# Patient Record
Sex: Female | Born: 1970
Health system: Southern US, Community
[De-identification: ages and names within clinical notes are randomized; demographics above are authoritative.]

## PROBLEM LIST (undated history)

## (undated) DIAGNOSIS — F32A Depression, unspecified: Secondary | ICD-10-CM

## (undated) DIAGNOSIS — F329 Major depressive disorder, single episode, unspecified: Secondary | ICD-10-CM

## (undated) DIAGNOSIS — F419 Anxiety disorder, unspecified: Secondary | ICD-10-CM

## (undated) DIAGNOSIS — K219 Gastro-esophageal reflux disease without esophagitis: Secondary | ICD-10-CM

## (undated) DIAGNOSIS — O139 Gestational [pregnancy-induced] hypertension without significant proteinuria, unspecified trimester: Secondary | ICD-10-CM

## (undated) DIAGNOSIS — N879 Dysplasia of cervix uteri, unspecified: Secondary | ICD-10-CM

## (undated) DIAGNOSIS — A64 Unspecified sexually transmitted disease: Secondary | ICD-10-CM

## (undated) DIAGNOSIS — S0990XA Unspecified injury of head, initial encounter: Secondary | ICD-10-CM

## (undated) DIAGNOSIS — B029 Zoster without complications: Secondary | ICD-10-CM

## (undated) DIAGNOSIS — N924 Excessive bleeding in the premenopausal period: Secondary | ICD-10-CM

## (undated) DIAGNOSIS — M069 Rheumatoid arthritis, unspecified: Secondary | ICD-10-CM

## (undated) DIAGNOSIS — G40909 Epilepsy, unspecified, not intractable, without status epilepticus: Secondary | ICD-10-CM

## (undated) HISTORY — DX: Unspecified injury of head, initial encounter: S09.90XA

## (undated) HISTORY — DX: Zoster without complications: B02.9

## (undated) HISTORY — PX: WISDOM TOOTH EXTRACTION: SHX21

## (undated) HISTORY — DX: Rheumatoid arthritis, unspecified: M06.9

## (undated) HISTORY — PX: BICEPS TENDON REPAIR: SHX566

## (undated) HISTORY — DX: Gastro-esophageal reflux disease without esophagitis: K21.9

## (undated) HISTORY — DX: Anxiety disorder, unspecified: F41.9

## (undated) HISTORY — DX: Unspecified sexually transmitted disease: A64

## (undated) HISTORY — PX: SHOULDER SURGERY: SHX246

## (undated) HISTORY — DX: Dysplasia of cervix uteri, unspecified: N87.9

---

## 1997-10-25 ENCOUNTER — Emergency Department (HOSPITAL_COMMUNITY): Admission: EM | Admit: 1997-10-25 | Discharge: 1997-10-25 | Payer: Self-pay | Admitting: Emergency Medicine

## 1998-01-07 ENCOUNTER — Inpatient Hospital Stay (HOSPITAL_COMMUNITY): Admission: AD | Admit: 1998-01-07 | Discharge: 1998-01-07 | Payer: Self-pay | Admitting: *Deleted

## 1998-01-14 ENCOUNTER — Inpatient Hospital Stay (HOSPITAL_COMMUNITY): Admission: AD | Admit: 1998-01-14 | Discharge: 1998-01-14 | Payer: Self-pay | Admitting: *Deleted

## 1998-09-22 ENCOUNTER — Encounter: Payer: Self-pay | Admitting: Emergency Medicine

## 1998-09-22 ENCOUNTER — Emergency Department (HOSPITAL_COMMUNITY): Admission: EM | Admit: 1998-09-22 | Discharge: 1998-09-22 | Payer: Self-pay | Admitting: Emergency Medicine

## 1999-06-03 ENCOUNTER — Encounter: Payer: Self-pay | Admitting: Emergency Medicine

## 1999-06-03 ENCOUNTER — Emergency Department (HOSPITAL_COMMUNITY): Admission: EM | Admit: 1999-06-03 | Discharge: 1999-06-03 | Payer: Self-pay | Admitting: Emergency Medicine

## 2001-05-21 ENCOUNTER — Emergency Department (HOSPITAL_COMMUNITY): Admission: EM | Admit: 2001-05-21 | Discharge: 2001-05-21 | Payer: Self-pay | Admitting: Emergency Medicine

## 2001-06-19 ENCOUNTER — Emergency Department (HOSPITAL_COMMUNITY): Admission: EM | Admit: 2001-06-19 | Discharge: 2001-06-19 | Payer: Self-pay | Admitting: Emergency Medicine

## 2002-08-16 ENCOUNTER — Emergency Department (HOSPITAL_COMMUNITY): Admission: EM | Admit: 2002-08-16 | Discharge: 2002-08-16 | Payer: Self-pay | Admitting: Emergency Medicine

## 2002-10-30 ENCOUNTER — Encounter: Payer: Self-pay | Admitting: Emergency Medicine

## 2002-10-30 ENCOUNTER — Emergency Department (HOSPITAL_COMMUNITY): Admission: EM | Admit: 2002-10-30 | Discharge: 2002-10-30 | Payer: Self-pay | Admitting: Emergency Medicine

## 2004-07-17 ENCOUNTER — Inpatient Hospital Stay (HOSPITAL_COMMUNITY): Admission: AD | Admit: 2004-07-17 | Discharge: 2004-07-17 | Payer: Self-pay | Admitting: *Deleted

## 2005-02-16 ENCOUNTER — Emergency Department (HOSPITAL_COMMUNITY): Admission: EM | Admit: 2005-02-16 | Discharge: 2005-02-16 | Payer: Self-pay | Admitting: Emergency Medicine

## 2007-01-21 ENCOUNTER — Inpatient Hospital Stay (HOSPITAL_COMMUNITY): Admission: AD | Admit: 2007-01-21 | Discharge: 2007-01-21 | Payer: Self-pay | Admitting: Family Medicine

## 2007-08-13 ENCOUNTER — Emergency Department (HOSPITAL_COMMUNITY): Admission: EM | Admit: 2007-08-13 | Discharge: 2007-08-13 | Payer: Self-pay | Admitting: Family Medicine

## 2008-05-17 ENCOUNTER — Emergency Department (HOSPITAL_COMMUNITY): Admission: EM | Admit: 2008-05-17 | Discharge: 2008-05-17 | Payer: Self-pay | Admitting: Emergency Medicine

## 2008-05-20 ENCOUNTER — Encounter: Admission: RE | Admit: 2008-05-20 | Discharge: 2008-05-20 | Payer: Self-pay | Admitting: Emergency Medicine

## 2008-07-17 ENCOUNTER — Emergency Department (HOSPITAL_COMMUNITY): Admission: EM | Admit: 2008-07-17 | Discharge: 2008-07-17 | Payer: Self-pay | Admitting: Emergency Medicine

## 2008-09-26 ENCOUNTER — Emergency Department (HOSPITAL_COMMUNITY): Admission: EM | Admit: 2008-09-26 | Discharge: 2008-09-26 | Payer: Self-pay | Admitting: Family Medicine

## 2010-04-23 ENCOUNTER — Encounter: Payer: Self-pay | Admitting: Emergency Medicine

## 2010-04-24 ENCOUNTER — Emergency Department (HOSPITAL_COMMUNITY)
Admission: EM | Admit: 2010-04-24 | Discharge: 2010-04-24 | Payer: Self-pay | Source: Home / Self Care | Admitting: Family Medicine

## 2010-04-24 ENCOUNTER — Encounter: Payer: Self-pay | Admitting: Family Medicine

## 2010-04-24 ENCOUNTER — Inpatient Hospital Stay (HOSPITAL_COMMUNITY)
Admission: AD | Admit: 2010-04-24 | Discharge: 2010-04-25 | Payer: Self-pay | Attending: Family Medicine | Admitting: Family Medicine

## 2010-04-25 LAB — DIFFERENTIAL
Lymphs Abs: 2.1 10*3/uL (ref 0.7–4.0)
Monocytes Absolute: 0.5 10*3/uL (ref 0.1–1.0)
Monocytes Relative: 8 % (ref 3–12)
Neutro Abs: 3.3 10*3/uL (ref 1.7–7.7)
Neutrophils Relative %: 55 % (ref 43–77)

## 2010-04-25 LAB — POCT URINALYSIS DIPSTICK
Bilirubin Urine: NEGATIVE
Specific Gravity, Urine: 1.02 (ref 1.005–1.030)
Urine Glucose, Fasting: NEGATIVE mg/dL
pH: 5 (ref 5.0–8.0)

## 2010-04-25 LAB — CBC
HCT: 36 % (ref 36.0–46.0)
Hemoglobin: 12.4 g/dL (ref 12.0–15.0)
MCH: 29.7 pg (ref 26.0–34.0)
MCHC: 34.4 g/dL (ref 30.0–36.0)
MCV: 86.3 fL (ref 78.0–100.0)
RBC: 4.17 MIL/uL (ref 3.87–5.11)

## 2010-04-25 LAB — WET PREP, GENITAL

## 2010-04-26 LAB — GC/CHLAMYDIA PROBE AMP, GENITAL
Chlamydia, DNA Probe: NEGATIVE
GC Probe Amp, Genital: NEGATIVE

## 2010-04-28 ENCOUNTER — Inpatient Hospital Stay (HOSPITAL_COMMUNITY)
Admission: AD | Admit: 2010-04-28 | Discharge: 2010-04-28 | Payer: Self-pay | Source: Home / Self Care | Attending: Obstetrics & Gynecology | Admitting: Obstetrics & Gynecology

## 2010-05-19 ENCOUNTER — Encounter (INDEPENDENT_AMBULATORY_CARE_PROVIDER_SITE_OTHER): Payer: Self-pay | Admitting: Obstetrics & Gynecology

## 2010-05-19 ENCOUNTER — Other Ambulatory Visit: Payer: Self-pay | Admitting: Obstetrics & Gynecology

## 2010-05-19 ENCOUNTER — Encounter: Payer: Self-pay | Admitting: Obstetrics & Gynecology

## 2010-05-19 DIAGNOSIS — D259 Leiomyoma of uterus, unspecified: Secondary | ICD-10-CM

## 2010-05-19 DIAGNOSIS — Z124 Encounter for screening for malignant neoplasm of cervix: Secondary | ICD-10-CM

## 2010-05-19 LAB — CONVERTED CEMR LAB
Trich, Wet Prep: NONE SEEN
Yeast Wet Prep HPF POC: NONE SEEN

## 2010-05-25 ENCOUNTER — Encounter: Payer: Self-pay | Admitting: Obstetrics and Gynecology

## 2010-06-23 ENCOUNTER — Ambulatory Visit (INDEPENDENT_AMBULATORY_CARE_PROVIDER_SITE_OTHER): Payer: Self-pay | Admitting: Obstetrics & Gynecology

## 2010-06-23 DIAGNOSIS — D259 Leiomyoma of uterus, unspecified: Secondary | ICD-10-CM

## 2010-06-23 NOTE — Progress Notes (Signed)
Patricia [name], [name]             ACCOUNT NO.:  [id number]  MEDICAL RECORD NO.:  [id number]           PATIENT TYPE:  A  LOCATION:  [facility]                   FACILITY:  [facility]  PHYSICIAN:  Allie Bossier, MD        DATE OF BIRTH:  1970/05/13  DATE OF SERVICE:  05/19/2010                                 CLINIC NOTE  PRIMARY REASON:  History of fibroids and abdominal pain.  HISTORY OF PRESENT ILLNESS:  [name] is a 41 year old G4, P4 with a past medical history that is significant for bilateral tubal C-section who is coming in with was abdominal pain.  The patient back in January 2011 did have an ultrasound done that did show that the patient had 4 focal fibroids measuring with 1.9 cm as the largest one in that as well as some diffuse heterogeneity throughout the uterus suggestive of adenomyosis.  The patient was in [facility] where she was given Percocet back in January as well as at that time, since then the patient has been doing very good.  The patient's history goes back several months saying that every month she is actually regular, still 28 days or so with bleeding for 3-5 days usually regular, no heavy bleeding.  The patient does says that her abdominal pain usually starts 4 days prior to her menstruation and has been having this discomfort for a while.  The patient was given some Motrin back in 600 mg tabs back in January and admits this has helped significantly.  The patient denies any type of fevers, chills, nausea, vomiting, any significant blood loss, dysuria, constipation, shortness of breath, or chest pain on review of systems.  PAST MEDICAL HISTORY:  History of fibroids.  OBSTETRICAL HISTORY:  The patient does have a history of Trichomonas back in 2010 as well as gonorrhea back in 2010, pregnancy-induced hypertension, history of abnormal Pap smear that the patient does not remember, but did have cryosurgery for it.  PAST SURGICAL HISTORY:  Significant for cesarean  section as well as bilateral tubal ligation.  SOCIAL HISTORY:  No drinking, former smoker, actually now smoking about half pack per day and cannabis abuse.  PHYSICAL EXAMINATION:  VITAL SIGNS:  Temperature of 98.7, pulse of 80, blood pressure 121/78, weight of 135 pounds, height 63 inches. GENERAL:  In no apparent distress.  Alert and oriented x3. HEENT:  Pupils equal, round, and reactive to light.  Extraocular movements intact. LUNGS:  Clear to auscultation bilaterally. HEART:  Regular rate and rhythm.  No murmurs. ABDOMEN:  Nontender.  No masses felt.  Bowel sounds present. BREASTS:  The patient does has a very small, less than 1-cm cyst felt in the 5 o'clock position.  Nontender, but mobile.  The patient has had this she states for approximately 2 years.  Had a mammogram done at that time, which was regular and routine mammogram testing is necessary from now. PELVIC:  Normal external female genitalia.  Vaginal vault does show yeast with white conization of the sidewalls, mild conization of the cervix, cervix seen.  Healthy pink tissue, no bleeding, Pap collected at that time. EXTREMITIES:  2+ distal pulses.  No  edema.  ASSESSMENT/PLAN:  [name] is a 40 year old G4, P4 with a history of fibroids causing some mild abdominal pain. 1. Fibroids.  The patient is actually well controlled with ibuprofen     and was given different plans and options at this time.  The     patient was given the option of a possible oral contraceptive to     help with this and the patient declined at this time and will do     the ibuprofen.  The patient was given red flags and what to look     for and will seek medical attention otherwise.  She will follow up     as necessary for this exam. 2. Yeast infection.  The patient was given Diflucan 150 mg x1 to take     care of that. 3. Tobacco abuse.  The patient was urged to quit smoking because there     could be increased risk of cervical as well as  endometrial cancer.     The patient stated that motivation was enough for her to quit. 4. Left-sided breast cyst.  The patient is scheduled for a mammogram,     this seems to be very benign in nature.  No red flags such as     weight loss or anything else other than her nipple discharge.  Due     to the patient not having any insurance, we will give the patient     medical cautions and give the patient a free mammogram.  FOLLOWUP APPOINTMENTS:  The patient is going to follow up as needed for any other problems involved.    ______________________________ Myrlene Broker   ______________________________ Allie Bossier, MD   LS/MEDQ  D:  05/19/2010  T:  05/20/2010  Job:  [id number]

## 2010-06-24 NOTE — Progress Notes (Signed)
NAMELATORSHA, CURLING             ACCOUNT NO.:  [id number]  MEDICAL RECORD NO.:  [id number]           PATIENT TYPE:  A  LOCATION:  [facility]                   FACILITY:  [facility]  PHYSICIAN:  Allie Bossier, MD        DATE OF BIRTH:  1971/03/07  DATE OF SERVICE:  06/23/2010                                 CLINIC NOTE  Patricia [name] is a 40 year old G4, P4 who was seen here last month for annual exam.  At that time, she complained of a "sticky, occasionally white vaginal discharge."  She denies any odor or associated pruritus. At the time, she assumed that she had a yeast infection.  A wet prep was done at that time and it was normal.  Her Pap smear has returned as normal.  I have explained to her the typical vaginal discharge as well as what vaginal discharge might be considered pathologic.  She is relieved and will plan on coming back here in a year for her next annual exam or sooner as necessary.     Allie Bossier, MD    MCD/MEDQ  D:  06/23/2010  T:  06/24/2010  Job:  [id number]

## 2010-06-29 ENCOUNTER — Ambulatory Visit: Payer: Self-pay | Admitting: Obstetrics & Gynecology

## 2010-08-25 ENCOUNTER — Other Ambulatory Visit: Payer: Self-pay | Admitting: Obstetrics & Gynecology

## 2010-08-25 DIAGNOSIS — Z1231 Encounter for screening mammogram for malignant neoplasm of breast: Secondary | ICD-10-CM

## 2010-09-05 ENCOUNTER — Ambulatory Visit (HOSPITAL_COMMUNITY)
Admission: RE | Admit: 2010-09-05 | Discharge: 2010-09-05 | Disposition: A | Discharge status: Home / Self Care | Payer: Self-pay | Source: Ambulatory Visit | Attending: Obstetrics & Gynecology | Admitting: Obstetrics & Gynecology

## 2010-09-05 DIAGNOSIS — Z1231 Encounter for screening mammogram for malignant neoplasm of breast: Secondary | ICD-10-CM

## 2011-01-10 LAB — DIFFERENTIAL
Basophils Absolute: 0
Basophils Relative: 1
Monocytes Relative: 9
Neutro Abs: 2.9
Neutrophils Relative %: 50

## 2011-01-10 LAB — URINALYSIS, ROUTINE W REFLEX MICROSCOPIC
Bilirubin Urine: NEGATIVE
Glucose, UA: NEGATIVE
Ketones, ur: NEGATIVE
Protein, ur: NEGATIVE

## 2011-01-10 LAB — GC/CHLAMYDIA PROBE AMP, GENITAL: Chlamydia, DNA Probe: NEGATIVE

## 2011-01-10 LAB — CBC
MCHC: 34.9
RBC: 4.07

## 2011-01-10 LAB — POCT PREGNANCY, URINE
Operator id: 113551
Preg Test, Ur: NEGATIVE

## 2011-01-10 LAB — URINE MICROSCOPIC-ADD ON

## 2011-01-10 LAB — WET PREP, GENITAL: Yeast Wet Prep HPF POC: NONE SEEN

## 2011-05-02 ENCOUNTER — Encounter (HOSPITAL_COMMUNITY): Payer: Self-pay

## 2011-05-02 ENCOUNTER — Inpatient Hospital Stay (HOSPITAL_COMMUNITY)
Admission: AD | Admit: 2011-05-02 | Discharge: 2011-05-02 | Disposition: A | Discharge status: Home / Self Care | Payer: Self-pay | Source: Ambulatory Visit | Attending: Obstetrics and Gynecology | Admitting: Obstetrics and Gynecology

## 2011-05-02 ENCOUNTER — Inpatient Hospital Stay (HOSPITAL_COMMUNITY): Payer: Self-pay

## 2011-05-02 DIAGNOSIS — D259 Leiomyoma of uterus, unspecified: Secondary | ICD-10-CM | POA: Insufficient documentation

## 2011-05-02 DIAGNOSIS — R102 Pelvic and perineal pain: Secondary | ICD-10-CM

## 2011-05-02 DIAGNOSIS — D219 Benign neoplasm of connective and other soft tissue, unspecified: Secondary | ICD-10-CM

## 2011-05-02 DIAGNOSIS — R1031 Right lower quadrant pain: Secondary | ICD-10-CM | POA: Insufficient documentation

## 2011-05-02 HISTORY — DX: Epilepsy, unspecified, not intractable, without status epilepticus: G40.909

## 2011-05-02 HISTORY — DX: Gestational (pregnancy-induced) hypertension without significant proteinuria, unspecified trimester: O13.9

## 2011-05-02 LAB — CBC
HCT: 37.4 % (ref 36.0–46.0)
Hemoglobin: 12.7 g/dL (ref 12.0–15.0)
MCH: 30 pg (ref 26.0–34.0)
MCHC: 34 g/dL (ref 30.0–36.0)
MCV: 88.2 fL (ref 78.0–100.0)
RDW: 12.5 % (ref 11.5–15.5)

## 2011-05-02 LAB — URINALYSIS, ROUTINE W REFLEX MICROSCOPIC
Bilirubin Urine: NEGATIVE
Glucose, UA: NEGATIVE mg/dL
Ketones, ur: NEGATIVE mg/dL
pH: 5.5 (ref 5.0–8.0)

## 2011-05-02 LAB — URINE MICROSCOPIC-ADD ON

## 2011-05-02 LAB — WET PREP, GENITAL

## 2011-05-02 MED ORDER — NAPROXEN SODIUM 550 MG PO TABS: 550.0000 mg | ORAL_TABLET | Freq: Two times a day (BID) | ORAL | Status: DC

## 2011-05-02 MED ORDER — KETOROLAC TROMETHAMINE 60 MG/2ML IM SOLN
60.0000 mg | Freq: Once | INTRAMUSCULAR | Status: AC
  Administered 2011-05-02: 60 mg via INTRAMUSCULAR
  Filled 2011-05-02: qty 2

## 2011-05-02 NOTE — ED Provider Notes (Signed)
History   Pt presents today c/o RLQ and lower abd pain that has worsened over the past 4 days. She denies fever, dysuria. Her last episode of intercourse was 4 days ago and was painful. She also reports vag spotting that began about 2 days ago.  Chief Complaint  Patient presents with  . Abdominal Pain   HPI  OB History    Grav Para Term Preterm Abortions TAB SAB Ect Mult Living   4 4 3 1      4       Past Medical History  Diagnosis Date  . Seizure disorder   . Pregnancy induced hypertension     Past Surgical History  Procedure Date  . Cesarean section   . Wisdom tooth extraction     Family History  Problem Relation Age of Onset  . Anesthesia problems Neg Hx     History  Substance Use Topics  . Smoking status: Former Games developer  . Smokeless tobacco: Never Used  . Alcohol Use: No    Allergies: No Known Allergies  Prescriptions prior to admission  Medication Sig Dispense Refill  . Multiple Vitamin (MULITIVITAMIN WITH MINERALS) TABS Take 1 tablet by mouth daily.      Marland Kitchen OVER THE COUNTER MEDICATION Take 1 tablet by mouth daily as needed. Patients said she was taken a pain reliever        Review of Systems  Constitutional: Negative for fever.  Eyes: Negative for blurred vision and double vision.  Respiratory: Negative for cough, hemoptysis, sputum production, shortness of breath and wheezing.   Cardiovascular: Negative for chest pain and palpitations.  Gastrointestinal: Positive for abdominal pain. Negative for nausea, vomiting, diarrhea and constipation.  Genitourinary: Negative for dysuria, urgency, frequency and hematuria.  Neurological: Negative for dizziness and headaches.  Psychiatric/Behavioral: Negative for depression and suicidal ideas.   Physical Exam   Blood pressure 143/87, pulse 78, temperature 97.9 F (36.6 C), temperature source Oral, resp. rate 20, last menstrual period 04/06/2011.  Physical Exam  Nursing note and vitals reviewed. Constitutional:  She is oriented to person, place, and time. She appears well-developed and well-nourished. No distress.  HENT:  Head: Normocephalic and atraumatic.  Eyes: EOM are normal. Pupils are equal, round, and reactive to light.  GI: Soft. She exhibits no distension and no mass. There is tenderness (Tenderness to both right and left lower quadrants.). There is guarding. There is no rebound.  Genitourinary: There is tenderness and bleeding around the vagina. Vaginal discharge found.       Uterus top normal size. Pt tender to both adnexa. No obvious masses but exam difficult secondary to pt discomfort.  Neurological: She is alert and oriented to person, place, and time.  Skin: Skin is warm and dry. She is not diaphoretic.  Psychiatric: She has a normal mood and affect. Her behavior is normal. Judgment and thought content normal.    MAU Course  Procedures  Wet prep and GC/Chlamydia cultures done.  Results for orders placed during the hospital encounter of 05/02/11 (from the past 24 hour(s))  URINALYSIS, ROUTINE W REFLEX MICROSCOPIC     Status: Abnormal   Collection Time   05/02/11  7:30 AM      Component Value Range   Color, Urine YELLOW  YELLOW    APPearance CLEAR  CLEAR    Specific Gravity, Urine >1.030 (*) 1.005 - 1.030    pH 5.5  5.0 - 8.0    Glucose, UA NEGATIVE  NEGATIVE (mg/dL)   Hgb urine  dipstick MODERATE (*) NEGATIVE    Bilirubin Urine NEGATIVE  NEGATIVE    Ketones, ur NEGATIVE  NEGATIVE (mg/dL)   Protein, ur NEGATIVE  NEGATIVE (mg/dL)   Urobilinogen, UA 0.2  0.0 - 1.0 (mg/dL)   Nitrite NEGATIVE  NEGATIVE    Leukocytes, UA NEGATIVE  NEGATIVE   URINE MICROSCOPIC-ADD ON     Status: Normal   Collection Time   05/02/11  7:30 AM      Component Value Range   Squamous Epithelial / LPF RARE  RARE    WBC, UA 0-2  <3 (WBC/hpf)   RBC / HPF 0-2  <3 (RBC/hpf)  POCT PREGNANCY, URINE     Status: Normal   Collection Time   05/02/11  7:39 AM      Component Value Range   Preg Test, Ur NEGATIVE   NEGATIVE   WET PREP, GENITAL     Status: Abnormal   Collection Time   05/02/11  8:05 AM      Component Value Range   Yeast Wet Prep HPF POC NONE SEEN  NONE SEEN    Trich, Wet Prep NONE SEEN  NONE SEEN    Clue Cells Wet Prep HPF POC MANY (*) NONE SEEN    WBC, Wet Prep HPF POC FEW (*) NONE SEEN   CBC     Status: Normal   Collection Time   05/02/11  9:10 AM      Component Value Range   WBC 4.4  4.0 - 10.5 (K/uL)   RBC 4.24  3.87 - 5.11 (MIL/uL)   Hemoglobin 12.7  12.0 - 15.0 (g/dL)   HCT 16.1  09.6 - 04.5 (%)   MCV 88.2  78.0 - 100.0 (fL)   MCH 30.0  26.0 - 34.0 (pg)   MCHC 34.0  30.0 - 36.0 (g/dL)   RDW 40.9  81.1 - 91.4 (%)   Platelets 264  150 - 400 (K/uL)   US Transvaginal Non-ob  05/02/2011  *RADIOLOGY REPORT*  Clinical Data: Pelvic pain.  The fibroids.  Negative pregnancy test.  TRANSABDOMINAL AND TRANSVAGINAL ULTRASOUND OF PELVIS  Technique:  Both transabdominal and transvaginal ultrasound examinations of the pelvis were performed.  Transabdominal technique was performed for global imaging of the pelvis including uterus, ovaries, adnexal regions, and pelvic cul-de-sac.  It was necessary to proceed with endovaginal exam following the transabdominal exam to visualize the individual fibroids, the endometrial stripe, and left ovary.  Comparison:  04/24/2010  Findings: Uterus:  9.9 x 5.1 x 5.5 cm.  At least five separate fibroids are demonstrated, which range in size from the 1.0 cm to 2.1 cm in maximum diameter. These fibroids show no significant change in size since previous study.  Two of these fibroids in the anterior fundus and mid posterior body have partial submucosal components which indent the endometrial stripe.  Endometrium: Measures 4 mm in thickness transvaginally.  Right ovary: 3.0 x 1.8 x 1.9 cm.  Normal appearance.  Left ovary: 3.4 x 1.4 x 1.9 cm.  Normal appearance.  Other Findings:  No free fluid  IMPRESSION:  1.  Multiple small uterine fibroids measuring up to 2 cm.  No  significant change seen compared with prior exam. 2.  Normal ovaries.  No evidence of adnexal mass or free fluid.  Original Report Authenticated By: Danae Orleans, M.D.   US Pelvis Complete  05/02/2011  *RADIOLOGY REPORT*  Clinical Data: Pelvic pain.  The fibroids.  Negative pregnancy test.  TRANSABDOMINAL AND TRANSVAGINAL ULTRASOUND OF PELVIS  Technique:  Both transabdominal and transvaginal ultrasound examinations of the pelvis were performed.  Transabdominal technique was performed for global imaging of the pelvis including uterus, ovaries, adnexal regions, and pelvic cul-de-sac.  It was necessary to proceed with endovaginal exam following the transabdominal exam to visualize the individual fibroids, the endometrial stripe, and left ovary.  Comparison:  04/24/2010  Findings: Uterus:  9.9 x 5.1 x 5.5 cm.  At least five separate fibroids are demonstrated, which range in size from the 1.0 cm to 2.1 cm in maximum diameter. These fibroids show no significant change in size since previous study.  Two of these fibroids in the anterior fundus and mid posterior body have partial submucosal components which indent the endometrial stripe.  Endometrium: Measures 4 mm in thickness transvaginally.  Right ovary: 3.0 x 1.8 x 1.9 cm.  Normal appearance.  Left ovary: 3.4 x 1.4 x 1.9 cm.  Normal appearance.  Other Findings:  No free fluid  IMPRESSION:  1.  Multiple small uterine fibroids measuring up to 2 cm.  No significant change seen compared with prior exam. 2.  Normal ovaries.  No evidence of adnexal mass or free fluid.  Original Report Authenticated By: Danae Orleans, M.D.   Pt pain is much improved following IM Toradol.  Assessment and Plan  Abd pain: discussed with pt at length. Pt likely having menses at this time. Will give Rx for anaprox ds. Discussed diet, activity, risks, and precautions.   Fibroids: discussed with pt at length. Pt understands that no tx is needed at this time and these may never give her any  problems. However, she is now aware. She will f/u with her PCP.  Clinton Gallant. Arn Mcomber III, DrHSc, MPAS, PA-C  05/02/2011, 8:09 AM   Henrietta Hoover, PA 05/02/11 0930

## 2011-05-02 NOTE — Progress Notes (Signed)
Patient states she has been having right lower abdominal pain for 4 days, sometimes radiates to the back on the right side. Has had spotting when wiping after urinating for two days.

## 2011-05-02 NOTE — Progress Notes (Signed)
Pt states no dysuria, has noted blood when she wipes x2 days, Denies abnormal vaginal d/c changes. LMP-04/06/2011, menstrual cycle prior was abnormal as well as January's. Mild CVA tenderness on r side.

## 2011-05-03 LAB — GC/CHLAMYDIA PROBE AMP, GENITAL: Chlamydia, DNA Probe: NEGATIVE

## 2011-05-04 NOTE — ED Provider Notes (Signed)
Agree with above note.  Patricia [name] 05/04/2011 5:48 AM

## 2011-06-06 ENCOUNTER — Encounter: Payer: Self-pay | Admitting: Obstetrics & Gynecology

## 2011-11-10 ENCOUNTER — Emergency Department (HOSPITAL_COMMUNITY)
Admission: EM | Admit: 2011-11-10 | Discharge: 2011-11-10 | Disposition: A | Discharge status: Home / Self Care | Payer: Self-pay | Attending: Emergency Medicine | Admitting: Emergency Medicine

## 2011-11-10 ENCOUNTER — Encounter (HOSPITAL_COMMUNITY): Payer: Self-pay | Admitting: Emergency Medicine

## 2011-11-10 DIAGNOSIS — R51 Headache: Secondary | ICD-10-CM | POA: Insufficient documentation

## 2011-11-10 DIAGNOSIS — F419 Anxiety disorder, unspecified: Secondary | ICD-10-CM

## 2011-11-10 DIAGNOSIS — Z87891 Personal history of nicotine dependence: Secondary | ICD-10-CM | POA: Insufficient documentation

## 2011-11-10 DIAGNOSIS — G40909 Epilepsy, unspecified, not intractable, without status epilepticus: Secondary | ICD-10-CM | POA: Insufficient documentation

## 2011-11-10 DIAGNOSIS — F411 Generalized anxiety disorder: Secondary | ICD-10-CM | POA: Insufficient documentation

## 2011-11-10 LAB — COMPREHENSIVE METABOLIC PANEL
ALT: 19 U/L (ref 0–35)
Albumin: 3.9 g/dL (ref 3.5–5.2)
Alkaline Phosphatase: 91 U/L (ref 39–117)
Calcium: 9.8 mg/dL (ref 8.4–10.5)
GFR calc Af Amer: >90 mL/min (ref >90)
Glucose, Bld: 103 mg/dL — ABNORMAL HIGH (ref 70–99)
Potassium: 3.9 mEq/L (ref 3.5–5.1)
Sodium: 137 mEq/L (ref 135–145)
Total Protein: 7.4 g/dL (ref 6.0–8.3)

## 2011-11-10 LAB — CBC WITH DIFFERENTIAL/PLATELET
Basophils Relative: 0 % (ref 0–1)
Eosinophils Absolute: 0.1 10*3/uL (ref 0.0–0.7)
Eosinophils Relative: 1 % (ref 0–5)
Lymphs Abs: 1.8 10*3/uL (ref 0.7–4.0)
MCH: 29.1 pg (ref 26.0–34.0)
MCHC: 33.5 g/dL (ref 30.0–36.0)
MCV: 86.8 fL (ref 78.0–100.0)
Neutrophils Relative %: 64 % (ref 43–77)
Platelets: 357 10*3/uL (ref 150–400)
RDW: 12.5 % (ref 11.5–15.5)

## 2011-11-10 MED ORDER — DIPHENHYDRAMINE HCL 50 MG/ML IJ SOLN
25.0000 mg | Freq: Once | INTRAMUSCULAR | Status: AC
  Administered 2011-11-10: 25 mg via INTRAVENOUS
  Filled 2011-11-10: qty 1

## 2011-11-10 MED ORDER — SODIUM CHLORIDE 0.9 % IV BOLUS (SEPSIS)
1000.0000 mL | Freq: Once | INTRAVENOUS | Status: AC
  Administered 2011-11-10: 1000 mL via INTRAVENOUS

## 2011-11-10 MED ORDER — METOCLOPRAMIDE HCL 5 MG/ML IJ SOLN
10.0000 mg | Freq: Once | INTRAMUSCULAR | Status: AC
  Administered 2011-11-10: 10 mg via INTRAVENOUS
  Filled 2011-11-10: qty 2

## 2011-11-10 NOTE — ED Provider Notes (Signed)
History     CSN: [id number]  Arrival date & time 11/10/11  1956   First MD Initiated Contact with Patient 11/10/11 2050      Chief Complaint  Patient presents with  . Headache    (Consider location/radiation/quality/duration/timing/severity/associated sxs/prior treatment) HPI Comments: Patricia [name] is a 41 y.o. Female otherwise healthy here presenting with headache and anxiety and diaphoresis. She states that she didn't have a job for a while and has been worried about getting a job. Since yesterday she feels that she is very sweaty and her blood pressure was elevated. She also has some diffuse headache and feels nauseous. Denies any blurry vision or vomiting. Denies any neck pain or fever. She also feels like she was going to pass out but didn't pass out. Not on OCP. No family hx of DVT/PE.    The history is provided by the patient and a friend.    Past Medical History  Diagnosis Date  . Seizure disorder   . Pregnancy induced hypertension     Past Surgical History  Procedure Date  . Cesarean section   . Wisdom tooth extraction     Family History  Problem Relation Age of Onset  . Anesthesia problems Neg Hx     History  Substance Use Topics  . Smoking status: Former Games developer  . Smokeless tobacco: Never Used  . Alcohol Use: No    OB History    Grav Para Term Preterm Abortions TAB SAB Ect Mult Living   4 4 3 1      4       Review of Systems  Neurological: Positive for headaches.       Lightheadedness  All other systems reviewed and are negative.    Allergies  Review of patient's allergies indicates no known allergies.  Home Medications   Current Outpatient Rx  Name Route Sig Dispense Refill  . ADULT MULTIVITAMIN W/MINERALS CH Oral Take 1 tablet by mouth daily.    Marland Kitchen NAPROXEN SODIUM 220 MG PO TABS Oral Take 220 mg by mouth 2 (two) times daily with a meal.      BP 143/89  Pulse 101  Temp 98.8 F (37.1 C) (Oral)  Resp 16  Ht 5\' 5"  (1.651 m)  Wt  150 lb (68.04 kg)  BMI 24.96 kg/m2  SpO2 100%  Physical Exam  Constitutional: She is oriented to person, place, and time.       Anxious, tearful.   HENT:  Head: Normocephalic.  Mouth/Throat: Oropharynx is clear and moist.  Eyes: Conjunctivae and EOM are normal. Pupils are equal, round, and reactive to light.  Neck: Normal range of motion. Neck supple.       No meningeal signs   Cardiovascular: Normal rate, regular rhythm and normal heart sounds.   Pulmonary/Chest: Effort normal and breath sounds normal.  Abdominal: Soft. Bowel sounds are normal.  Musculoskeletal: Normal range of motion.  Neurological: She is alert and oriented to person, place, and time.  Skin: Skin is warm.  Psychiatric: She has a normal mood and affect. Her behavior is normal. Judgment and thought content normal.    ED Course  Procedures (including critical care time)  Labs Reviewed  COMPREHENSIVE METABOLIC PANEL - Abnormal; Notable for the following:    Glucose, Bld 103 (*)     Total Bilirubin 0.2 (*)     All other components within normal limits  CBC WITH DIFFERENTIAL   No results found.   No diagnosis found.   Date:  11/10/2011  Rate: 83  Rhythm: normal sinus rhythm  QRS Axis: normal  Intervals: normal  ST/T Wave abnormalities: normal  Conduction Disutrbances:none  Narrative Interpretation:   Old EKG Reviewed: unchanged    MDM  Patricia [name] is a 41 y.o. female presenting with anxiety, HA. Will check basic labs, EKG. Her tachycardia is likely secondary to anxiety. She has no PE risk factors. Will treat symptomatically with reglan, benadryl, and IV fluids.   10:13 PM Her labs are nl, EKG nl. Her headache resolved and she felt better. She is safe for discharge. Family at bedside.          Richardean Canal, MD 11/10/11 2214

## 2011-11-10 NOTE — ED Notes (Signed)
MD at bedside. 

## 2011-11-10 NOTE — ED Notes (Signed)
Pt alert, nad, arrives from home, c/o headache, HTn, onset several days ago, resp even unlabored, skin pwd, speech clear, ambulates to triage

## 2011-11-10 NOTE — ED Notes (Signed)
Pt c/o headache that started 2 days ago and high blood pressure that started 3 days ago. Pt states she has been diagnosed with high blood pressure "off and on". Pt went to [facility] because she was feeling bad and sweating. Pt took Aleve and Tylenol.

## 2012-03-17 ENCOUNTER — Encounter (HOSPITAL_COMMUNITY): Payer: Self-pay | Admitting: *Deleted

## 2012-03-17 ENCOUNTER — Inpatient Hospital Stay (HOSPITAL_COMMUNITY)
Admission: AD | Admit: 2012-03-17 | Discharge: 2012-03-17 | Disposition: A | Discharge status: Home / Self Care | Payer: Self-pay | Source: Ambulatory Visit | Attending: Obstetrics & Gynecology | Admitting: Obstetrics & Gynecology

## 2012-03-17 DIAGNOSIS — N926 Irregular menstruation, unspecified: Secondary | ICD-10-CM | POA: Insufficient documentation

## 2012-03-17 DIAGNOSIS — R109 Unspecified abdominal pain: Secondary | ICD-10-CM | POA: Insufficient documentation

## 2012-03-17 DIAGNOSIS — N946 Dysmenorrhea, unspecified: Secondary | ICD-10-CM | POA: Insufficient documentation

## 2012-03-17 DIAGNOSIS — N939 Abnormal uterine and vaginal bleeding, unspecified: Secondary | ICD-10-CM

## 2012-03-17 DIAGNOSIS — D259 Leiomyoma of uterus, unspecified: Secondary | ICD-10-CM | POA: Insufficient documentation

## 2012-03-17 LAB — URINALYSIS, ROUTINE W REFLEX MICROSCOPIC
Bilirubin Urine: NEGATIVE
Glucose, UA: NEGATIVE mg/dL
Leukocytes, UA: NEGATIVE
Nitrite: NEGATIVE
Specific Gravity, Urine: 1.025 (ref 1.005–1.030)
pH: 5.5 (ref 5.0–8.0)

## 2012-03-17 LAB — WET PREP, GENITAL
Trich, Wet Prep: NONE SEEN
Yeast Wet Prep HPF POC: NONE SEEN

## 2012-03-17 LAB — URINE MICROSCOPIC-ADD ON

## 2012-03-17 LAB — TSH: TSH: 0.98 u[IU]/mL (ref 0.350–4.500)

## 2012-03-17 MED ORDER — IBUPROFEN 600 MG PO TABS: 600.0000 mg | ORAL_TABLET | Freq: Four times a day (QID) | ORAL | Status: DC | PRN

## 2012-03-17 NOTE — MAU Provider Note (Signed)
Attestation of Attending Supervision of Advanced Practitioner (CNM/NP): Evaluation and management procedures were performed by the Advanced Practitioner under my supervision and collaboration.  I have reviewed the Advanced Practitioner's note and chart, and I agree with the management and plan.  HARRAWAY-SMITH, Abdulaziz Toman 11:54 AM

## 2012-03-17 NOTE — MAU Provider Note (Signed)
CC: Abdominal Pain and Back Pain    First Provider Initiated Contact with Patient 03/17/12 1121      HPI Patricia [name] is a 41 y.o. [phone] who presents with onset yesterday of suprapubic crampy abdominal pain which was relieved by taking one of her sisters Vicodin. She also took a Vicodin this morning with some relief. Menses have always been regular until 3 months ago. Right spotting yesterday and this morning. No intermenstrual spotting or bleeding. Known to have small fibroids. No new sex partners. No irritative vaginal discharge.She states her LMP was 11/20/2011. Not using contraception but wants to start. Denies temperature intolerance, depression. Has gained weight recently.   Past Medical History  Diagnosis Date  . Seizure disorder   . Pregnancy induced hypertension     OB History    Grav Para Term Preterm Abortions TAB SAB Ect Mult Living   4 4 3 1      4      # Outc Date GA Lbr Len/2nd Wgt Sex Del Anes PTL Lv   1 TRM            2 TRM            3 TRM            4 PRE               Past Surgical History  Procedure Date  . Cesarean section   . Wisdom tooth extraction     History   Social History  . Marital Status: Single    Spouse Name: N/A    Number of Children: N/A  . Years of Education: N/A   Occupational History  . Not on file.   Social History Main Topics  . Smoking status: Former Games developer  . Smokeless tobacco: Never Used  . Alcohol Use: No  . Drug Use: Yes    Special: Marijuana  . Sexually Active: Yes    Birth Control/ Protection: None   Other Topics Concern  . Not on file   Social History Narrative  . No narrative on file    No current facility-administered medications on file prior to encounter.   Current Outpatient Prescriptions on File Prior to Encounter  Medication Sig Dispense Refill  . Multiple Vitamin (MULITIVITAMIN WITH MINERALS) TABS Take 1 tablet by mouth daily.        No Known Allergies  ROS Pertinent items in  HPI  PHYSICAL EXAM Filed Vitals:   03/17/12 0913  BP: 126/82  Pulse: 74  Temp: 97.8 F (36.6 C)  Resp: 18   General: Well nourished, well developed female in no acute distress, seems drowsy (s/p Vicodin taken before coming in) Cardiovascular: Normal rate Respiratory: Normal effort Abdomen: Soft, nontender Back: No CVAT Extremities: No edema Neurologic: Alert and oriented Speculum exam: NEFG; vagina with physiologic discharge, menstraul-like blood; cervix clean Bimanual exam: cervix closed, no CMT; uterus NSSP; no adnexal tenderness or masses  LAB RESULTS Results for orders placed during the hospital encounter of 03/17/12 (from the past 24 hour(s))  URINALYSIS, ROUTINE W REFLEX MICROSCOPIC     Status: Abnormal   Collection Time   03/17/12  9:29 AM      Component Value Range   Color, Urine YELLOW  YELLOW   APPearance CLEAR  CLEAR   Specific Gravity, Urine 1.025  1.005 - 1.030   pH 5.5  5.0 - 8.0   Glucose, UA NEGATIVE  NEGATIVE mg/dL   Hgb urine dipstick MODERATE (*) NEGATIVE  Bilirubin Urine NEGATIVE  NEGATIVE   Ketones, ur NEGATIVE  NEGATIVE mg/dL   Protein, ur NEGATIVE  NEGATIVE mg/dL   Urobilinogen, UA 0.2  0.0 - 1.0 mg/dL   Nitrite NEGATIVE  NEGATIVE   Leukocytes, UA NEGATIVE  NEGATIVE  URINE MICROSCOPIC-ADD ON     Status: Normal   Collection Time   03/17/12  9:29 AM      Component Value Range   Squamous Epithelial / LPF RARE  RARE   WBC, UA 0-2  <3 WBC/hpf   RBC / HPF 0-2  <3 RBC/hpf   Bacteria, UA RARE  RARE   Urine-Other MUCOUS PRESENT    POCT PREGNANCY, URINE     Status: Normal   Collection Time   03/17/12  9:54 AM      Component Value Range   Preg Test, Ur NEGATIVE  NEGATIVE    MAU Course: TSH sent  ASSESSMENT  1. Menstrual abnormality   2. Fibroid uterus   3. Dysmenorrhea   SecondaryAmenorrhea x 3 months, now starting menstrual period Needs contraception  PLAN Discharge home.  See AVS for patient education.  Follow-up Information     Follow up with [facility]. (someone from GYN clinic will call you with an appointment)    Contact information:   [address] [place] [address]           Medication List     As of 03/17/2012 11:47 AM    TAKE these medications         ibuprofen 600 MG tablet   Commonly known as: ADVIL,MOTRIN   Take 1 tablet (600 mg total) by mouth every 6 (six) hours as needed for pain.      multivitamin with minerals Tabs   Take 1 tablet by mouth daily.      VICODIN PO   Take 1 tablet by mouth every 6 (six) hours as needed. Takes for pain           Danae Orleans, CNM 03/17/2012 11:35 AM

## 2012-03-17 NOTE — MAU Note (Signed)
Lower abd & side pain since yesterday, radiates into back.  LMP was in August, unsure if pregnant.  Pt has bleeding with wiping this morning.

## 2012-03-17 NOTE — MAU Note (Signed)
Pt presents with abdominal pain that started yesterday, describes it as achy.  Pt took a pain pill that she got from her sister and has had some relief.  She sates not having a period since approximately 4months ago, but has has some spotting this morning.

## 2012-03-18 LAB — GC/CHLAMYDIA PROBE AMP: GC Probe RNA: NEGATIVE

## 2012-05-27 ENCOUNTER — Encounter (HOSPITAL_COMMUNITY): Payer: Self-pay | Admitting: *Deleted

## 2012-05-27 ENCOUNTER — Inpatient Hospital Stay (HOSPITAL_COMMUNITY)
Admission: AD | Admit: 2012-05-27 | Discharge: 2012-05-27 | Disposition: A | Discharge status: Home / Self Care | Payer: Self-pay | Source: Ambulatory Visit | Attending: Obstetrics & Gynecology | Admitting: Obstetrics & Gynecology

## 2012-05-27 DIAGNOSIS — A499 Bacterial infection, unspecified: Secondary | ICD-10-CM

## 2012-05-27 DIAGNOSIS — B9689 Other specified bacterial agents as the cause of diseases classified elsewhere: Secondary | ICD-10-CM | POA: Insufficient documentation

## 2012-05-27 DIAGNOSIS — N946 Dysmenorrhea, unspecified: Secondary | ICD-10-CM | POA: Insufficient documentation

## 2012-05-27 DIAGNOSIS — N949 Unspecified condition associated with female genital organs and menstrual cycle: Secondary | ICD-10-CM | POA: Insufficient documentation

## 2012-05-27 DIAGNOSIS — N390 Urinary tract infection, site not specified: Secondary | ICD-10-CM | POA: Insufficient documentation

## 2012-05-27 DIAGNOSIS — N760 Acute vaginitis: Secondary | ICD-10-CM | POA: Insufficient documentation

## 2012-05-27 LAB — CBC
MCV: 85.8 fL (ref 78.0–100.0)
Platelets: 290 10*3/uL (ref 150–400)
RBC: 4.22 MIL/uL (ref 3.87–5.11)
RDW: 12.7 % (ref 11.5–15.5)
WBC: 8.4 10*3/uL (ref 4.0–10.5)

## 2012-05-27 LAB — URINALYSIS, ROUTINE W REFLEX MICROSCOPIC
Ketones, ur: NEGATIVE mg/dL
Nitrite: NEGATIVE
Protein, ur: 30 mg/dL — AB
Urobilinogen, UA: 1 mg/dL (ref 0.0–1.0)

## 2012-05-27 LAB — URINE MICROSCOPIC-ADD ON

## 2012-05-27 LAB — WET PREP, GENITAL: Trich, Wet Prep: NONE SEEN

## 2012-05-27 MED ORDER — FLUCONAZOLE 150 MG PO TABS: 150.0000 mg | ORAL_TABLET | Freq: Once | ORAL | Status: DC

## 2012-05-27 MED ORDER — METRONIDAZOLE 500 MG PO TABS: 500.0000 mg | ORAL_TABLET | Freq: Two times a day (BID) | ORAL | Status: DC

## 2012-05-27 MED ORDER — SULFAMETHOXAZOLE-TMP DS 800-160 MG PO TABS: 1.0000 | ORAL_TABLET | Freq: Two times a day (BID) | ORAL | Status: DC

## 2012-05-27 NOTE — Progress Notes (Signed)
Pt states her periods are irregular, but pt does not feel this a period. Pt states her last period was in December

## 2012-05-27 NOTE — MAU Provider Note (Signed)
History     CSN: [id number]  Arrival date and time: 05/27/12 1642   First Provider Initiated Contact with Patient 05/27/12 1735      Chief Complaint  Patient presents with  . Vaginal pressure    HPI This is a 41 y.o. female who presents with c/o pain and pressure in lower pelvis and right side of abdomen. Just started bleeding after missing her January period. States has missed several months over past year. Denies fever, but might have felt feverish yesterday. Had frequency last night.   RN Note: Pt states no menses since last visit here 03/17/2012, staring to bleed now. Having vaginal pressure after voiding since yesterday evening around 2100. Inside of vagina is painful/irritated. No pain with voiding though. Denies abnormal vaginal d/c.   OB History   Grav Para Term Preterm Abortions TAB SAB Ect Mult Living   4 4 3 1      4       Past Medical History  Diagnosis Date  . Seizure disorder   . Pregnancy induced hypertension     Past Surgical History  Procedure Laterality Date  . Cesarean section    . Wisdom tooth extraction      Family History  Problem Relation Age of Onset  . Anesthesia problems Neg Hx   . Alcohol abuse Neg Hx   . Arthritis Neg Hx   . Asthma Neg Hx   . Birth defects Neg Hx   . Cancer Neg Hx   . COPD Neg Hx   . Depression Neg Hx   . Diabetes Neg Hx   . Drug abuse Neg Hx   . Early death Neg Hx   . Hearing loss Neg Hx   . Heart disease Neg Hx   . Hyperlipidemia Neg Hx   . Hypertension Neg Hx   . Kidney disease Neg Hx   . Learning disabilities Neg Hx   . Mental illness Neg Hx   . Mental retardation Neg Hx   . Miscarriages / Stillbirths Neg Hx   . Stroke Neg Hx   . Vision loss Neg Hx     History  Substance Use Topics  . Smoking status: Former Games developer  . Smokeless tobacco: Never Used  . Alcohol Use: No    Allergies: No Known Allergies  Prescriptions prior to admission  Medication Sig Dispense Refill  . ibuprofen (ADVIL,MOTRIN) 600  MG tablet Take 1 tablet (600 mg total) by mouth every 6 (six) hours as needed for pain.  30 tablet  1  . Multiple Vitamin (MULITIVITAMIN WITH MINERALS) TABS Take 1 tablet by mouth daily.        Review of Systems  Constitutional: Negative for fever, chills and malaise/fatigue.  Gastrointestinal: Positive for abdominal pain. Negative for nausea, vomiting, diarrhea and constipation.  Genitourinary: Positive for dysuria and frequency. Negative for flank pain.  Neurological: Negative for headaches.   Physical Exam   Blood pressure 126/81, pulse 77, temperature 98.2 F (36.8 C), temperature source Oral, resp. rate 16, height 5\' 5"  (1.651 m), weight 163 lb 6 oz (74.106 kg), last menstrual period 05/27/2012.  Physical Exam  Constitutional: She is oriented to person, place, and time. She appears well-developed and well-nourished. No distress.  HENT:  Head: Normocephalic.  Cardiovascular: Normal rate.   Respiratory: Effort normal.  GI: Soft. She exhibits no distension and no mass. There is tenderness (over suprapubic area, and RLQ). There is no rebound and no guarding.  Genitourinary: Uterus normal. Vaginal discharge (white, no  blood) found.  No cervical motion tenderness  Musculoskeletal: Normal range of motion.  Neurological: She is alert and oriented to person, place, and time.  Skin: Skin is warm and dry.  Psychiatric: She has a normal mood and affect.   Results for orders placed during the hospital encounter of 05/27/12 (from the past 24 hour(s))  URINALYSIS, ROUTINE W REFLEX MICROSCOPIC     Status: Abnormal   Collection Time    05/27/12  4:54 PM      Result Value Range   Color, Urine YELLOW  YELLOW   APPearance CLOUDY (*) CLEAR   Specific Gravity, Urine 1.015  1.005 - 1.030   pH 7.0  5.0 - 8.0   Glucose, UA NEGATIVE  NEGATIVE mg/dL   Hgb urine dipstick LARGE (*) NEGATIVE   Bilirubin Urine NEGATIVE  NEGATIVE   Ketones, ur NEGATIVE  NEGATIVE mg/dL   Protein, ur 30 (*) NEGATIVE  mg/dL   Urobilinogen, UA 1.0  0.0 - 1.0 mg/dL   Nitrite NEGATIVE  NEGATIVE   Leukocytes, UA MODERATE (*) NEGATIVE  URINE MICROSCOPIC-ADD ON     Status: Abnormal   Collection Time    05/27/12  4:54 PM      Result Value Range   Squamous Epithelial / LPF FEW (*) RARE   WBC, UA 21-50  <3 WBC/hpf   RBC / HPF 21-50  <3 RBC/hpf   Bacteria, UA FEW (*) RARE  POCT PREGNANCY, URINE     Status: None   Collection Time    05/27/12  5:19 PM      Result Value Range   Preg Test, Ur NEGATIVE  NEGATIVE    MAU Course  Procedures  MDM Will check CBC to r/o appy, check urine culture (probable UTI).  Wet prep and cultures collected Results for orders placed during the hospital encounter of 05/27/12 (from the past 24 hour(s))  URINALYSIS, ROUTINE W REFLEX MICROSCOPIC     Status: Abnormal   Collection Time    05/27/12  4:54 PM      Result Value Range   Color, Urine YELLOW  YELLOW   APPearance CLOUDY (*) CLEAR   Specific Gravity, Urine 1.015  1.005 - 1.030   pH 7.0  5.0 - 8.0   Glucose, UA NEGATIVE  NEGATIVE mg/dL   Hgb urine dipstick LARGE (*) NEGATIVE   Bilirubin Urine NEGATIVE  NEGATIVE   Ketones, ur NEGATIVE  NEGATIVE mg/dL   Protein, ur 30 (*) NEGATIVE mg/dL   Urobilinogen, UA 1.0  0.0 - 1.0 mg/dL   Nitrite NEGATIVE  NEGATIVE   Leukocytes, UA MODERATE (*) NEGATIVE  URINE MICROSCOPIC-ADD ON     Status: Abnormal   Collection Time    05/27/12  4:54 PM      Result Value Range   Squamous Epithelial / LPF FEW (*) RARE   WBC, UA 21-50  <3 WBC/hpf   RBC / HPF 21-50  <3 RBC/hpf   Bacteria, UA FEW (*) RARE  POCT PREGNANCY, URINE     Status: None   Collection Time    05/27/12  5:19 PM      Result Value Range   Preg Test, Ur NEGATIVE  NEGATIVE  WET PREP, GENITAL     Status: Abnormal   Collection Time    05/27/12  5:47 PM      Result Value Range   Yeast Wet Prep HPF POC NONE SEEN  NONE SEEN   Trich, Wet Prep NONE SEEN  NONE SEEN   Clue  Cells Wet Prep HPF POC MODERATE (*) NONE SEEN   WBC,  Wet Prep HPF POC FEW (*) NONE SEEN  CBC     Status: None   Collection Time    05/27/12  6:25 PM      Result Value Range   WBC 8.4  4.0 - 10.5 K/uL   RBC 4.22  3.87 - 5.11 MIL/uL   Hemoglobin 12.1  12.0 - 15.0 g/dL   HCT 16.1  09.6 - 04.5 %   MCV 85.8  78.0 - 100.0 fL   MCH 28.7  26.0 - 34.0 pg   MCHC 33.4  30.0 - 36.0 g/dL   RDW 40.9  81.1 - 91.4 %   Platelets 290  150 - 400 K/uL    Assessment and Plan  A:   Pelvic pain, no leukocytosis       UTI       Bacterial Vaginosis      Possible dysmenorrhea with oncoming menses  P:  Discharge home       Rx Flagyl and Septra DS       Rx Diflucan PRN yeast       Follow up with Dr Marice Potter in clinic PRN       If pelvic pain does not improve in a week, will need to reexamine   [facility] 05/27/2012, 5:49 PM

## 2012-05-27 NOTE — MAU Note (Signed)
Pt states she noticed vaginal pressure last night about 2100. Pt admits to pain, that she describes as a pressure pain

## 2012-05-27 NOTE — MAU Note (Signed)
Pt states no menses since last visit here 03/17/2012, staring to bleed now. Having vaginal pressure after voiding since yesterday evening around 2100. Inside of vagina is painful/irritated. No pain with voiding though. Denies abnormal vaginal d/c.

## 2012-05-27 NOTE — MAU Provider Note (Signed)
Attestation of Attending Supervision of Advanced Practitioner (CNM/NP): Evaluation and management procedures were performed by the Advanced Practitioner under my supervision and collaboration.  I have reviewed the Advanced Practitioner's note and chart, and I agree with the management and plan.  HARRAWAY-SMITH, Benz Vandenberghe 11:09 PM

## 2012-05-28 LAB — GC/CHLAMYDIA PROBE AMP
CT Probe RNA: NEGATIVE
GC Probe RNA: NEGATIVE

## 2012-05-29 LAB — URINE CULTURE: Colony Count: >=100000

## 2012-05-30 ENCOUNTER — Other Ambulatory Visit: Payer: Self-pay | Admitting: Advanced Practice Midwife

## 2012-05-30 MED ORDER — CEPHALEXIN 500 MG PO CAPS: 500.0000 mg | ORAL_CAPSULE | Freq: Three times a day (TID) | ORAL | Status: DC

## 2012-05-30 NOTE — Progress Notes (Signed)
Patient ID: Patricia [name], female   DOB: 01-Feb-1971, 42 y.o.   MRN: [id number] Pt seen in MAU on 2/25 with UTI, given rx for Bactrim. Culture shows e coli, resistant to bactrim. Will send rx for Keflex 500 mg 1 po tid x 7 days. Attempted to call all three numbers listed, either invalid numbers or patient not there.

## 2013-01-24 ENCOUNTER — Encounter (HOSPITAL_COMMUNITY): Payer: Self-pay | Admitting: Emergency Medicine

## 2013-01-24 ENCOUNTER — Emergency Department (HOSPITAL_COMMUNITY)
Admission: EM | Admit: 2013-01-24 | Discharge: 2013-01-24 | Disposition: A | Discharge status: Home / Self Care | Payer: Self-pay | Attending: Emergency Medicine | Admitting: Emergency Medicine

## 2013-01-24 DIAGNOSIS — Z8742 Personal history of other diseases of the female genital tract: Secondary | ICD-10-CM | POA: Insufficient documentation

## 2013-01-24 DIAGNOSIS — R5381 Other malaise: Secondary | ICD-10-CM | POA: Insufficient documentation

## 2013-01-24 DIAGNOSIS — R569 Unspecified convulsions: Secondary | ICD-10-CM | POA: Insufficient documentation

## 2013-01-24 DIAGNOSIS — R209 Unspecified disturbances of skin sensation: Secondary | ICD-10-CM | POA: Insufficient documentation

## 2013-01-24 DIAGNOSIS — F17200 Nicotine dependence, unspecified, uncomplicated: Secondary | ICD-10-CM | POA: Insufficient documentation

## 2013-01-24 DIAGNOSIS — R220 Localized swelling, mass and lump, head: Secondary | ICD-10-CM | POA: Insufficient documentation

## 2013-01-24 DIAGNOSIS — K047 Periapical abscess without sinus: Secondary | ICD-10-CM | POA: Insufficient documentation

## 2013-01-24 MED ORDER — CLARITHROMYCIN 125 MG/5ML PO SUSR: 300.0000 mg | Freq: Two times a day (BID) | ORAL | Status: DC

## 2013-01-24 MED ORDER — HYDROCODONE-ACETAMINOPHEN 7.5-325 MG/15ML PO SOLN: 30.0000 mL | Freq: Four times a day (QID) | ORAL | Status: DC | PRN

## 2013-01-24 MED ORDER — FAMOTIDINE 20 MG PO TABS
20.0000 mg | ORAL_TABLET | Freq: Once | ORAL | Status: AC
  Administered 2013-01-24: 20 mg via ORAL
  Filled 2013-01-24: qty 1

## 2013-01-24 MED ORDER — PREDNISONE 20 MG PO TABS
60.0000 mg | ORAL_TABLET | Freq: Once | ORAL | Status: AC
  Administered 2013-01-24: 60 mg via ORAL
  Filled 2013-01-24: qty 3

## 2013-01-24 MED ORDER — LORATADINE 10 MG PO TABS
10.0000 mg | ORAL_TABLET | Freq: Once | ORAL | Status: AC
  Administered 2013-01-24: 10 mg via ORAL
  Filled 2013-01-24: qty 1

## 2013-01-24 NOTE — ED Provider Notes (Signed)
CSN: [id number]     Arrival date & time 01/24/13  1536 History  This chart was scribed for non-physician practitioner Marlon Pel, PA-C, working with Roney Marion, MD by Dorothey Baseman, ED Scribe. This patient was seen in room TR06C/TR06C and the patient's care was started at 4:29 PM.    Chief Complaint  Patient presents with  . Allergic Reaction   The history is provided by the patient. No language interpreter was used.   HPI Comments: Patricia [name] is a 42 y.o. female who presents to the Emergency Department complaining of an allergic reaction onset 5 days ago when she states that she took a friend's antibiotics to treat some cold-like symptoms. She reports associated facial swelling, pain, numbness and paresthesias to the area. Patient reports some associated fatigue that she states has been gradually improving. She states that she applied ice and took Benadryl and ibuprofen at home without relief. She denies history of injuries to the mouth. Patient denies any trouble swallowing, abnormal tastes or secretions in the mouth, or shortness of breath. She denies any allergies to medications. Patient denies any other pertinent medical history.   Past Medical History  Diagnosis Date  . Seizure disorder   . Pregnancy induced hypertension    Past Surgical History  Procedure Laterality Date  . Cesarean section    . Wisdom tooth extraction     Family History  Problem Relation Age of Onset  . Anesthesia problems Neg Hx   . Alcohol abuse Neg Hx   . Arthritis Neg Hx   . Asthma Neg Hx   . Birth defects Neg Hx   . Cancer Neg Hx   . COPD Neg Hx   . Depression Neg Hx   . Diabetes Neg Hx   . Drug abuse Neg Hx   . Early death Neg Hx   . Hearing loss Neg Hx   . Heart disease Neg Hx   . Hyperlipidemia Neg Hx   . Hypertension Neg Hx   . Kidney disease Neg Hx   . Learning disabilities Neg Hx   . Mental illness Neg Hx   . Mental retardation Neg Hx   . Miscarriages / Stillbirths Neg Hx   .  Stroke Neg Hx   . Vision loss Neg Hx    History  Substance Use Topics  . Smoking status: Current Some Day Smoker  . Smokeless tobacco: Never Used  . Alcohol Use: No   OB History   Grav Para Term Preterm Abortions TAB SAB Ect Mult Living   4 4 3 1      4      Review of Systems  Constitutional: Positive for fatigue.  HENT: Positive for facial swelling. Negative for trouble swallowing.   Respiratory: Negative for shortness of breath.   Neurological: Positive for numbness.    Allergies  Review of patient's allergies indicates no known allergies.  Home Medications   Current Outpatient Rx  Name  Route  Sig  Dispense  Refill  . ibuprofen (ADVIL,MOTRIN) 600 MG tablet   Oral   Take 1 tablet (600 mg total) by mouth every 6 (six) hours as needed for pain.   30 tablet   1   . Multiple Vitamin (MULITIVITAMIN WITH MINERALS) TABS   Oral   Take 1 tablet by mouth daily.         . clarithromycin (BIAXIN) 125 MG/5ML suspension   Oral   Take 12 mLs (300 mg total) by mouth 2 (two) times  daily.   175 mL   0   . HYDROcodone-acetaminophen (HYCET) 7.5-325 mg/15 ml solution   Oral   Take 30 mLs by mouth 4 (four) times daily as needed for pain.   120 mL   0    Triage Vitals: BP 148/96  Pulse 120  Temp(Src) 99.3 F (37.4 C) (Oral)  Ht 5\' 4"  (1.626 m)  Wt 140 lb 2 oz (63.56 kg)  BMI 24.04 kg/m2  SpO2 97%  Physical Exam  Nursing note and vitals reviewed. Constitutional: She is oriented to person, place, and time. She appears well-developed and well-nourished. No distress.  HENT:  Head: Normocephalic and atraumatic.    Abscess to the right, lower gingiva with associated signs of infection.   Eyes: Conjunctivae are normal.  Neck: Normal range of motion. Neck supple.  Pulmonary/Chest: Effort normal. No respiratory distress.  Abdominal: She exhibits no distension.  Musculoskeletal: Normal range of motion.  Neurological: She is alert and oriented to person, place, and time.   Skin: Skin is warm and dry.  Psychiatric: She has a normal mood and affect. Her behavior is normal.    ED Course  Procedures (including critical care time)  DIAGNOSTIC STUDIES: Oxygen Saturation is 97% on room air, normal by my interpretation.    COORDINATION OF CARE: 4:34 PM- Discussed that some of the symptoms may be due to the abscess to the right, lower gingiva. Discussed that symptoms do not appear to be due to an allergic reaction. Will discharge patient with liquid Clindamycin and pain medication to manage symptoms. Advised patient to follow up with the referred dentist or to return if there are any new or worsening symptoms. Discussed treatment plan with patient at bedside and patient verbalized agreement.     Labs Review Labs Reviewed - No data to display Imaging Review No results found.  EKG Interpretation   None       MDM   1. Dental abscess    Patient has dental pain. No emergent s/sx's present. Patent airway. No trismus.  Will be given pain medication and antibiotics. I discussed the need to call dentist within 24/48 hours for follow-up. Dental referral given. Return to ED precautions given.  Pt voiced understanding and has agreed to follow-up.   I personally performed the services described in this documentation, which was scribed in my presence. The recorded information has been reviewed and is accurate.    Dorthula Matas, PA-C 01/24/13 1656

## 2013-01-24 NOTE — ED Notes (Addendum)
Pt reports she had cold symptoms earlier this week so she took an antibiotic pill from a friend on Tuesday. Immediately afterwards she felt tingling in her lips and her lips have been swollen since. Applied ice with no relief. No trouble breathing or swallowing. Also took a benadryl with no relief

## 2013-01-30 NOTE — ED Provider Notes (Signed)
Medical screening examination/treatment/procedure(s) were performed by non-physician practitioner and as supervising physician I was immediately available for consultation/collaboration.  EKG Interpretation   None         Roney Marion, MD 01/30/13 2006

## 2013-05-05 ENCOUNTER — Emergency Department (HOSPITAL_COMMUNITY)
Admission: EM | Admit: 2013-05-05 | Discharge: 2013-05-05 | Disposition: A | Discharge status: Home / Self Care | Payer: Self-pay | Attending: Emergency Medicine | Admitting: Emergency Medicine

## 2013-05-05 ENCOUNTER — Encounter (HOSPITAL_COMMUNITY): Payer: Self-pay | Admitting: Emergency Medicine

## 2013-05-05 DIAGNOSIS — G40909 Epilepsy, unspecified, not intractable, without status epilepticus: Secondary | ICD-10-CM | POA: Insufficient documentation

## 2013-05-05 DIAGNOSIS — K2970 Gastritis, unspecified, without bleeding: Secondary | ICD-10-CM | POA: Insufficient documentation

## 2013-05-05 DIAGNOSIS — F17200 Nicotine dependence, unspecified, uncomplicated: Secondary | ICD-10-CM | POA: Insufficient documentation

## 2013-05-05 DIAGNOSIS — K2990 Gastroduodenitis, unspecified, without bleeding: Principal | ICD-10-CM

## 2013-05-05 DIAGNOSIS — Z79899 Other long term (current) drug therapy: Secondary | ICD-10-CM | POA: Insufficient documentation

## 2013-05-05 LAB — COMPREHENSIVE METABOLIC PANEL
ALK PHOS: 80 U/L (ref 39–117)
ALT: 9 U/L (ref 0–35)
AST: 14 U/L (ref 0–37)
Albumin: 3.9 g/dL (ref 3.5–5.2)
BUN: 9 mg/dL (ref 6–23)
CO2: 26 meq/L (ref 19–32)
Calcium: 9.2 mg/dL (ref 8.4–10.5)
Chloride: 101 mEq/L (ref 96–112)
Creatinine, Ser: 0.52 mg/dL (ref 0.50–1.10)
GLUCOSE: 110 mg/dL — AB (ref 70–99)
POTASSIUM: 3.7 meq/L (ref 3.7–5.3)
SODIUM: 139 meq/L (ref 137–147)
Total Bilirubin: 0.4 mg/dL (ref 0.3–1.2)
Total Protein: 7.3 g/dL (ref 6.0–8.3)

## 2013-05-05 LAB — CBC WITH DIFFERENTIAL/PLATELET
Basophils Absolute: 0 10*3/uL (ref 0.0–0.1)
Basophils Relative: 0 % (ref 0–1)
Eosinophils Absolute: 0.1 10*3/uL (ref 0.0–0.7)
Eosinophils Relative: 2 % (ref 0–5)
HCT: 36.1 % (ref 36.0–46.0)
Hemoglobin: 12.4 g/dL (ref 12.0–15.0)
Lymphocytes Relative: 29 % (ref 12–46)
Lymphs Abs: 1.8 10*3/uL (ref 0.7–4.0)
MCH: 29.6 pg (ref 26.0–34.0)
MCHC: 34.3 g/dL (ref 30.0–36.0)
MCV: 86.2 fL (ref 78.0–100.0)
Monocytes Absolute: 0.4 10*3/uL (ref 0.1–1.0)
Monocytes Relative: 6 % (ref 3–12)
Neutro Abs: 3.9 10*3/uL (ref 1.7–7.7)
Neutrophils Relative %: 63 % (ref 43–77)
Platelets: 285 10*3/uL (ref 150–400)
RBC: 4.19 MIL/uL (ref 3.87–5.11)
RDW: 12.8 % (ref 11.5–15.5)
WBC: 6.3 10*3/uL (ref 4.0–10.5)

## 2013-05-05 LAB — TROPONIN I: Troponin I: <0.3 ng/mL

## 2013-05-05 MED ORDER — FAMOTIDINE 20 MG PO TABS: 20.0000 mg | ORAL_TABLET | Freq: Two times a day (BID) | ORAL | Status: DC

## 2013-05-05 MED ORDER — PROMETHAZINE HCL 25 MG PO TABS: 25.0000 mg | ORAL_TABLET | Freq: Four times a day (QID) | ORAL | Status: DC | PRN

## 2013-05-05 MED ORDER — SODIUM CHLORIDE 0.9 % IV BOLUS (SEPSIS)
1000.0000 mL | Freq: Once | INTRAVENOUS | Status: AC
  Administered 2013-05-05: 1000 mL via INTRAVENOUS

## 2013-05-05 MED ORDER — ONDANSETRON HCL 4 MG/2ML IJ SOLN
4.0000 mg | Freq: Once | INTRAMUSCULAR | Status: AC
  Administered 2013-05-05: 4 mg via INTRAVENOUS
  Filled 2013-05-05: qty 2

## 2013-05-05 MED ORDER — MORPHINE SULFATE 4 MG/ML IJ SOLN
4.0000 mg | Freq: Once | INTRAMUSCULAR | Status: AC
  Administered 2013-05-05: 4 mg via INTRAVENOUS
  Filled 2013-05-05: qty 1

## 2013-05-05 MED ORDER — GI COCKTAIL ~~LOC~~
30.0000 mL | Freq: Once | ORAL | Status: AC
  Administered 2013-05-05: 30 mL via ORAL
  Filled 2013-05-05: qty 30

## 2013-05-05 NOTE — ED Notes (Signed)
Pt alert x4 ambulates without distress. 

## 2013-05-05 NOTE — ED Provider Notes (Signed)
CSN: [id number]     Arrival date & time 05/05/13  0015 History   First MD Initiated Contact with Patient 05/05/13 0038     Chief Complaint  Patient presents with  . Chest Pain   (Consider location/radiation/quality/duration/timing/severity/associated sxs/prior Treatment) HPI Comments: Patient with 2 days of constant epigastric pain occasional vomiting  Eating causes pain to increase Has been taking Mylanta without relief    Patient is a 43 y.o. female presenting with chest pain. The history is provided by the patient.  Chest Pain Pain location:  Epigastric Pain quality: aching   Pain quality: not radiating   Pain radiates to:  Does not radiate Pain radiates to the back: no   Pain severity:  Moderate Onset quality:  Unable to specify Duration:  2 days Timing:  Constant Progression:  Unchanged Chronicity:  New Context: at rest   Context: not breathing, not eating, not lifting and no movement   Relieved by:  None tried Worsened by:  Nothing tried Ineffective treatments:  None tried Associated symptoms: nausea and vomiting   Associated symptoms: no back pain, no cough and no diaphoresis   Nausea:    Severity:  Moderate   Onset quality:  Unable to specify   Timing:  Intermittent   Past Medical History  Diagnosis Date  . Seizure disorder   . Pregnancy induced hypertension    Past Surgical History  Procedure Laterality Date  . Cesarean section    . Wisdom tooth extraction     Family History  Problem Relation Age of Onset  . Anesthesia problems Neg Hx   . Alcohol abuse Neg Hx   . Arthritis Neg Hx   . Asthma Neg Hx   . Birth defects Neg Hx   . Cancer Neg Hx   . COPD Neg Hx   . Depression Neg Hx   . Diabetes Neg Hx   . Drug abuse Neg Hx   . Early death Neg Hx   . Hearing loss Neg Hx   . Heart disease Neg Hx   . Hyperlipidemia Neg Hx   . Hypertension Neg Hx   . Kidney disease Neg Hx   . Learning disabilities Neg Hx   . Mental illness Neg Hx   . Mental  retardation Neg Hx   . Miscarriages / Stillbirths Neg Hx   . Stroke Neg Hx   . Vision loss Neg Hx    History  Substance Use Topics  . Smoking status: Current Some Day Smoker  . Smokeless tobacco: Never Used  . Alcohol Use: No   OB History   Grav Para Term Preterm Abortions TAB SAB Ect Mult Living   4 4 3 1      4      Review of Systems  Constitutional: Negative for diaphoresis.  HENT: Negative for sore throat.   Respiratory: Negative for cough.   Cardiovascular: Positive for chest pain.  Gastrointestinal: Positive for nausea and vomiting.  Genitourinary: Negative for dysuria.  Musculoskeletal: Negative for back pain.  Skin: Negative for rash.  All other systems reviewed and are negative.    Allergies  Review of patient's allergies indicates no known allergies.  Home Medications   Current Outpatient Rx  Name  Route  Sig  Dispense  Refill  . ibuprofen (ADVIL,MOTRIN) 600 MG tablet   Oral   Take 1 tablet (600 mg total) by mouth every 6 (six) hours as needed for pain.   30 tablet   1   . famotidine (PEPCID)  20 MG tablet   Oral   Take 1 tablet (20 mg total) by mouth 2 (two) times daily.   30 tablet   0   . promethazine (PHENERGAN) 25 MG tablet   Oral   Take 1 tablet (25 mg total) by mouth every 6 (six) hours as needed for nausea.   20 tablet   0    BP 120/82  Pulse 54  Temp(Src) 97.7 F (36.5 C) (Oral)  Resp 23  Ht 5\' 5"  (1.651 m)  Wt 140 lb (63.504 kg)  BMI 23.30 kg/m2  SpO2 100%  LMP 04/02/2013 Physical Exam  Constitutional: She appears well-developed and well-nourished. No distress.  HENT:  Head: Normocephalic and atraumatic.  Mouth/Throat: Oropharynx is clear and moist.  Eyes: Pupils are equal, round, and reactive to light.  Neck: Normal range of motion.  Cardiovascular: Normal rate and regular rhythm.   Pulmonary/Chest: Effort normal and breath sounds normal.  Abdominal: Soft. She exhibits no distension. There is tenderness.     Musculoskeletal: Normal range of motion.  Neurological: She is alert.  Skin: Skin is warm. No erythema.    ED Course  Procedures (including critical care time) Labs Review Labs Reviewed  COMPREHENSIVE METABOLIC PANEL - Abnormal; Notable for the following:    Glucose, Bld 110 (*)    All other components within normal limits  CBC WITH DIFFERENTIAL  TROPONIN I   Imaging Review No results found.  EKG Interpretation   None      Date: 05/05/2013  Rate: 92  Rhythm: normal sinus rhythm  QRS Axis: normal  Intervals: normal  ST/T Wave abnormalities: normal  Conduction Disutrbances:none  Narrative Interpretation:   Old EKG Reviewed: none available    MDM   1. Gastritis         Garald Balding, NP 05/05/13 0345  Garald Balding, NP 05/05/13 0345  Garald Balding, NP 05/05/13 [phone]

## 2013-05-05 NOTE — Discharge Instructions (Signed)
Gastritis, Adult Gastritis is soreness and puffiness (inflammation) of the lining of the stomach. If you do not get help, gastritis can cause bleeding and sores (ulcers) in the stomach. HOME CARE   Only take medicine as told by your doctor.  If you were given antibiotic medicines, take them as told. Finish the medicines even if you start to feel better.  Drink enough fluids to keep your pee (urine) clear or pale yellow.  Avoid foods and drinks that make your problems worse. Foods you may want to avoid include:  Caffeine or alcohol.  Chocolate.  Mint.  Garlic and onions.  Spicy foods.  Citrus fruits, including oranges, lemons, or limes.  Food containing tomatoes, including sauce, chili, salsa, and pizza.  Fried and fatty foods.  Eat small meals throughout the day instead of large meals. GET HELP RIGHT AWAY IF:   You have black or dark red poop (stools).  You throw up (vomit) blood. It may look like coffee grounds.  You cannot keep fluids down.  Your belly (abdominal) pain gets worse.  You have a fever.  You do not feel better after 1 week.  You have any other questions or concerns. MAKE SURE YOU:   Understand these instructions.  Will watch your condition.  Will get help right away if you are not doing well or get worse. Document Released: 09/05/2007 Document Revised: 06/11/2011 Document Reviewed: 05/02/2011 [facility] Patient Information 2014 [place]. Tried to eat a very bland diet for the next several days.  You have  been given 2 prescriptions one for Pepcid.  Please take this on a regular basis for the next several days.  Once in the morning once in the evening and a prescription for Phenergan, which can control any nausea that, you may develop

## 2013-05-05 NOTE — ED Notes (Signed)
Pt c/o cramping pain in chest for 3 days. Pain increases with inspiration. Pt uncooperative when asked to describe pain "It just hurts" Respirations easy non labored. Denies n/v

## 2013-05-05 NOTE — ED Provider Notes (Signed)
Medical screening examination/treatment/procedure(s) were performed by non-physician practitioner and as supervising physician I was immediately available for consultation/collaboration.  EKG Interpretation   None         Wynetta Fines, MD 05/05/13 [phone]

## 2013-07-17 ENCOUNTER — Encounter (HOSPITAL_COMMUNITY): Payer: Self-pay | Admitting: Emergency Medicine

## 2013-07-17 ENCOUNTER — Emergency Department (HOSPITAL_COMMUNITY)
Admission: EM | Admit: 2013-07-17 | Discharge: 2013-07-17 | Disposition: A | Discharge status: Home / Self Care | Payer: BC Managed Care – PPO | Attending: Emergency Medicine | Admitting: Emergency Medicine

## 2013-07-17 DIAGNOSIS — Z8679 Personal history of other diseases of the circulatory system: Secondary | ICD-10-CM | POA: Insufficient documentation

## 2013-07-17 DIAGNOSIS — F17200 Nicotine dependence, unspecified, uncomplicated: Secondary | ICD-10-CM | POA: Insufficient documentation

## 2013-07-17 DIAGNOSIS — Z8669 Personal history of other diseases of the nervous system and sense organs: Secondary | ICD-10-CM | POA: Insufficient documentation

## 2013-07-17 DIAGNOSIS — K2100 Gastro-esophageal reflux disease with esophagitis, without bleeding: Secondary | ICD-10-CM

## 2013-07-17 DIAGNOSIS — K219 Gastro-esophageal reflux disease without esophagitis: Secondary | ICD-10-CM | POA: Insufficient documentation

## 2013-07-17 MED ORDER — ESOMEPRAZOLE MAGNESIUM 40 MG PO CPDR: 40.0000 mg | DELAYED_RELEASE_CAPSULE | Freq: Every day | ORAL | Status: DC

## 2013-07-17 MED ORDER — FAMOTIDINE 20 MG PO TABS
20.0000 mg | ORAL_TABLET | Freq: Once | ORAL | Status: AC
  Administered 2013-07-17: 20 mg via ORAL
  Filled 2013-07-17: qty 1

## 2013-07-17 MED ORDER — GI COCKTAIL ~~LOC~~
30.0000 mL | Freq: Once | ORAL | Status: AC
  Administered 2013-07-17: 30 mL via ORAL
  Filled 2013-07-17: qty 30

## 2013-07-17 NOTE — ED Provider Notes (Signed)
CSN: [id number]     Arrival date & time 07/17/13  1126 History   First MD Initiated Contact with Patient 07/17/13 1232     Chief Complaint  Patient presents with  . Gastrophageal Reflux  . Dysphagia     (Consider location/radiation/quality/duration/timing/severity/associated sxs/prior Treatment) HPI Comments: 43 year old female with GERD, seizure disorder presents with burning pain with eating foods and feeling that it gets stuck just prior to entering her stomach. Patient has indigestion symptoms. Patient had similar symptoms 6 months previous improved with Prilosec. Patient does not have any history of stricture or EGD in the past. No bleeding. No fevers. Symptoms intermittent. Patient tolerating liquids fine.  Patient is a 43 y.o. female presenting with GERD. The history is provided by the patient.  Gastrophageal Reflux This is a recurrent problem. Associated symptoms include abdominal pain. Pertinent negatives include no chest pain, no headaches and no shortness of breath.    Past Medical History  Diagnosis Date  . Seizure disorder   . Pregnancy induced hypertension    Past Surgical History  Procedure Laterality Date  . Cesarean section    . Wisdom tooth extraction     Family History  Problem Relation Age of Onset  . Anesthesia problems Neg Hx   . Alcohol abuse Neg Hx   . Arthritis Neg Hx   . Asthma Neg Hx   . Birth defects Neg Hx   . Cancer Neg Hx   . COPD Neg Hx   . Depression Neg Hx   . Diabetes Neg Hx   . Drug abuse Neg Hx   . Early death Neg Hx   . Hearing loss Neg Hx   . Heart disease Neg Hx   . Hyperlipidemia Neg Hx   . Hypertension Neg Hx   . Kidney disease Neg Hx   . Learning disabilities Neg Hx   . Mental illness Neg Hx   . Mental retardation Neg Hx   . Miscarriages / Stillbirths Neg Hx   . Stroke Neg Hx   . Vision loss Neg Hx    History  Substance Use Topics  . Smoking status: Current Some Day Smoker  . Smokeless tobacco: Never Used  . Alcohol  Use: No   OB History   Grav Para Term Preterm Abortions TAB SAB Ect Mult Living   4 4 3 1      4      Review of Systems  Constitutional: Negative for fever and chills.  Respiratory: Negative for shortness of breath.   Cardiovascular: Negative for chest pain.  Gastrointestinal: Positive for nausea (epigastric) and abdominal pain. Negative for vomiting.  Genitourinary: Negative for dysuria and flank pain.  Musculoskeletal: Negative for back pain, neck pain and neck stiffness.  Skin: Negative for rash.  Neurological: Negative for light-headedness and headaches.      Allergies  Review of patient's allergies indicates no known allergies.  Home Medications   Prior to Admission medications   Medication Sig Start Date End Date Taking? Authorizing Provider  Multiple Vitamins-Minerals (MULTIVITAMIN WITH MINERALS) tablet Take 1 tablet by mouth daily.   Yes Historical Provider, MD  esomeprazole (NEXIUM) 40 MG capsule Take 1 capsule (40 mg total) by mouth daily. 07/17/13   Mariea Clonts, MD   BP 134/92  Pulse 84  Temp(Src) 98.2 F (36.8 C) (Oral)  Resp 18  SpO2 99%  LMP 04/02/2013 Physical Exam  Nursing note and vitals reviewed. Constitutional: She is oriented to person, place, and time. She appears well-developed and  well-nourished.  HENT:  Head: Normocephalic and atraumatic.  Mouth/Throat: Oropharynx is clear and moist.  Eyes: Conjunctivae are normal. Right eye exhibits no discharge. Left eye exhibits no discharge.  Neck: Neck supple. No tracheal deviation present.  Cardiovascular: Normal rate.   Pulmonary/Chest: Effort normal and breath sounds normal.  Abdominal: Soft. She exhibits no distension. There is tenderness. There is no guarding.  Musculoskeletal: She exhibits no edema.  Neurological: She is alert and oriented to person, place, and time.  Skin: Skin is warm. No rash noted.  Psychiatric: She has a normal mood and affect.    ED Course  Procedures (including  critical care time) Labs Review Labs Reviewed - No data to display  Imaging Review No results found.   EKG Interpretation None      MDM   Final diagnoses:  Reflux esophagitis   Clinically reflux esophagitis versus narrowing of the esophagus from stricture, other. Discussed PPI and close followup with primary care doctor and GI outpatient. Reasons to return given. Patient tolerates liquids. Pepcid in the ED. Results and differential diagnosis were discussed with the patient. Close follow up outpatient was discussed, patient comfortable with the plan.   Filed Vitals:   07/17/13 1142  BP: 134/92  Pulse: 84  Temp: 98.2 F (36.8 C)  TempSrc: Oral  Resp: 18  SpO2: 99%     Mariea Clonts, MD 07/17/13 1330

## 2013-07-17 NOTE — Discharge Instructions (Signed)
If you were given medicines take as directed.  If you are on coumadin or contraceptives realize their levels and effectiveness is altered by many different medicines.  If you have any reaction (rash, tongues swelling, other) to the medicines stop taking and see a physician.  If unable to tolerate food, drink boost/ smoothies until you see GI doctor.   Please follow up as directed and return to the ER or see a physician for new or worsening symptoms.  Thank you. Filed Vitals:   07/17/13 1142  BP: 134/92  Pulse: 84  Temp: 98.2 F (36.8 C)  TempSrc: Oral  Resp: 18  SpO2: 99%

## 2013-07-17 NOTE — ED Notes (Signed)
Pt states food not going down.  Same thing prior and not changing.  Pt states causing indigestion.

## 2013-07-27 ENCOUNTER — Encounter: Payer: Self-pay | Admitting: Internal Medicine

## 2013-09-15 ENCOUNTER — Encounter: Payer: Self-pay | Admitting: Internal Medicine

## 2013-09-16 ENCOUNTER — Ambulatory Visit: Payer: BC Managed Care – PPO | Admitting: Internal Medicine

## 2013-10-14 ENCOUNTER — Encounter (HOSPITAL_COMMUNITY): Payer: Self-pay | Admitting: Emergency Medicine

## 2013-10-14 ENCOUNTER — Emergency Department (INDEPENDENT_AMBULATORY_CARE_PROVIDER_SITE_OTHER)
Admission: EM | Admit: 2013-10-14 | Discharge: 2013-10-14 | Disposition: A | Discharge status: Home / Self Care | Payer: BC Managed Care – PPO | Source: Home / Self Care | Attending: Family Medicine | Admitting: Family Medicine

## 2013-10-14 ENCOUNTER — Emergency Department (INDEPENDENT_AMBULATORY_CARE_PROVIDER_SITE_OTHER): Payer: BC Managed Care – PPO

## 2013-10-14 DIAGNOSIS — W108XXA Fall (on) (from) other stairs and steps, initial encounter: Secondary | ICD-10-CM

## 2013-10-14 DIAGNOSIS — Y92009 Unspecified place in unspecified non-institutional (private) residence as the place of occurrence of the external cause: Secondary | ICD-10-CM

## 2013-10-14 DIAGNOSIS — S335XXA Sprain of ligaments of lumbar spine, initial encounter: Secondary | ICD-10-CM

## 2013-10-14 LAB — POCT URINALYSIS DIP (DEVICE)
Bilirubin Urine: NEGATIVE
Glucose, UA: NEGATIVE mg/dL
Ketones, ur: NEGATIVE mg/dL
Leukocytes, UA: NEGATIVE
Nitrite: NEGATIVE
PH: 5.5 (ref 5.0–8.0)
PROTEIN: NEGATIVE mg/dL
SPECIFIC GRAVITY, URINE: 1.02 (ref 1.005–1.030)
Urobilinogen, UA: 0.2 mg/dL (ref 0.0–1.0)

## 2013-10-14 MED ORDER — DICLOFENAC POTASSIUM 50 MG PO TABS: 50.0000 mg | ORAL_TABLET | Freq: Three times a day (TID) | ORAL | Status: DC

## 2013-10-14 MED ORDER — CYCLOBENZAPRINE HCL 5 MG PO TABS: 5.0000 mg | ORAL_TABLET | Freq: Three times a day (TID) | ORAL | Status: DC | PRN

## 2013-10-14 NOTE — ED Provider Notes (Signed)
CSN: [id number]     Arrival date & time 10/14/13  1641 History   First MD Initiated Contact with Patient 10/14/13 1734     Chief Complaint  Patient presents with  . Back Pain   (Consider location/radiation/quality/duration/timing/severity/associated sxs/prior Treatment) Patient is a 43 y.o. female presenting with back pain. The history is provided by the patient.  Back Pain Location:  Lumbar spine Quality:  Shooting and stiffness Radiates to:  Does not radiate Pain severity:  Moderate Onset quality:  Gradual Duration:  2 days Progression:  Worsening Chronicity:  New Context: falling   Context comment:  Fell on steps at home on mon then worse yest after work with lifting boxes., no gi or gu sx, no radiation. Relieved by:  None tried Worsened by:  Nothing tried Ineffective treatments:  None tried Associated symptoms: no abdominal pain, no chest pain, no dysuria, no fever, no leg pain, no numbness, no tingling and no weakness     Past Medical History  Diagnosis Date  . Seizure disorder   . Pregnancy induced hypertension    Past Surgical History  Procedure Laterality Date  . Cesarean section    . Wisdom tooth extraction     Family History  Problem Relation Age of Onset  . Anesthesia problems Neg Hx   . Alcohol abuse Neg Hx   . Arthritis Neg Hx   . Asthma Neg Hx   . Birth defects Neg Hx   . Cancer Neg Hx   . COPD Neg Hx   . Depression Neg Hx   . Diabetes Neg Hx   . Drug abuse Neg Hx   . Early death Neg Hx   . Hearing loss Neg Hx   . Heart disease Neg Hx   . Hyperlipidemia Neg Hx   . Hypertension Neg Hx   . Kidney disease Neg Hx   . Learning disabilities Neg Hx   . Mental illness Neg Hx   . Mental retardation Neg Hx   . Miscarriages / Stillbirths Neg Hx   . Stroke Neg Hx   . Vision loss Neg Hx    History  Substance Use Topics  . Smoking status: Current Some Day Smoker  . Smokeless tobacco: Never Used  . Alcohol Use: No   OB History   Grav Para Term  Preterm Abortions TAB SAB Ect Mult Living   4 4 3 1      4      Review of Systems  Constitutional: Negative.  Negative for fever.  Cardiovascular: Negative for chest pain.  Gastrointestinal: Negative.  Negative for abdominal pain.  Genitourinary: Negative.  Negative for dysuria.  Musculoskeletal: Positive for back pain.  Neurological: Negative for tingling, weakness and numbness.    Allergies  Review of patient's allergies indicates no known allergies.  Home Medications   Prior to Admission medications   Medication Sig Start Date End Date Taking? Authorizing Provider  cyclobenzaprine (FLEXERIL) 5 MG tablet Take 1 tablet (5 mg total) by mouth 3 (three) times daily as needed for muscle spasms. 10/14/13   Billy Fischer, MD  diclofenac (CATAFLAM) 50 MG tablet Take 1 tablet (50 mg total) by mouth 3 (three) times daily. For back pain 10/14/13   Billy Fischer, MD  esomeprazole (NEXIUM) 40 MG capsule Take 1 capsule (40 mg total) by mouth daily. 07/17/13   Mariea Clonts, MD  Multiple Vitamins-Minerals (MULTIVITAMIN WITH MINERALS) tablet Take 1 tablet by mouth daily.    Historical Provider, MD  BP 140/99  Pulse 72  Temp(Src) 98.5 F (36.9 C) (Oral)  Resp 16  SpO2 100%  LMP 09/14/2013 Physical Exam  Nursing note and vitals reviewed. Constitutional: She is oriented to person, place, and time. She appears well-developed and well-nourished. She appears distressed.  Abdominal: Soft. Bowel sounds are normal. She exhibits no distension and no mass. There is no tenderness. There is no rebound and no guarding.  Musculoskeletal: She exhibits tenderness.       Lumbar back: She exhibits decreased range of motion, tenderness, pain and spasm. She exhibits no deformity and normal pulse.       Back:  Neurological: She is alert and oriented to person, place, and time.  Skin: Skin is warm and dry.    ED Course  Procedures (including critical care time) Labs Review Labs Reviewed  POCT URINALYSIS  DIP (DEVICE) - Abnormal; Notable for the following:    Hgb urine dipstick SMALL (*)    All other components within normal limits    Imaging Review Dg Lumbar Spine Complete  10/14/2013   CLINICAL DATA:  Fall.  EXAM: LUMBAR SPINE - COMPLETE 4+ VIEW  COMPARISON:  None.  FINDINGS: Calcifications within the pelvis consistent with phleboliths. Paraspinal soft tissues are unremarkable. No acute bony abnormality identified. Diffuse degenerative change. Normal bony alignment and mineralization.  IMPRESSION: Diffuse degenerative change.  No acute abnormality.   Electronically Signed   By: Marcello Moores  Register   On: 10/14/2013 18:28    X-rays reviewed and report per radiologist.   MDM   1. Lumbar back sprain, initial encounter        Billy Fischer, MD 10/14/13 [phone]

## 2013-10-14 NOTE — ED Notes (Signed)
C/o of right sided back pain.  Pt reports almost falling down steps on 7/13.  States "caught self from falling.  Heavy lifting at work"  No relief with otc pain meds.

## 2013-11-02 ENCOUNTER — Emergency Department (HOSPITAL_COMMUNITY)
Admission: EM | Admit: 2013-11-02 | Discharge: 2013-11-02 | Disposition: A | Discharge status: Home / Self Care | Payer: BC Managed Care – PPO | Source: Home / Self Care | Attending: Family Medicine | Admitting: Family Medicine

## 2013-11-02 ENCOUNTER — Encounter (HOSPITAL_COMMUNITY): Payer: Self-pay | Admitting: Emergency Medicine

## 2013-11-02 ENCOUNTER — Emergency Department (HOSPITAL_COMMUNITY)
Admission: EM | Admit: 2013-11-02 | Discharge: 2013-11-02 | Disposition: A | Discharge status: Home / Self Care | Payer: BC Managed Care – PPO

## 2013-11-02 DIAGNOSIS — K029 Dental caries, unspecified: Secondary | ICD-10-CM

## 2013-11-02 MED ORDER — HYDROCODONE-ACETAMINOPHEN 5-325 MG PO TABS: 1.0000 | ORAL_TABLET | Freq: Four times a day (QID) | ORAL | Status: DC | PRN

## 2013-11-02 MED ORDER — PENICILLIN V POTASSIUM 500 MG PO TABS: 500.0000 mg | ORAL_TABLET | Freq: Three times a day (TID) | ORAL | Status: DC

## 2013-11-02 MED ORDER — KETOROLAC TROMETHAMINE 60 MG/2ML IM SOLN
60.0000 mg | Freq: Once | INTRAMUSCULAR | Status: AC
  Administered 2013-11-02: 60 mg via INTRAMUSCULAR

## 2013-11-02 MED ORDER — LIDOCAINE VISCOUS 2 % MT SOLN: OROMUCOSAL | Status: DC

## 2013-11-02 MED ORDER — KETOROLAC TROMETHAMINE 60 MG/2ML IM SOLN
INTRAMUSCULAR | Status: AC
  Filled 2013-11-02: qty 2

## 2013-11-02 NOTE — ED Notes (Signed)
C/o mouth pain since yesterday; tearful

## 2013-11-02 NOTE — ED Provider Notes (Signed)
Medical screening examination/treatment/procedure(s) were performed by resident physician or non-physician practitioner and as supervising physician I was immediately available for consultation/collaboration.   Pauline Good MD.   Billy Fischer, MD 11/02/13 2035

## 2013-11-02 NOTE — Discharge Instructions (Signed)

## 2013-11-02 NOTE — ED Notes (Signed)
Pt decided that she not longer wants to be seen and is going to the Urgent Care.

## 2013-11-02 NOTE — ED Provider Notes (Signed)
CSN: [id number]     Arrival date & time 11/02/13  1916 History   First MD Initiated Contact with Patient 11/02/13 1944     Chief Complaint  Patient presents with  . Dental Pain   (Consider location/radiation/quality/duration/timing/severity/associated sxs/prior Treatment) HPI Comments: Has had a long standing fracture of involved tooth. Tooth and right facial area have become painful over last 24 hours. Pain radiates to right ear.   Patient is a 43 y.o. female presenting with tooth pain. The history is provided by the patient.  Dental Pain Location:  Lower and upper Upper teeth location:  1/RU 3rd molar Quality:  Throbbing Severity:  Moderate Onset quality:  Gradual Duration:  24 hours Timing:  Constant Progression:  Worsening Chronicity:  New   Past Medical History  Diagnosis Date  . Seizure disorder   . Pregnancy induced hypertension    Past Surgical History  Procedure Laterality Date  . Cesarean section    . Wisdom tooth extraction     Family History  Problem Relation Age of Onset  . Anesthesia problems Neg Hx   . Alcohol abuse Neg Hx   . Arthritis Neg Hx   . Asthma Neg Hx   . Birth defects Neg Hx   . Cancer Neg Hx   . COPD Neg Hx   . Depression Neg Hx   . Diabetes Neg Hx   . Drug abuse Neg Hx   . Early death Neg Hx   . Hearing loss Neg Hx   . Heart disease Neg Hx   . Hyperlipidemia Neg Hx   . Hypertension Neg Hx   . Kidney disease Neg Hx   . Learning disabilities Neg Hx   . Mental illness Neg Hx   . Mental retardation Neg Hx   . Miscarriages / Stillbirths Neg Hx   . Stroke Neg Hx   . Vision loss Neg Hx    History  Substance Use Topics  . Smoking status: Current Some Day Smoker  . Smokeless tobacco: Never Used  . Alcohol Use: No   OB History   Grav Para Term Preterm Abortions TAB SAB Ect Mult Living   4 4 3 1      4      Review of Systems  All other systems reviewed and are negative.   Allergies  Review of patient's allergies indicates no  known allergies.  Home Medications   Prior to Admission medications   Medication Sig Start Date End Date Taking? Authorizing Provider  cyclobenzaprine (FLEXERIL) 5 MG tablet Take 1 tablet (5 mg total) by mouth 3 (three) times daily as needed for muscle spasms. 10/14/13   Billy Fischer, MD  diclofenac (CATAFLAM) 50 MG tablet Take 1 tablet (50 mg total) by mouth 3 (three) times daily. For back pain 10/14/13   Billy Fischer, MD  esomeprazole (NEXIUM) 40 MG capsule Take 1 capsule (40 mg total) by mouth daily. 07/17/13   Mariea Clonts, MD  HYDROcodone-acetaminophen (NORCO/VICODIN) 5-325 MG per tablet Take 1 tablet by mouth every 6 (six) hours as needed for moderate pain or severe pain. 11/02/13   Lahoma Rocker, PA  lidocaine (XYLOCAINE) 2 % solution Apply to affected area using cotton swab every three hours as needed for pain 11/02/13   Lahoma Rocker, PA  Multiple Vitamins-Minerals (MULTIVITAMIN WITH MINERALS) tablet Take 1 tablet by mouth daily.    Historical Provider, MD  penicillin v potassium (VEETID) 500 MG tablet Take 1 tablet (500 mg total) by  mouth 3 (three) times daily. X 7 days 11/02/13   Annett Gula Kalee Broxton, PA   BP 139/86  Pulse 82  Temp(Src) 98.3 F (36.8 C) (Oral)  Resp 16  SpO2 100%  LMP 09/14/2013 Physical Exam  Nursing note and vitals reviewed. Constitutional: She is oriented to person, place, and time. She appears well-developed and well-nourished. No distress.  HENT:  Head: Normocephalic and atraumatic.  Right Ear: Hearing, tympanic membrane, external ear and ear canal normal.  Mouth/Throat: Uvula is midline, oropharynx is clear and moist and mucous membranes are normal. No oral lesions. No trismus in the jaw. No uvula swelling.    Eyes: Conjunctivae are normal. No scleral icterus.  Cardiovascular: Normal rate.   Pulmonary/Chest: Effort normal.  Musculoskeletal: Normal range of motion.  Lymphadenopathy:    She has no cervical adenopathy.  Neurological: She is  alert and oriented to person, place, and time.  Skin: Skin is warm and dry. No rash noted. No erythema.  Psychiatric: She has a normal mood and affect. Her behavior is normal.    ED Course  Procedures (including critical care time) Labs Review Labs Reviewed - No data to display  Imaging Review No results found.   MDM   1. Dental cavity   PCN-VK, xylocaine and Vicodin as prescribed. Patient given resources for dental options in the community along with information about upcoming free dental clinic 8-28 & 11-28-2013.    [place], [place] 11/02/13 2007

## 2014-02-01 ENCOUNTER — Encounter (HOSPITAL_COMMUNITY): Payer: Self-pay | Admitting: Emergency Medicine

## 2014-03-05 ENCOUNTER — Ambulatory Visit: Payer: Self-pay

## 2014-03-05 ENCOUNTER — Other Ambulatory Visit: Payer: Self-pay | Admitting: Occupational Medicine

## 2014-03-05 DIAGNOSIS — R52 Pain, unspecified: Secondary | ICD-10-CM

## 2014-04-04 ENCOUNTER — Emergency Department (HOSPITAL_COMMUNITY)
Admission: EM | Admit: 2014-04-04 | Discharge: 2014-04-04 | Disposition: A | Discharge status: Home / Self Care | Payer: BC Managed Care – PPO | Attending: Emergency Medicine | Admitting: Emergency Medicine

## 2014-04-04 ENCOUNTER — Encounter (HOSPITAL_COMMUNITY): Payer: Self-pay | Admitting: Emergency Medicine

## 2014-04-04 DIAGNOSIS — G8911 Acute pain due to trauma: Secondary | ICD-10-CM | POA: Diagnosis not present

## 2014-04-04 DIAGNOSIS — R079 Chest pain, unspecified: Secondary | ICD-10-CM | POA: Diagnosis not present

## 2014-04-04 DIAGNOSIS — M25512 Pain in left shoulder: Secondary | ICD-10-CM | POA: Diagnosis present

## 2014-04-04 DIAGNOSIS — M542 Cervicalgia: Secondary | ICD-10-CM | POA: Diagnosis not present

## 2014-04-04 DIAGNOSIS — Z79899 Other long term (current) drug therapy: Secondary | ICD-10-CM | POA: Diagnosis not present

## 2014-04-04 DIAGNOSIS — Z720 Tobacco use: Secondary | ICD-10-CM | POA: Diagnosis not present

## 2014-04-04 MED ORDER — OXYCODONE-ACETAMINOPHEN 5-325 MG PO TABS
1.0000 | ORAL_TABLET | ORAL | Status: DC | PRN
Start: 2014-04-04 — End: 2016-04-24

## 2014-04-04 MED ORDER — LORAZEPAM 1 MG PO TABS
1.0000 mg | ORAL_TABLET | Freq: Once | ORAL | Status: AC
  Administered 2014-04-04: 1 mg via ORAL
  Filled 2014-04-04: qty 1

## 2014-04-04 MED ORDER — IBUPROFEN 200 MG PO TABS
600.0000 mg | ORAL_TABLET | Freq: Once | ORAL | Status: AC
  Administered 2014-04-04: 600 mg via ORAL
  Filled 2014-04-04: qty 3

## 2014-04-04 MED ORDER — CYCLOBENZAPRINE HCL 10 MG PO TABS: 10.0000 mg | ORAL_TABLET | Freq: Three times a day (TID) | ORAL | Status: DC | PRN

## 2014-04-04 MED ORDER — HYDROMORPHONE HCL 1 MG/ML IJ SOLN
1.0000 mg | Freq: Once | INTRAMUSCULAR | Status: AC
  Administered 2014-04-04: 1 mg via INTRAMUSCULAR
  Filled 2014-04-04: qty 1

## 2014-04-04 NOTE — ED Provider Notes (Signed)
CSN: [id number]     Arrival date & time 04/04/14  0947 History   First MD Initiated Contact with Patient 04/04/14 [phone]     Chief Complaint  Patient presents with  . Arm Pain  . Chest Pain     (Consider location/radiation/quality/duration/timing/severity/associated sxs/prior Treatment) HPI   43yF with L shoulder pain. Onset 12/4 after injury it at work. Persistent since, but worse in past 2-3 days and feels like it's "locking up." Pain in L upper arm, shoulder, L lateral neck and L upper chest. Pain at rest and worse with movement. Constant. Deep, burning pain. No numbness or tingling. Reports previously seen shortly after injuring it and referred to orthopedics. "They told me there was something wrong with my rotator cuff." Reports had xrays but not sure of specifics results. Has yet to follow-up. Prescribed vicodin and currently out.   Past Medical History  Diagnosis Date  . Seizure disorder   . Pregnancy induced hypertension    Past Surgical History  Procedure Laterality Date  . Cesarean section    . Wisdom tooth extraction     Family History  Problem Relation Age of Onset  . Anesthesia problems Neg Hx   . Alcohol abuse Neg Hx   . Arthritis Neg Hx   . Asthma Neg Hx   . Birth defects Neg Hx   . Cancer Neg Hx   . COPD Neg Hx   . Depression Neg Hx   . Diabetes Neg Hx   . Drug abuse Neg Hx   . Early death Neg Hx   . Hearing loss Neg Hx   . Heart disease Neg Hx   . Hyperlipidemia Neg Hx   . Hypertension Neg Hx   . Kidney disease Neg Hx   . Learning disabilities Neg Hx   . Mental illness Neg Hx   . Mental retardation Neg Hx   . Miscarriages / Stillbirths Neg Hx   . Stroke Neg Hx   . Vision loss Neg Hx    History  Substance Use Topics  . Smoking status: Current Some Day Smoker  . Smokeless tobacco: Never Used  . Alcohol Use: No   OB History    Gravida Para Term Preterm AB TAB SAB Ectopic Multiple Living   4 4 3 1      4      Review of Systems  All systems  reviewed and negative, other than as noted in HPI.   Allergies  Review of patient's allergies indicates no known allergies.  Home Medications   Prior to Admission medications   Medication Sig Start Date End Date Taking? Authorizing Provider  esomeprazole (NEXIUM) 40 MG capsule Take 1 capsule (40 mg total) by mouth daily. 07/17/13  Yes Mariea Clonts, MD  Multiple Vitamins-Minerals (MULTIVITAMIN WITH MINERALS) tablet Take 1 tablet by mouth daily.   Yes Historical Provider, MD  cyclobenzaprine (FLEXERIL) 10 MG tablet Take 1 tablet (10 mg total) by mouth 3 (three) times daily as needed for muscle spasms. 04/04/14   Virgel Manifold, MD  cyclobenzaprine (FLEXERIL) 5 MG tablet Take 1 tablet (5 mg total) by mouth 3 (three) times daily as needed for muscle spasms. Patient not taking: Reported on 04/04/2014 10/14/13   Billy Fischer, MD  diclofenac (CATAFLAM) 50 MG tablet Take 1 tablet (50 mg total) by mouth 3 (three) times daily. For back pain Patient not taking: Reported on 04/04/2014 10/14/13   Billy Fischer, MD  HYDROcodone-acetaminophen (NORCO/VICODIN) 5-325 MG per tablet Take 1  tablet by mouth every 6 (six) hours as needed for moderate pain or severe pain. Patient not taking: Reported on 04/04/2014 11/02/13   Lutricia Feil, PA  lidocaine (XYLOCAINE) 2 % solution Apply to affected area using cotton swab every three hours as needed for pain Patient not taking: Reported on 04/04/2014 11/02/13   Lutricia Feil, PA  oxyCODONE-acetaminophen (PERCOCET/ROXICET) 5-325 MG per tablet Take 1-2 tablets by mouth every 4 (four) hours as needed for severe pain. 04/04/14   Virgel Manifold, MD  penicillin v potassium (VEETID) 500 MG tablet Take 1 tablet (500 mg total) by mouth 3 (three) times daily. X 7 days Patient not taking: Reported on 04/04/2014 11/02/13   Audelia Hives Presson, PA   BP 127/86 mmHg  Pulse 88  Temp(Src) 97.8 F (36.6 C) (Oral)  Resp 19  SpO2 100%  LMP 02/19/2014 Physical Exam  Constitutional:  She appears well-developed and well-nourished. No distress.  HENT:  Head: Normocephalic and atraumatic.  Eyes: Conjunctivae are normal. Right eye exhibits no discharge. Left eye exhibits no discharge.  Neck: Neck supple.  Cardiovascular: Normal rate, regular rhythm and normal heart sounds.  Exam reveals no gallop and no friction rub.   No murmur heard. Pulmonary/Chest: Effort normal and breath sounds normal. No respiratory distress.  Abdominal: Soft. She exhibits no distension. There is no tenderness.  Musculoskeletal: She exhibits no edema or tenderness.  L shoulder normal in appearance. Symmetric as compared to R. Mild TTP over L anterior shoulder. Able to passively internally and externally rotate without apparent pain. She resists attempt to abduct and flex. NVI distally.   Neurological: She is alert.  Skin: Skin is warm and dry.  Psychiatric: She has a normal mood and affect. Her behavior is normal. Thought content normal.  Nursing note and vitals reviewed.   ED Course  Procedures (including critical care time) Labs Review Labs Reviewed - No data to display  Imaging Review No results found.   EKG Interpretation   Date/Time:  Sunday April 04 2014 07:22:33 EST Ventricular Rate:  88 PR Interval:  144 QRS Duration: 91 QT Interval:  391 QTC Calculation: 473 R Axis:   78 Text Interpretation:  Sinus rhythm Non-specific ST-t changes since last  tracing no significant change Confirmed by Osborn Pullin  MD, Tomi Grandpre ([id number]) on  04/04/2014 7:34:40 AM      MDM   Final diagnoses:  Left shoulder pain    43yF with l shoulder pain. Ongoing for about a month but increasing over last few days. Denies new trauma. Likely internal derangement of shoulder. NVI. No signs of infection. Additional plain films likely of little utility. Will tx symptomatically. Ortho FU.     Virgel Manifold, MD 04/04/14 [phone]

## 2014-04-04 NOTE — ED Notes (Signed)
Pt c/o left arm pain that radiates across her left side of her chest that started two days ago. Pt describes it as burning pain.  Pt states that she hurt her left arm at work on Dec 4 causing damage to her rotator cuff.

## 2014-04-04 NOTE — Discharge Instructions (Signed)

## 2015-04-30 ENCOUNTER — Encounter (HOSPITAL_COMMUNITY): Payer: Self-pay | Admitting: Emergency Medicine

## 2015-04-30 ENCOUNTER — Emergency Department (INDEPENDENT_AMBULATORY_CARE_PROVIDER_SITE_OTHER)
Admission: EM | Admit: 2015-04-30 | Discharge: 2015-04-30 | Disposition: A | Discharge status: Home / Self Care | Payer: Self-pay | Source: Home / Self Care | Attending: Family Medicine | Admitting: Family Medicine

## 2015-04-30 DIAGNOSIS — B028 Zoster with other complications: Secondary | ICD-10-CM

## 2015-04-30 DIAGNOSIS — L308 Other specified dermatitis: Principal | ICD-10-CM

## 2015-04-30 MED ORDER — VALACYCLOVIR HCL 1 G PO TABS: 1000.0000 mg | ORAL_TABLET | Freq: Three times a day (TID) | ORAL | Status: DC

## 2015-04-30 NOTE — ED Provider Notes (Signed)
CSN: [id number]     Arrival date & time 04/30/15  1456 History   First MD Initiated Contact with Patient 04/30/15 1617     Chief Complaint  Patient presents with  . Rash   (Consider location/radiation/quality/duration/timing/severity/associated sxs/prior Treatment) Patient is a 45 y.o. female presenting with rash. The history is provided by the patient.  Rash Location:  Torso Torso rash location:  Upper back Quality: blistering, painful and redness   Pain details:    Severity:  Moderate   Onset quality:  Sudden   Duration:  2 days Severity:  Moderate Onset quality:  Gradual Chronicity:  New Relieved by:  None tried Worsened by:  Nothing tried Ineffective treatments:  None tried Associated symptoms: no fever     Past Medical History  Diagnosis Date  . Seizure disorder ([place])   . Pregnancy induced hypertension    Past Surgical History  Procedure Laterality Date  . Cesarean section    . Wisdom tooth extraction     Family History  Problem Relation Age of Onset  . Anesthesia problems Neg Hx   . Alcohol abuse Neg Hx   . Arthritis Neg Hx   . Asthma Neg Hx   . Birth defects Neg Hx   . Cancer Neg Hx   . COPD Neg Hx   . Depression Neg Hx   . Diabetes Neg Hx   . Drug abuse Neg Hx   . Early death Neg Hx   . Hearing loss Neg Hx   . Heart disease Neg Hx   . Hyperlipidemia Neg Hx   . Hypertension Neg Hx   . Kidney disease Neg Hx   . Learning disabilities Neg Hx   . Mental illness Neg Hx   . Mental retardation Neg Hx   . Miscarriages / Stillbirths Neg Hx   . Stroke Neg Hx   . Vision loss Neg Hx    Social History  Substance Use Topics  . Smoking status: Current Some Day Smoker  . Smokeless tobacco: Never Used  . Alcohol Use: No   OB History    Gravida Para Term Preterm AB TAB SAB Ectopic Multiple Living   4 4 3 1      4      Review of Systems  Constitutional: Negative.  Negative for fever.  HENT: Negative.   Respiratory: Negative.   Cardiovascular: Negative.    Skin: Positive for rash.  All other systems reviewed and are negative.   Allergies  Review of patient's allergies indicates no known allergies.  Home Medications   Prior to Admission medications   Medication Sig Start Date End Date Taking? Authorizing Provider  cyclobenzaprine (FLEXERIL) 10 MG tablet Take 1 tablet (10 mg total) by mouth 3 (three) times daily as needed for muscle spasms. 04/04/14   Virgel Manifold, MD  cyclobenzaprine (FLEXERIL) 5 MG tablet Take 1 tablet (5 mg total) by mouth 3 (three) times daily as needed for muscle spasms. Patient not taking: Reported on 04/04/2014 10/14/13   Billy Fischer, MD  diclofenac (CATAFLAM) 50 MG tablet Take 1 tablet (50 mg total) by mouth 3 (three) times daily. For back pain Patient not taking: Reported on 04/04/2014 10/14/13   Billy Fischer, MD  esomeprazole (NEXIUM) 40 MG capsule Take 1 capsule (40 mg total) by mouth daily. 07/17/13   Elnora Morrison, MD  HYDROcodone-acetaminophen (NORCO/VICODIN) 5-325 MG per tablet Take 1 tablet by mouth every 6 (six) hours as needed for moderate pain or severe pain. Patient not  taking: Reported on 04/04/2014 11/02/13   Lutricia Feil, PA  lidocaine (XYLOCAINE) 2 % solution Apply to affected area using cotton swab every three hours as needed for pain Patient not taking: Reported on 04/04/2014 11/02/13   Lutricia Feil, PA  Multiple Vitamins-Minerals (MULTIVITAMIN WITH MINERALS) tablet Take 1 tablet by mouth daily.    Historical Provider, MD  oxyCODONE-acetaminophen (PERCOCET/ROXICET) 5-325 MG per tablet Take 1-2 tablets by mouth every 4 (four) hours as needed for severe pain. 04/04/14   Virgel Manifold, MD  penicillin v potassium (VEETID) 500 MG tablet Take 1 tablet (500 mg total) by mouth 3 (three) times daily. X 7 days Patient not taking: Reported on 04/04/2014 11/02/13   Audelia Hives Presson, PA  valACYclovir (VALTREX) 1000 MG tablet Take 1 tablet (1,000 mg total) by mouth 3 (three) times daily. 04/30/15   Billy Fischer, MD   Meds Ordered and Administered this Visit  Medications - No data to display  BP 125/82 mmHg  Pulse 86  Temp(Src) 97.7 F (36.5 C) (Oral)  SpO2 97% No data found.   Physical Exam  Constitutional: She is oriented to person, place, and time. She appears well-developed and well-nourished. No distress.  Neck: Normal range of motion. Neck supple.  Neurological: She is alert and oriented to person, place, and time.  Skin: Skin is warm and dry. Rash noted.  Erythematous based vesicular patch to left scapular back,  Nursing note and vitals reviewed.   ED Course  Procedures (including critical care time)  Labs Review Labs Reviewed - No data to display  Imaging Review No results found.   Visual Acuity Review  Right Eye Distance:   Left Eye Distance:   Bilateral Distance:    Right Eye Near:   Left Eye Near:    Bilateral Near:         MDM   1. Herpes zoster dermatitis        Billy Fischer, MD 04/30/15 [phone]

## 2015-04-30 NOTE — ED Notes (Signed)
Pt here with possible shingles to left upper back area that appeared Thursday C/o radiating pain going down back Denies itching, burning  Tried Hydrocortisone cream

## 2016-04-08 ENCOUNTER — Emergency Department (HOSPITAL_COMMUNITY): Payer: BLUE CROSS/BLUE SHIELD

## 2016-04-08 ENCOUNTER — Encounter (HOSPITAL_COMMUNITY): Payer: Self-pay | Admitting: Emergency Medicine

## 2016-04-08 ENCOUNTER — Emergency Department (HOSPITAL_COMMUNITY)
Admission: EM | Admit: 2016-04-08 | Discharge: 2016-04-08 | Disposition: A | Discharge status: Home / Self Care | Payer: BLUE CROSS/BLUE SHIELD | Attending: Emergency Medicine | Admitting: Emergency Medicine

## 2016-04-08 DIAGNOSIS — S12300A Unspecified displaced fracture of fourth cervical vertebra, initial encounter for closed fracture: Secondary | ICD-10-CM | POA: Insufficient documentation

## 2016-04-08 DIAGNOSIS — Y939 Activity, unspecified: Secondary | ICD-10-CM | POA: Diagnosis not present

## 2016-04-08 DIAGNOSIS — Z79899 Other long term (current) drug therapy: Secondary | ICD-10-CM | POA: Insufficient documentation

## 2016-04-08 DIAGNOSIS — Z7982 Long term (current) use of aspirin: Secondary | ICD-10-CM | POA: Insufficient documentation

## 2016-04-08 DIAGNOSIS — S199XXA Unspecified injury of neck, initial encounter: Secondary | ICD-10-CM | POA: Diagnosis present

## 2016-04-08 DIAGNOSIS — Y92410 Unspecified street and highway as the place of occurrence of the external cause: Secondary | ICD-10-CM | POA: Insufficient documentation

## 2016-04-08 DIAGNOSIS — Y999 Unspecified external cause status: Secondary | ICD-10-CM | POA: Diagnosis not present

## 2016-04-08 DIAGNOSIS — S12390A Other displaced fracture of fourth cervical vertebra, initial encounter for closed fracture: Secondary | ICD-10-CM

## 2016-04-08 DIAGNOSIS — Z87891 Personal history of nicotine dependence: Secondary | ICD-10-CM | POA: Insufficient documentation

## 2016-04-08 DIAGNOSIS — S15109A Unspecified injury of unspecified vertebral artery, initial encounter: Secondary | ICD-10-CM

## 2016-04-08 DIAGNOSIS — S159XXA Injury of unspecified blood vessel at neck level, initial encounter: Secondary | ICD-10-CM | POA: Insufficient documentation

## 2016-04-08 LAB — I-STAT CHEM 8, ED
BUN: 6 mg/dL (ref 6–20)
CALCIUM ION: 1.24 mmol/L (ref 1.15–1.40)
Chloride: 104 mmol/L (ref 101–111)
Creatinine, Ser: 0.7 mg/dL (ref 0.44–1.00)
GLUCOSE: 111 mg/dL — AB (ref 65–99)
HCT: 39 % (ref 36.0–46.0)
Hemoglobin: 13.3 g/dL (ref 12.0–15.0)
Potassium: 3.8 mmol/L (ref 3.5–5.1)
SODIUM: 141 mmol/L (ref 135–145)
TCO2: 27 mmol/L (ref 0–100)

## 2016-04-08 LAB — CBC
HCT: 37.8 % (ref 36.0–46.0)
Hemoglobin: 12.8 g/dL (ref 12.0–15.0)
MCH: 28.7 pg (ref 26.0–34.0)
MCHC: 33.9 g/dL (ref 30.0–36.0)
MCV: 84.8 fL (ref 78.0–100.0)
Platelets: 304 10*3/uL (ref 150–400)
RBC: 4.46 MIL/uL (ref 3.87–5.11)
RDW: 12.9 % (ref 11.5–15.5)
WBC: 4.7 10*3/uL (ref 4.0–10.5)

## 2016-04-08 MED ORDER — HYDROCODONE-ACETAMINOPHEN 5-325 MG PO TABS
1.0000 | ORAL_TABLET | Freq: Four times a day (QID) | ORAL | 0 refills | Status: DC | PRN
Start: 2016-04-08 — End: 2016-04-24

## 2016-04-08 MED ORDER — SODIUM CHLORIDE 0.9 % IV SOLN
INTRAVENOUS | Status: DC
  Administered 2016-04-08: 20:00:00 via INTRAVENOUS

## 2016-04-08 MED ORDER — HYDROMORPHONE HCL 2 MG/ML IJ SOLN
1.0000 mg | Freq: Once | INTRAMUSCULAR | Status: AC
  Administered 2016-04-08: 1 mg via INTRAVENOUS
  Filled 2016-04-08: qty 1

## 2016-04-08 MED ORDER — IOPAMIDOL (ISOVUE-370) INJECTION 76%
INTRAVENOUS | Status: AC
  Administered 2016-04-08: 50 mL
  Filled 2016-04-08: qty 50

## 2016-04-08 NOTE — ED Notes (Signed)
Patient transported to CT 
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
## 2016-04-08 NOTE — Discharge Instructions (Signed)
Follow up with the trauma surgeons at [facility] as planned, continue your current medications
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
## 2016-04-08 NOTE — ED Notes (Signed)
Pt medicated per MD order. CT notified pt ready
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
## 2016-04-08 NOTE — ED Notes (Signed)
Pt and family verbalized understanding of d/c instructions. Pt taken to car by this RN. Pt also educated on stool softener usage while using narcotic pain medication.

## 2016-04-24 ENCOUNTER — Encounter: Payer: Self-pay | Admitting: Family Medicine

## 2016-04-24 ENCOUNTER — Ambulatory Visit (INDEPENDENT_AMBULATORY_CARE_PROVIDER_SITE_OTHER): Payer: BLUE CROSS/BLUE SHIELD | Admitting: Family Medicine

## 2016-04-24 VITALS — BP 128/80 | HR 72 | Resp 12 | Ht 65.0 in | Wt 161.1 lb

## 2016-04-24 DIAGNOSIS — S12391D Other nondisplaced fracture of fourth cervical vertebra, subsequent encounter for fracture with routine healing: Secondary | ICD-10-CM | POA: Diagnosis not present

## 2016-04-24 DIAGNOSIS — M25512 Pain in left shoulder: Secondary | ICD-10-CM | POA: Diagnosis not present

## 2016-04-24 MED ORDER — DICLOFENAC SODIUM ER 100 MG PO TB24
100.0000 mg | ORAL_TABLET | Freq: Every day | ORAL | 0 refills | Status: DC
Start: 2016-04-24 — End: 2016-08-06

## 2016-04-24 MED ORDER — HYDROCODONE-ACETAMINOPHEN 5-325 MG PO TABS: 1.0000 | ORAL_TABLET | Freq: Three times a day (TID) | ORAL | 0 refills | Status: DC | PRN

## 2016-04-24 MED ORDER — CYCLOBENZAPRINE HCL 10 MG PO TABS: 10.0000 mg | ORAL_TABLET | Freq: Three times a day (TID) | ORAL | 2 refills | Status: DC | PRN
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
## 2016-04-24 NOTE — Patient Instructions (Signed)
A few things to remember from today's visit:   Other closed nondisplaced fracture of fourth cervical vertebra with routine healing, subsequent encounter - Plan: Diclofenac Sodium CR 100 MG 24 hr tablet, cyclobenzaprine (FLEXERIL) 10 MG tablet, HYDROcodone-acetaminophen (NORCO/VICODIN) 5-325 MG tablet  Pain in joint of left shoulder - Plan: Diclofenac Sodium CR 100 MG 24 hr tablet  The plan is to continue pain medication, Hydrocodone , for 2 weeks. Takes Diclofenac with food. Flexeril up to 3 times per day.  Cautions with falls.    Please be sure medication list is accurate. If a new problem present, please set up appointment sooner than planned today.

## 2016-04-24 NOTE — Progress Notes (Signed)
Pre visit review using our clinic review tool, if applicable. No additional management support is needed unless otherwise documented below in the visit note. 

## 2016-05-08 ENCOUNTER — Encounter: Payer: Self-pay | Admitting: Family Medicine

## 2016-05-08 ENCOUNTER — Ambulatory Visit (INDEPENDENT_AMBULATORY_CARE_PROVIDER_SITE_OTHER)
Admission: RE | Admit: 2016-05-08 | Discharge: 2016-05-08 | Disposition: A | Discharge status: Home / Self Care | Payer: BLUE CROSS/BLUE SHIELD | Source: Ambulatory Visit | Attending: Family Medicine | Admitting: Family Medicine

## 2016-05-08 ENCOUNTER — Ambulatory Visit (INDEPENDENT_AMBULATORY_CARE_PROVIDER_SITE_OTHER): Payer: BLUE CROSS/BLUE SHIELD | Admitting: Family Medicine

## 2016-05-08 VITALS — BP 128/78 | HR 81 | Resp 12 | Ht 65.0 in | Wt 161.2 lb

## 2016-05-08 DIAGNOSIS — S12391D Other nondisplaced fracture of fourth cervical vertebra, subsequent encounter for fracture with routine healing: Secondary | ICD-10-CM | POA: Diagnosis not present

## 2016-05-08 DIAGNOSIS — R0789 Other chest pain: Secondary | ICD-10-CM

## 2016-05-08 DIAGNOSIS — M25512 Pain in left shoulder: Secondary | ICD-10-CM

## 2016-05-08 DIAGNOSIS — M549 Dorsalgia, unspecified: Secondary | ICD-10-CM

## 2016-05-08 DIAGNOSIS — R209 Unspecified disturbances of skin sensation: Secondary | ICD-10-CM

## 2016-05-08 MED ORDER — HYDROCODONE-ACETAMINOPHEN 5-325 MG PO TABS: 1.0000 | ORAL_TABLET | Freq: Two times a day (BID) | ORAL | 0 refills | Status: AC | PRN

## 2016-05-08 NOTE — Progress Notes (Signed)
Pre visit review using our clinic review tool, if applicable. No additional management support is needed unless otherwise documented below in the visit note. 
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
## 2016-05-08 NOTE — Patient Instructions (Signed)
A few things to remember from today's visit:   Left shoulder pain, unspecified chronicity - Plan: DG Shoulder Left  Other closed nondisplaced fracture of fourth cervical vertebra with routine healing, subsequent encounter - Plan: HYDROcodone-acetaminophen (NORCO/VICODIN) 5-325 MG tablet  Upper back pain on left side  Left-sided chest wall pain - Plan: DG Ribs Unilateral W/Chest Left  Continue Diclofenac Decrease Hydrocodone to 2 times daily and continue for up to 2 weeks, wean off.  Keep appointment with ortho and neurosurgeon.    Please be sure medication list is accurate. If a new problem present, please set up appointment sooner than planned today.

## 2016-05-17 ENCOUNTER — Other Ambulatory Visit: Payer: Self-pay

## 2016-05-17 DIAGNOSIS — S12391D Other nondisplaced fracture of fourth cervical vertebra, subsequent encounter for fracture with routine healing: Secondary | ICD-10-CM

## 2016-05-17 DIAGNOSIS — M25512 Pain in left shoulder: Secondary | ICD-10-CM

## 2016-05-24 ENCOUNTER — Other Ambulatory Visit: Payer: Self-pay | Admitting: Family Medicine
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
## 2016-05-31 ENCOUNTER — Ambulatory Visit (INDEPENDENT_AMBULATORY_CARE_PROVIDER_SITE_OTHER): Payer: BLUE CROSS/BLUE SHIELD | Admitting: Orthopedic Surgery

## 2016-05-31 ENCOUNTER — Encounter (INDEPENDENT_AMBULATORY_CARE_PROVIDER_SITE_OTHER): Payer: Self-pay | Admitting: Orthopedic Surgery

## 2016-05-31 DIAGNOSIS — M7502 Adhesive capsulitis of left shoulder: Secondary | ICD-10-CM | POA: Insufficient documentation

## 2016-05-31 DIAGNOSIS — M25512 Pain in left shoulder: Secondary | ICD-10-CM | POA: Diagnosis not present

## 2016-05-31 MED ORDER — HYDROCODONE-ACETAMINOPHEN 5-325 MG PO TABS: 1.0000 | ORAL_TABLET | Freq: Four times a day (QID) | ORAL | 0 refills | Status: DC | PRN
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
## 2016-06-12 ENCOUNTER — Other Ambulatory Visit: Payer: Self-pay | Admitting: Family Medicine

## 2016-06-12 DIAGNOSIS — M75102 Unspecified rotator cuff tear or rupture of left shoulder, not specified as traumatic: Secondary | ICD-10-CM

## 2016-06-15 ENCOUNTER — Ambulatory Visit
Admission: RE | Admit: 2016-06-15 | Discharge: 2016-06-15 | Disposition: A | Discharge status: Home / Self Care | Payer: BLUE CROSS/BLUE SHIELD | Source: Ambulatory Visit | Attending: Family Medicine | Admitting: Family Medicine

## 2016-06-15 DIAGNOSIS — M75102 Unspecified rotator cuff tear or rupture of left shoulder, not specified as traumatic: Secondary | ICD-10-CM
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
## 2016-07-06 ENCOUNTER — Ambulatory Visit: Payer: Self-pay | Admitting: Family Medicine

## 2016-07-09 ENCOUNTER — Ambulatory Visit: Payer: BLUE CROSS/BLUE SHIELD | Admitting: Family Medicine

## 2016-08-06 ENCOUNTER — Encounter: Payer: Self-pay | Admitting: Family Medicine

## 2016-08-06 ENCOUNTER — Ambulatory Visit (INDEPENDENT_AMBULATORY_CARE_PROVIDER_SITE_OTHER): Payer: BLUE CROSS/BLUE SHIELD | Admitting: Family Medicine

## 2016-08-06 VITALS — BP 126/80 | HR 74 | Resp 12 | Ht 65.0 in | Wt 171.1 lb

## 2016-08-06 DIAGNOSIS — F319 Bipolar disorder, unspecified: Secondary | ICD-10-CM | POA: Insufficient documentation

## 2016-08-06 DIAGNOSIS — F339 Major depressive disorder, recurrent, unspecified: Secondary | ICD-10-CM | POA: Diagnosis not present

## 2016-08-06 DIAGNOSIS — M542 Cervicalgia: Secondary | ICD-10-CM | POA: Insufficient documentation

## 2016-08-06 DIAGNOSIS — M25512 Pain in left shoulder: Secondary | ICD-10-CM | POA: Diagnosis not present

## 2016-08-06 MED ORDER — DICLOFENAC SODIUM 75 MG PO TBEC: 75.0000 mg | DELAYED_RELEASE_TABLET | Freq: Two times a day (BID) | ORAL | 0 refills | Status: DC

## 2016-08-06 MED ORDER — DULOXETINE HCL 30 MG PO CPEP: 30.0000 mg | ORAL_CAPSULE | Freq: Every day | ORAL | 1 refills | Status: DC

## 2016-08-06 MED ORDER — BACLOFEN 10 MG PO TABS: 10.0000 mg | ORAL_TABLET | Freq: Three times a day (TID) | ORAL | 0 refills | Status: DC | PRN

## 2016-08-06 NOTE — Patient Instructions (Signed)
A few things to remember from today's visit:   Left shoulder pain, unspecified chronicity - Plan: diclofenac (VOLTAREN) 75 MG EC tablet  Cervicalgia - Plan: DULoxetine (CYMBALTA) 30 MG capsule, diclofenac (VOLTAREN) 75 MG EC tablet, baclofen (LIORESAL) 10 MG tablet  Recurrent major depressive disorder, remission status unspecified ([place]) - Plan: DULoxetine (CYMBALTA) 30 MG capsule  Keep appt with ortho.  Diclofenac with food. Baclofen can cause drowsiness.  Today we started Cymbalta, this type of medications can increase suicidal risk. This is more prevalent among children,adolecents, and young adults with major depression or other psychiatric disorders. It can also make depression worse. Most common side effects are gastrointestinal, self limited after a few weeks: diarrhea, nausea, constipation  Or diarrhea among some.  In general it is well tolerated. We will follow closely.      Please be sure medication list is accurate. If a new problem present, please set up appointment sooner than planned today.

## 2016-08-06 NOTE — Progress Notes (Signed)
Pre visit review using our clinic review tool, if applicable. No additional management support is needed unless otherwise documented below in the visit note. 
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
## 2016-09-10 ENCOUNTER — Ambulatory Visit: Payer: BLUE CROSS/BLUE SHIELD | Admitting: Family Medicine
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
## 2016-10-15 ENCOUNTER — Ambulatory Visit (INDEPENDENT_AMBULATORY_CARE_PROVIDER_SITE_OTHER): Payer: BLUE CROSS/BLUE SHIELD | Admitting: Family Medicine

## 2016-10-15 ENCOUNTER — Encounter: Payer: Self-pay | Admitting: Family Medicine

## 2016-10-15 ENCOUNTER — Telehealth: Payer: Self-pay

## 2016-10-15 VITALS — BP 130/80 | HR 88 | Resp 12 | Ht 65.0 in | Wt 170.0 lb

## 2016-10-15 DIAGNOSIS — M545 Low back pain, unspecified: Secondary | ICD-10-CM | POA: Insufficient documentation

## 2016-10-15 DIAGNOSIS — M542 Cervicalgia: Secondary | ICD-10-CM | POA: Diagnosis not present

## 2016-10-15 DIAGNOSIS — M5442 Lumbago with sciatica, left side: Secondary | ICD-10-CM | POA: Diagnosis not present

## 2016-10-15 DIAGNOSIS — N951 Menopausal and female climacteric states: Secondary | ICD-10-CM

## 2016-10-15 DIAGNOSIS — F339 Major depressive disorder, recurrent, unspecified: Secondary | ICD-10-CM

## 2016-10-15 DIAGNOSIS — M5441 Lumbago with sciatica, right side: Secondary | ICD-10-CM

## 2016-10-15 DIAGNOSIS — G8929 Other chronic pain: Secondary | ICD-10-CM

## 2016-10-15 MED ORDER — PREDNISONE 20 MG PO TABS: ORAL_TABLET | ORAL | 0 refills | Status: AC

## 2016-10-15 MED ORDER — METHOCARBAMOL 500 MG PO TABS: 500.0000 mg | ORAL_TABLET | Freq: Three times a day (TID) | ORAL | 1 refills | Status: DC | PRN

## 2016-10-15 MED ORDER — DULOXETINE HCL 60 MG PO CPEP: 60.0000 mg | ORAL_CAPSULE | Freq: Every day | ORAL | 1 refills | Status: DC

## 2016-10-15 MED ORDER — TIZANIDINE HCL 4 MG PO TABS: 4.0000 mg | ORAL_TABLET | Freq: Three times a day (TID) | ORAL | 1 refills | Status: DC | PRN

## 2016-10-15 NOTE — Telephone Encounter (Signed)
Pharmacy called to let us know that the Robaxin is on back order & wanted to know if it can be changed to something different.

## 2016-10-15 NOTE — Patient Instructions (Signed)
A few things to remember from today's visit:   Cervicalgia - Plan: DULoxetine (CYMBALTA) 60 MG capsule, methocarbamol (ROBAXIN) 500 MG tablet, Ambulatory referral to Neurosurgery  Recurrent major depressive disorder, remission status unspecified ([place]) - Plan: DULoxetine (CYMBALTA) 60 MG capsule  Chronic bilateral low back pain with bilateral sciatica - Plan: methocarbamol (ROBAXIN) 500 MG tablet, Ambulatory referral to Neurosurgery, predniSONE (DELTASONE) 20 MG tablet  Takes medications with food.  Cymbalta increased.  Keep appt with ortho, Dr Sabra Heck.   Please be sure medication list is accurate. If a new problem present, please set up appointment sooner than planned today.

## 2016-10-16 NOTE — Telephone Encounter (Signed)
I sent Rx for Zanaflex. Thanks, BJ

## 2016-11-14 ENCOUNTER — Telehealth: Payer: Self-pay | Admitting: Family Medicine

## 2016-11-14 NOTE — Telephone Encounter (Signed)
Pt would like a referral to phychiatry.

## 2016-11-14 NOTE — Telephone Encounter (Signed)
We can't do referrals to psychiatry any longer. She'll just have to find a place that accepts her insurance.

## 2016-11-15 NOTE — Telephone Encounter (Signed)
Pt is aware to make her own appt

## 2016-11-18 ENCOUNTER — Encounter (HOSPITAL_COMMUNITY): Payer: Self-pay | Admitting: *Deleted

## 2016-11-18 ENCOUNTER — Emergency Department (HOSPITAL_COMMUNITY)
Admission: EM | Admit: 2016-11-18 | Discharge: 2016-11-18 | Disposition: A | Discharge status: Home / Self Care | Payer: BLUE CROSS/BLUE SHIELD | Attending: Emergency Medicine | Admitting: Emergency Medicine

## 2016-11-18 DIAGNOSIS — M545 Low back pain: Secondary | ICD-10-CM

## 2016-11-18 DIAGNOSIS — M542 Cervicalgia: Secondary | ICD-10-CM | POA: Diagnosis not present

## 2016-11-18 DIAGNOSIS — Z79899 Other long term (current) drug therapy: Secondary | ICD-10-CM | POA: Insufficient documentation

## 2016-11-18 DIAGNOSIS — G8929 Other chronic pain: Secondary | ICD-10-CM

## 2016-11-18 DIAGNOSIS — Z87891 Personal history of nicotine dependence: Secondary | ICD-10-CM | POA: Diagnosis not present

## 2016-11-18 DIAGNOSIS — I10 Essential (primary) hypertension: Secondary | ICD-10-CM | POA: Diagnosis not present

## 2016-11-18 DIAGNOSIS — M25512 Pain in left shoulder: Secondary | ICD-10-CM | POA: Insufficient documentation

## 2016-11-18 MED ORDER — KETOROLAC TROMETHAMINE 30 MG/ML IJ SOLN
30.0000 mg | Freq: Once | INTRAMUSCULAR | Status: AC
  Administered 2016-11-18: 30 mg via INTRAMUSCULAR
  Filled 2016-11-18: qty 1

## 2016-11-18 MED ORDER — HYDROCODONE-ACETAMINOPHEN 5-325 MG PO TABS: 1.0000 | ORAL_TABLET | Freq: Four times a day (QID) | ORAL | 0 refills | Status: DC | PRN

## 2016-11-18 MED ORDER — MORPHINE SULFATE (PF) 4 MG/ML IV SOLN
6.0000 mg | Freq: Once | INTRAVENOUS | Status: AC
  Administered 2016-11-18: 6 mg via INTRAMUSCULAR
  Filled 2016-11-18: qty 2

## 2016-11-18 MED ORDER — DEXAMETHASONE 4 MG PO TABS
12.0000 mg | ORAL_TABLET | Freq: Once | ORAL | Status: AC
  Administered 2016-11-18: 12 mg via ORAL
  Filled 2016-11-18: qty 3

## 2016-11-18 MED ORDER — IBUPROFEN 600 MG PO TABS: 600.0000 mg | ORAL_TABLET | Freq: Four times a day (QID) | ORAL | 0 refills | Status: DC | PRN

## 2016-11-18 NOTE — ED Notes (Signed)
Pt verbalized understanding discharge instructions and denies any further needs or questions at this time. VS stable, ambulatory and steady gait.   
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
## 2016-12-17 ENCOUNTER — Ambulatory Visit: Payer: BLUE CROSS/BLUE SHIELD | Admitting: Family Medicine
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
## 2016-12-25 ENCOUNTER — Ambulatory Visit: Payer: BLUE CROSS/BLUE SHIELD | Admitting: Family Medicine
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
## 2017-01-07 ENCOUNTER — Ambulatory Visit (INDEPENDENT_AMBULATORY_CARE_PROVIDER_SITE_OTHER): Payer: BLUE CROSS/BLUE SHIELD | Admitting: Family Medicine

## 2017-01-07 ENCOUNTER — Encounter: Payer: Self-pay | Admitting: Family Medicine

## 2017-01-07 VITALS — BP 120/90 | HR 79 | Resp 12 | Ht 65.0 in | Wt 168.5 lb

## 2017-01-07 DIAGNOSIS — M542 Cervicalgia: Secondary | ICD-10-CM | POA: Diagnosis not present

## 2017-01-07 DIAGNOSIS — R030 Elevated blood-pressure reading, without diagnosis of hypertension: Secondary | ICD-10-CM

## 2017-01-07 DIAGNOSIS — R51 Headache: Secondary | ICD-10-CM

## 2017-01-07 DIAGNOSIS — R110 Nausea: Secondary | ICD-10-CM | POA: Diagnosis not present

## 2017-01-07 DIAGNOSIS — R519 Headache, unspecified: Secondary | ICD-10-CM

## 2017-01-07 MED ORDER — ONDANSETRON HCL 4 MG/2ML IJ SOLN
4.0000 mg | Freq: Once | INTRAMUSCULAR | Status: AC
  Administered 2017-01-07: 4 mg via INTRAMUSCULAR

## 2017-01-07 MED ORDER — KETOROLAC TROMETHAMINE 60 MG/2ML IM SOLN
30.0000 mg | Freq: Once | INTRAMUSCULAR | Status: AC
  Administered 2017-01-07: 30 mg via INTRAMUSCULAR

## 2017-01-07 MED ORDER — TIZANIDINE HCL 4 MG PO TABS: 4.0000 mg | ORAL_TABLET | Freq: Three times a day (TID) | ORAL | 1 refills | Status: DC | PRN

## 2017-01-07 NOTE — Patient Instructions (Addendum)
A few things to remember from today's visit:   Occipital headache  Cervicalgia - Plan: tiZANidine (ZANAFLEX) 4 MG tablet  Nausea without vomiting  Chronic bilateral low back pain with bilateral sciatica   General Headache Without Cause A headache is pain or discomfort felt around the head or neck area. There are many causes and types of headaches. In some cases, the cause may not be found. Follow these instructions at home: Managing pain  Take over-the-counter and prescription medicines only as told by your doctor.  Lie down in a dark, quiet room when you have a headache.  If directed, apply ice to the head and neck area: ? Put ice in a plastic bag. ? Place a towel between your skin and the bag. ? Leave the ice on for 20 minutes, 2-3 times per day.  Use a heating pad or hot shower to apply heat to the head and neck area as told by your doctor.  Keep lights dim if bright lights bother you or make your headaches worse. Eating and drinking  Eat meals on a regular schedule.  Lessen how much alcohol you drink.  Lessen how much caffeine you drink, or stop drinking caffeine. General instructions  Keep all follow-up visits as told by your doctor. This is important.  Keep a journal to find out if certain things bring on headaches. For example, write down: ? What you eat and drink. ? How much sleep you get. ? Any change to your diet or medicines.  Relax by getting a massage or doing other relaxing activities.  Lessen stress.  Sit up straight. Do not tighten (tense) your muscles.  Do not use tobacco products. This includes cigarettes, chewing tobacco, or e-cigarettes. If you need help quitting, ask your doctor.  Exercise regularly as told by your doctor.  Get enough sleep. This often means 7-9 hours of sleep. Contact a doctor if:  Your symptoms are not helped by medicine.  You have a headache that feels different than the other headaches.  You feel sick to your  stomach (nauseous) or you throw up (vomit).  You have a fever. Get help right away if:  Your headache becomes really bad.  You keep throwing up.  You have a stiff neck.  You have trouble seeing.  You have trouble speaking.  You have pain in the eye or ear.  Your muscles are weak or you lose muscle control.  You lose your balance or have trouble walking.  You feel like you will pass out (faint) or you pass out.  You have confusion. This information is not intended to replace advice given to you by your health care provider. Make sure you discuss any questions you have with your health care provider. Document Released: 12/27/2007 Document Revised: 08/25/2015 Document Reviewed: 07/12/2014 Elsevier Interactive Patient Education  [employer].  Please be sure medication list is accurate. If a new problem present, please set up appointment sooner than planned today.

## 2017-01-09 ENCOUNTER — Ambulatory Visit: Payer: BLUE CROSS/BLUE SHIELD | Admitting: Family Medicine

## 2017-04-29 ENCOUNTER — Ambulatory Visit (INDEPENDENT_AMBULATORY_CARE_PROVIDER_SITE_OTHER): Payer: BLUE CROSS/BLUE SHIELD | Admitting: Family Medicine

## 2017-04-29 ENCOUNTER — Encounter: Payer: Self-pay | Admitting: Family Medicine

## 2017-04-29 VITALS — BP 122/90 | HR 86 | Temp 98.2°F | Resp 12 | Ht 65.0 in | Wt 173.0 lb

## 2017-04-29 DIAGNOSIS — M722 Plantar fascial fibromatosis: Secondary | ICD-10-CM

## 2017-04-29 DIAGNOSIS — M5442 Lumbago with sciatica, left side: Secondary | ICD-10-CM | POA: Diagnosis not present

## 2017-04-29 DIAGNOSIS — G8929 Other chronic pain: Secondary | ICD-10-CM

## 2017-04-29 DIAGNOSIS — G894 Chronic pain syndrome: Secondary | ICD-10-CM | POA: Insufficient documentation

## 2017-04-29 DIAGNOSIS — F339 Major depressive disorder, recurrent, unspecified: Secondary | ICD-10-CM | POA: Diagnosis not present

## 2017-04-29 DIAGNOSIS — M7502 Adhesive capsulitis of left shoulder: Secondary | ICD-10-CM | POA: Diagnosis not present

## 2017-04-29 DIAGNOSIS — M542 Cervicalgia: Secondary | ICD-10-CM | POA: Diagnosis not present

## 2017-04-29 DIAGNOSIS — M5441 Lumbago with sciatica, right side: Secondary | ICD-10-CM | POA: Diagnosis not present

## 2017-04-29 MED ORDER — GABAPENTIN 300 MG PO CAPS: 300.0000 mg | ORAL_CAPSULE | Freq: Every day | ORAL | 1 refills | Status: DC

## 2017-04-29 MED ORDER — MELOXICAM 15 MG PO TABS: 15.0000 mg | ORAL_TABLET | Freq: Every day | ORAL | 1 refills | Status: DC

## 2017-04-29 MED ORDER — BACLOFEN 10 MG PO TABS: 10.0000 mg | ORAL_TABLET | Freq: Three times a day (TID) | ORAL | 1 refills | Status: DC | PRN
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
## 2017-04-29 NOTE — Assessment & Plan Note (Signed)
She is taking Cymbalta daily, so she will discontinue. Instructed to keep appointment with psychiatrist. Educated about warning signs.

## 2017-04-29 NOTE — Patient Instructions (Addendum)
A few things to remember from today's visit:   Chronic bilateral low back pain with bilateral sciatica - Plan: meloxicam (MOBIC) 15 MG tablet, gabapentin (NEURONTIN) 300 MG capsule, Ambulatory referral to Pain Clinic  Recurrent major depressive disorder, remission status unspecified ([place])  Cervicalgia - Plan: baclofen (LIORESAL) 10 MG tablet, meloxicam (MOBIC) 15 MG tablet, Ambulatory referral to Pain Clinic  Chronic pain disorder  Recommend meloxicam 15 mg daily as needed.  Try to monitor blood pressure at home. Acetaminophen 500 mg 4 times per day. Baclofen can cause drowsiness, you can take it 3 times per day as needed. I placed referral for pain management. I will see you back in 2 months if you have not established with pain management.  Please be sure medication list is accurate. If a new problem present, please set up appointment sooner than planned today.

## 2017-04-29 NOTE — Assessment & Plan Note (Addendum)
Chronic and with radicular pain. According to patient, she is no longer following with neurosurgeon. Baclofen side effects discussed. Gabapentin might help with LUE burning sensation. Continue ROM/PT exercises.

## 2017-04-29 NOTE — Assessment & Plan Note (Signed)
She did not tolerate Cymbalta well. After discussion of some side effects, she agrees with trying Gabapentin 300 mg at bedtime. Gabapentin can be titrated up as tolerated. Referral to pain management placed today.

## 2017-04-29 NOTE — Assessment & Plan Note (Signed)
No major improvement with PT. Recommend continuing ROM exercises. We discussed diagnosis and prognosis.

## 2017-05-18 ENCOUNTER — Encounter (HOSPITAL_COMMUNITY): Payer: Self-pay

## 2017-05-18 ENCOUNTER — Emergency Department (HOSPITAL_COMMUNITY)
Admission: EM | Admit: 2017-05-18 | Discharge: 2017-05-18 | Disposition: A | Discharge status: Home / Self Care | Payer: BLUE CROSS/BLUE SHIELD | Attending: Emergency Medicine | Admitting: Emergency Medicine

## 2017-05-18 ENCOUNTER — Emergency Department (HOSPITAL_COMMUNITY): Payer: BLUE CROSS/BLUE SHIELD

## 2017-05-18 DIAGNOSIS — H5711 Ocular pain, right eye: Secondary | ICD-10-CM | POA: Diagnosis not present

## 2017-05-18 DIAGNOSIS — Z79899 Other long term (current) drug therapy: Secondary | ICD-10-CM | POA: Insufficient documentation

## 2017-05-18 DIAGNOSIS — Z7982 Long term (current) use of aspirin: Secondary | ICD-10-CM | POA: Diagnosis not present

## 2017-05-18 DIAGNOSIS — Z87891 Personal history of nicotine dependence: Secondary | ICD-10-CM | POA: Diagnosis not present

## 2017-05-18 DIAGNOSIS — H538 Other visual disturbances: Secondary | ICD-10-CM | POA: Diagnosis not present

## 2017-05-18 MED ORDER — FLUORESCEIN SODIUM 1 MG OP STRP
1.0000 | ORAL_STRIP | Freq: Once | OPHTHALMIC | Status: AC
  Administered 2017-05-18: 1 via OPHTHALMIC
  Filled 2017-05-18: qty 1

## 2017-05-18 MED ORDER — TETRACAINE HCL 0.5 % OP SOLN
1.0000 [drp] | Freq: Once | OPHTHALMIC | Status: AC
  Administered 2017-05-18: 2 [drp] via OPHTHALMIC
  Filled 2017-05-18: qty 4

## 2017-05-18 NOTE — ED Notes (Signed)
Pt advises she does not wear glasses or contacts.

## 2017-05-18 NOTE — ED Notes (Signed)
Patient transported to CT 
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
## 2017-05-18 NOTE — Discharge Instructions (Signed)
Please go to Dr. Zenia Resides office tomorrow at 12pm.

## 2017-05-18 NOTE — ED Triage Notes (Signed)
Patient complains of 2 weeks of right eye pain with blurriness. Denies drainage, denies trauma. Does not wear contacts. Alert and oiented

## 2017-05-27 ENCOUNTER — Telehealth: Payer: Self-pay | Admitting: Family Medicine

## 2017-05-27 NOTE — Telephone Encounter (Signed)
Copied from [place] [id number]. Topic: Inquiry >> May 27, 2017  9:36 AM Margot Ables wrote: Reason for CRM: pt called in requesting a pain medication. She states she is taking the muscle relaxer, gabapentin, and meloxicam from 04/29/17 but needs something stronger bc her neck is hurting so bad. Please advise pt.  [id number] [address], [address] [facility] [address] [phone] (Phone) [phone] (Fax)

## 2017-05-27 NOTE — Telephone Encounter (Signed)
I recommend referral to pain clinic.  Thanks, BJ

## 2017-05-28 NOTE — Telephone Encounter (Signed)
Left message to return call to clinic concerning medication request.

## 2017-05-29 NOTE — Telephone Encounter (Signed)
Tried calling patient again, left message to give clinic a call back.

## 2017-05-31 NOTE — Telephone Encounter (Signed)
Pt returning Quaneisha's call

## 2017-05-31 NOTE — Telephone Encounter (Signed)
Spoke with patient, patient aware that Dr. [place] will not be sending any medications to pharmacy, patient has been referred to pain clinic. Patient verbalized understanding.

## 2017-07-01 ENCOUNTER — Encounter: Payer: Self-pay | Admitting: Family Medicine

## 2017-07-01 ENCOUNTER — Ambulatory Visit (INDEPENDENT_AMBULATORY_CARE_PROVIDER_SITE_OTHER): Payer: BLUE CROSS/BLUE SHIELD | Admitting: Family Medicine

## 2017-07-01 VITALS — BP 133/82 | HR 83 | Temp 97.8°F | Resp 12 | Ht 65.0 in | Wt 174.0 lb

## 2017-07-01 DIAGNOSIS — M5442 Lumbago with sciatica, left side: Secondary | ICD-10-CM | POA: Diagnosis not present

## 2017-07-01 DIAGNOSIS — G894 Chronic pain syndrome: Secondary | ICD-10-CM | POA: Diagnosis not present

## 2017-07-01 DIAGNOSIS — M542 Cervicalgia: Secondary | ICD-10-CM | POA: Diagnosis not present

## 2017-07-01 DIAGNOSIS — G8929 Other chronic pain: Secondary | ICD-10-CM | POA: Diagnosis not present

## 2017-07-01 DIAGNOSIS — M5441 Lumbago with sciatica, right side: Secondary | ICD-10-CM

## 2017-07-01 MED ORDER — GABAPENTIN 300 MG PO CAPS: 300.0000 mg | ORAL_CAPSULE | Freq: Three times a day (TID) | ORAL | 1 refills | Status: DC

## 2017-07-01 NOTE — Patient Instructions (Addendum)
A few things to remember from today's visit:   Chronic pain disorder  Chronic bilateral low back pain with bilateral sciatica - Plan: gabapentin (NEURONTIN) 300 MG capsule, MR Lumbar Spine Wo Contrast  Cervicalgia   Tylenol arthritis 3 times per day. Continue Meloxicam daily. Continue Baclofen 10 mg 3 times per day as needed,causes drowsiness.  Icy hot patches may help.  Gabapentin increased to 2 tabs daily and in a week to 3 tabs daily.   Sent to work que - once review pt will get a call to schedule 3-4 weeks for review  [facility] [place] Address: [address], [place], [place] [address] Phone: [phone]   Please be sure medication list is accurate. If a new problem present, please set up appointment sooner than planned today.

## 2017-07-01 NOTE — Progress Notes (Signed)
HPI:   Patricia [name] is a 47 y.o. female, who is here today for 4 months follow up.   She was last seen on 04/29/17  Since her last OV she has followed with ortho and has done PT for left shoulder adhesive capsulitis. Still with marked limitation of ROM.  She has not received information about pain clinic appt.  Taking Baclofen 10 mg tid. Gabapentin 300 mg at bedtime.  Tolerating medications well,no side effect reported.  Meloxicam helps some with pain ,it is not as bad when she takes it but still having pain. Neck pain 4-5/10,left shoulder 4/10, and lower back 4/10. Lower back pain still radiating to LE's "a lot." Burning like sensation. She has not had lumbar spine imaging.  Pain is constant. It is exacerbated by movement and alleviated by rest. Negative for saddle anesthesia or urine/bowel incontinence.   She also follows with neurologist, Dr Jaynee Eagles.    Review of Systems  Constitutional: Positive for fatigue. Negative for chills and fever.  HENT: Negative for mouth sores, sore throat and trouble swallowing.   Respiratory: Negative for cough, shortness of breath and wheezing.   Cardiovascular: Negative for chest pain, palpitations and leg swelling.  Gastrointestinal: Negative for nausea and vomiting.  Musculoskeletal: Positive for back pain, neck pain and neck stiffness. Negative for arthralgias and joint swelling.  Skin: Negative for rash.  Neurological: Negative for syncope, weakness and headaches.  Psychiatric/Behavioral: Positive for sleep disturbance. Negative for confusion. The patient is nervous/anxious.       Current Outpatient Medications on File Prior to Visit  Medication Sig Dispense Refill  . aspirin 81 MG chewable tablet Chew 81 mg by mouth daily as needed for mild pain.    . baclofen (LIORESAL) 10 MG tablet Take 1 tablet (10 mg total) by mouth 3 (three) times daily as needed for muscle spasms. 90 each 1  . meloxicam (MOBIC) 15 MG tablet  Take 1 tablet (15 mg total) by mouth daily. 30 tablet 1  . Multiple Vitamins-Minerals (MULTIVITAMIN WITH MINERALS) tablet Take 1 tablet by mouth daily.    . valACYclovir (VALTREX) 1000 MG tablet Take 1 tablet (1,000 mg total) by mouth 3 (three) times daily. 21 tablet 0   No current facility-administered medications on file prior to visit.      Past Medical History:  Diagnosis Date  . Pregnancy induced hypertension   . Seizure disorder ([place])    No Known Allergies  Social History   Socioeconomic History  . Marital status: Single    Spouse name: Not on file  . Number of children: Not on file  . Years of education: Not on file  . Highest education level: Not on file  Occupational History  . Not on file  Social Needs  . Financial resource strain: Not on file  . Food insecurity:    Worry: Not on file    Inability: Not on file  . Transportation needs:    Medical: Not on file    Non-medical: Not on file  Tobacco Use  . Smoking status: Former Smoker    Start date: 08/31/2016  . Smokeless tobacco: Never Used  Substance and Sexual Activity  . Alcohol use: No  . Drug use: Yes    Types: Marijuana  . Sexual activity: Yes    Birth control/protection: None  Lifestyle  . Physical activity:    Days per week: Not on file    Minutes per session: Not on file  .  Stress: Not on file  Relationships  . Social connections:    Talks on phone: Not on file    Gets together: Not on file    Attends religious service: Not on file    Active member of club or organization: Not on file    Attends meetings of clubs or organizations: Not on file    Relationship status: Not on file  Other Topics Concern  . Not on file  Social History Narrative  . Not on file    Vitals:   07/01/17 0713  BP: 133/82  Pulse: 83  Resp: 12  Temp: 97.8 F (36.6 C)  SpO2: 99%   Body mass index is 28.96 kg/m.   Physical Exam  Nursing note and vitals reviewed. Constitutional: She is oriented to person,  place, and time. She appears well-developed. She does not appear ill. No distress.  HENT:  Head: Normocephalic and atraumatic.  Mouth/Throat: Oropharynx is clear and moist and mucous membranes are normal.  Eyes: Pupils are equal, round, and reactive to light. Conjunctivae are normal.  Cardiovascular: Normal rate and regular rhythm.  No murmur heard. Respiratory: Effort normal and breath sounds normal. No respiratory distress.  GI: Soft. She exhibits no mass. There is no hepatomegaly. There is no tenderness.  Musculoskeletal: She exhibits no edema.       Left shoulder: She exhibits decreased range of motion and tenderness.       Cervical back: She exhibits decreased range of motion and tenderness.       Lumbar back: She exhibits tenderness.       Back:  There is  tenderness upon palpation of cervical and lumbar paraspinal muscles. Mild limitation of cervical range of motion. Marked limitation of left shoulder ROM.     Lymphadenopathy:    She has no cervical adenopathy.  Neurological: She is alert and oriented to person, place, and time. She has normal strength. Gait normal.  Reflex Scores:      Patellar reflexes are 2+ on the right side and 2+ on the left side. RSL negative bilateral.  Skin: Skin is warm. No rash noted. No erythema.  Psychiatric: Her mood appears anxious.  Poor groomed, good eye contact.       ASSESSMENT AND PLAN:   Patricia [name] was seen today for 4 months follow-up.  Orders Placed This Encounter  Procedures  . MR Lumbar Spine Wo Contrast    1. Chronic pain disorder  Information about [facility] Medicine and rehabilitation given, recommend calling and find out if appt has been arranged.   2. Chronic bilateral low back pain with bilateral sciatica  Gabapentin to be titrated up to 300 mg tid. Some side effects discussed. Lumbar MRI will be arranged. Tylenol Arthritis tid and OTC Icy hot patch may also help. F/U in 4 weeks if still does not  have appt with pain management.  - gabapentin (NEURONTIN) 300 MG capsule; Take 1 capsule (300 mg total) by mouth 3 (three) times daily.  Dispense: 90 capsule; Refill: 1 - MR Lumbar Spine Wo Contrast; Future  3. Cervicalgia  Still limited ROM. Recommend doing PT exercises at home to improve ROM (cervical and left shoulder). Continue Baclofen and Meloxicam. She has appt with ortho on 07/08/17.    -Patricia [name] was advised to return sooner than planned today if new concerns arise.       Maridel Pixler G. [place], MD  [facility]. [place] office.

## 2017-07-15 ENCOUNTER — Ambulatory Visit: Payer: BLUE CROSS/BLUE SHIELD | Admitting: Physical Medicine & Rehabilitation

## 2017-07-15 ENCOUNTER — Encounter: Payer: Self-pay | Admitting: Physical Medicine & Rehabilitation

## 2017-07-15 ENCOUNTER — Encounter: Payer: BLUE CROSS/BLUE SHIELD | Attending: Physical Medicine & Rehabilitation

## 2017-07-15 VITALS — BP 134/92 | HR 77 | Ht 63.0 in | Wt 175.6 lb

## 2017-07-15 DIAGNOSIS — G8929 Other chronic pain: Secondary | ICD-10-CM | POA: Diagnosis present

## 2017-07-15 DIAGNOSIS — Z9889 Other specified postprocedural states: Secondary | ICD-10-CM | POA: Diagnosis not present

## 2017-07-15 DIAGNOSIS — Z5181 Encounter for therapeutic drug level monitoring: Secondary | ICD-10-CM | POA: Diagnosis not present

## 2017-07-15 DIAGNOSIS — M7502 Adhesive capsulitis of left shoulder: Secondary | ICD-10-CM | POA: Diagnosis not present

## 2017-07-15 DIAGNOSIS — M545 Low back pain: Secondary | ICD-10-CM | POA: Diagnosis not present

## 2017-07-15 DIAGNOSIS — F17210 Nicotine dependence, cigarettes, uncomplicated: Secondary | ICD-10-CM | POA: Diagnosis not present

## 2017-07-15 DIAGNOSIS — M47816 Spondylosis without myelopathy or radiculopathy, lumbar region: Secondary | ICD-10-CM
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
## 2017-07-15 NOTE — Patient Instructions (Addendum)
Will start with Right  sided lumbar medial branch blocks, may need to do left side if right side pain relieved

## 2017-07-18 LAB — DRUG TOX MONITOR 1 W/CONF, ORAL FLD
Amphetamines: NEGATIVE ng/mL (ref <10)
Barbiturates: NEGATIVE ng/mL (ref <10)
Benzodiazepines: NEGATIVE ng/mL (ref <0.50)
Buprenorphine: NEGATIVE ng/mL (ref <0.10)
COTININE: 83.3 ng/mL — AB (ref <5.0)
Cocaine: NEGATIVE ng/mL (ref <5.0)
FENTANYL: NEGATIVE ng/mL (ref <0.10)
HEROIN METABOLITE: NEGATIVE ng/mL (ref <1.0)
MARIJUANA: NEGATIVE ng/mL (ref <2.5)
MDMA: NEGATIVE ng/mL (ref <10)
Meprobamate: NEGATIVE ng/mL (ref <2.5)
Methadone: NEGATIVE ng/mL (ref <5.0)
NICOTINE METABOLITE: POSITIVE ng/mL — AB (ref <5.0)
OPIATES: NEGATIVE ng/mL (ref <2.5)
PHENCYCLIDINE: NEGATIVE ng/mL (ref <10)
TAPENTADOL: NEGATIVE ng/mL (ref <5.0)
TRAMADOL: NEGATIVE ng/mL (ref <5.0)
Zolpidem: NEGATIVE ng/mL (ref <5.0)

## 2017-07-18 LAB — DRUG TOX ALC METAB W/CON, ORAL FLD: Alcohol Metabolite: NEGATIVE ng/mL (ref <25)

## 2017-07-18 LAB — DRUG TOX METHYLPHEN W/CONF,ORAL FLD: METHYLPHENIDATE: NEGATIVE ng/mL (ref <1.0)

## 2017-07-29 ENCOUNTER — Ambulatory Visit: Payer: BLUE CROSS/BLUE SHIELD | Admitting: Neurology

## 2017-07-29 ENCOUNTER — Telehealth: Payer: Self-pay | Admitting: *Deleted

## 2017-07-29 NOTE — Telephone Encounter (Signed)
Pt no showed new pt appt on 07/29/2017 @ 1:30 pm.

## 2017-07-30 ENCOUNTER — Encounter: Payer: Self-pay | Admitting: Neurology

## 2017-08-09 ENCOUNTER — Other Ambulatory Visit: Payer: Self-pay

## 2017-08-09 ENCOUNTER — Ambulatory Visit (HOSPITAL_BASED_OUTPATIENT_CLINIC_OR_DEPARTMENT_OTHER): Payer: BLUE CROSS/BLUE SHIELD | Admitting: Physical Medicine & Rehabilitation

## 2017-08-09 ENCOUNTER — Encounter: Payer: BLUE CROSS/BLUE SHIELD | Attending: Physical Medicine & Rehabilitation

## 2017-08-09 VITALS — BP 136/95 | HR 75 | Ht 63.0 in | Wt 178.0 lb

## 2017-08-09 DIAGNOSIS — G8929 Other chronic pain: Secondary | ICD-10-CM | POA: Diagnosis present

## 2017-08-09 DIAGNOSIS — Z5181 Encounter for therapeutic drug level monitoring: Secondary | ICD-10-CM | POA: Diagnosis not present

## 2017-08-09 DIAGNOSIS — Z9889 Other specified postprocedural states: Secondary | ICD-10-CM | POA: Diagnosis not present

## 2017-08-09 DIAGNOSIS — F17210 Nicotine dependence, cigarettes, uncomplicated: Secondary | ICD-10-CM | POA: Insufficient documentation

## 2017-08-09 DIAGNOSIS — M47816 Spondylosis without myelopathy or radiculopathy, lumbar region: Secondary | ICD-10-CM | POA: Insufficient documentation

## 2017-08-09 DIAGNOSIS — M7502 Adhesive capsulitis of left shoulder: Secondary | ICD-10-CM | POA: Insufficient documentation

## 2017-08-09 DIAGNOSIS — M545 Low back pain: Secondary | ICD-10-CM | POA: Diagnosis present

## 2017-08-09 NOTE — Patient Instructions (Signed)

## 2017-08-09 NOTE — Progress Notes (Signed)
Right lumbar L3, L4 medial branch blocks and L5 dorsal ramus injection under fluoroscopic guidance  Indication: Right Lumbar pain which is not relieved by medication management or other conservative care and interfering with self-care and mobility.  Informed consent was obtained after describing risks and benefits of the procedure with the patient, this includes bleeding, bruising, infection, paralysis and medication side effects. The patient wishes to proceed and has given written consent. The patient was placed in a prone position. The lumbar area was marked and prepped with Betadine. One ML of 1% lidocaine was injected into each of 3 areas into the skin and subcutaneous tissue. Then a 22-gauge 3.5 in spinal needle was inserted targeting the junction of the Right S1 superior articular process and sacral ala junction. Needle was advanced under fluoroscopic guidance. Bone contact was made.Isovue 200 was injected x0.5 mL demonstrating no intravascular uptake. Then a solution containing 2% MPF lidocaine was injected x0.5 mL. Then the Right L5 superior articular process in transverse process junction was targeted. Bone contact was made.Isovue 200 was injected x0.5 mL demonstrating no intravascular uptake. Then a solution containing 2% MPF lidocaine was injected x0.5 mL. Then the Right L4 superior articular process in transverse process junction was targeted. Bone contact was made. Isovue 200 was injected x0.5 mL demonstrating no intravascular uptake. Then a solution containing2% MPF lidocaine was injected x0.5 mL Patient tolerated procedure well. Post procedure instructions were given. Please refer to post procedure form. 

## 2017-08-09 NOTE — Progress Notes (Signed)
  PROCEDURE RECORD [facility]   Name: Patricia [name] DOB:23-Dec-1970 MRN: [id number]  Date:08/09/2017  Physician: Alysia Penna, MD    Nurse/CMA:Bright CMA  Allergies: No Known Allergies  Consent Signed: Yes.    Is patient diabetic? No.  CBG today? NA  Pregnant: No. LMP: Patient's last menstrual period was 07/30/2016. (age 49-55)  Anticoagulants: no Anti-inflammatory: no Antibiotics: no  Procedure:right L 3-4 medial branch block Position: Prone   Start Time: 221pm End Time: 229pm Fluoro Time: 42s  RN/CMA Shumaker RN Bright CMA    Time 1:59 235pm    BP 136/95 163/101    Pulse 75 74    Respirations 14 16    O2 Sat 97 97    S/S 6 6    Pain Level 7-8/10 1/10     D/C home with public transportation, patient A & O X 3, D/C instructions reviewed, and sits independently.

## 2017-08-21 ENCOUNTER — Telehealth: Payer: Self-pay | Admitting: Physical Medicine & Rehabilitation

## 2017-08-21 NOTE — Telephone Encounter (Signed)
Pt phoned requesting pain meds. She stated the injections were not helping and were making her blood pressure go up. Please advise.

## 2017-08-22 ENCOUNTER — Ambulatory Visit (INDEPENDENT_AMBULATORY_CARE_PROVIDER_SITE_OTHER): Payer: BLUE CROSS/BLUE SHIELD | Admitting: Family Medicine

## 2017-08-22 ENCOUNTER — Encounter: Payer: Self-pay | Admitting: Family Medicine

## 2017-08-22 VITALS — BP 130/90 | HR 80 | Temp 98.2°F | Resp 12 | Ht 63.0 in | Wt 176.2 lb

## 2017-08-22 DIAGNOSIS — Z6831 Body mass index (BMI) 31.0-31.9, adult: Secondary | ICD-10-CM | POA: Diagnosis not present

## 2017-08-22 DIAGNOSIS — F319 Bipolar disorder, unspecified: Secondary | ICD-10-CM | POA: Diagnosis not present

## 2017-08-22 DIAGNOSIS — G894 Chronic pain syndrome: Secondary | ICD-10-CM | POA: Diagnosis not present

## 2017-08-22 DIAGNOSIS — E669 Obesity, unspecified: Secondary | ICD-10-CM | POA: Insufficient documentation

## 2017-08-22 DIAGNOSIS — E6609 Other obesity due to excess calories: Secondary | ICD-10-CM

## 2017-08-22 DIAGNOSIS — I10 Essential (primary) hypertension: Secondary | ICD-10-CM | POA: Diagnosis not present

## 2017-08-22 MED ORDER — AMLODIPINE BESYLATE 5 MG PO TABS: 5.0000 mg | ORAL_TABLET | Freq: Every day | ORAL | 1 refills | Status: DC

## 2017-08-22 MED ORDER — TRAMADOL HCL 50 MG PO TABS: 50.0000 mg | ORAL_TABLET | Freq: Two times a day (BID) | ORAL | 0 refills | Status: DC

## 2017-08-22 NOTE — Assessment & Plan Note (Signed)
Recently diagnosed. Great improvement with Abilify 5 mg daily. Encouraged to continue following with psychiatrist and psychotherapist monthly.

## 2017-08-22 NOTE — Assessment & Plan Note (Signed)
We discussed benefits of wt loss as well as adverse effects of obesity. Consistency with healthy diet and physical activity recommended. Because she has not exercised regularly for a while, recommend starting slow and increased time as tolerated.  She can start walking in place for 5 minutes at the time.

## 2017-08-22 NOTE — Assessment & Plan Note (Addendum)
After discussion of few pharmacologic options, she agrees with trying Amlodipine 5 mg daily. We discussed some side effects. Low-salt/DASH diet recommended. She will continue monitoring BP at home. We discussed some side effects of NSAIDs in general. Smoking cessation is strongly recommended. Instructed about warning signs. Follow-up in 2 months, before if needed.

## 2017-08-22 NOTE — Patient Instructions (Signed)
A few things to remember from today's visit:   Hypertension, essential, benign - Plan: amLODipine (NORVASC) 5 MG tablet   DASH Eating Plan DASH stands for "Dietary Approaches to Stop Hypertension." The DASH eating plan is a healthy eating plan that has been shown to reduce high blood pressure (hypertension). It may also reduce your risk for type 2 diabetes, heart disease, and stroke. The DASH eating plan may also help with weight loss. What are tips for following this plan? General guidelines  Avoid eating more than 2,300 mg (milligrams) of salt (sodium) a day. If you have hypertension, you may need to reduce your sodium intake to 1,500 mg a day.  Limit alcohol intake to no more than 1 drink a day for nonpregnant women and 2 drinks a day for men. One drink equals 12 oz of beer, 5 oz of wine, or 1 oz of hard liquor.  Work with your health care provider to maintain a healthy body weight or to lose weight. Ask what an ideal weight is for you.  Get at least 30 minutes of exercise that causes your heart to beat faster (aerobic exercise) most days of the week. Activities may include walking, swimming, or biking.  Work with your health care provider or diet and nutrition specialist (dietitian) to adjust your eating plan to your individual calorie needs. Reading food labels  Check food labels for the amount of sodium per serving. Choose foods with less than 5 percent of the Daily Value of sodium. Generally, foods with less than 300 mg of sodium per serving fit into this eating plan.  To find whole grains, look for the word "whole" as the first word in the ingredient list. Shopping  Buy products labeled as "low-sodium" or "no salt added."  Buy fresh foods. Avoid canned foods and premade or frozen meals. Cooking  Avoid adding salt when cooking. Use salt-free seasonings or herbs instead of table salt or sea salt. Check with your health care provider or pharmacist before using salt  substitutes.  Do not fry foods. Cook foods using healthy methods such as baking, boiling, grilling, and broiling instead.  Cook with heart-healthy oils, such as olive, canola, soybean, or sunflower oil. Meal planning   Eat a balanced diet that includes: ? 5 or more servings of fruits and vegetables each day. At each meal, try to fill half of your plate with fruits and vegetables. ? Up to 6-8 servings of whole grains each day. ? Less than 6 oz of lean meat, poultry, or fish each day. A 3-oz serving of meat is about the same size as a deck of cards. One egg equals 1 oz. ? 2 servings of low-fat dairy each day. ? A serving of nuts, seeds, or beans 5 times each week. ? Heart-healthy fats. Healthy fats called Omega-3 fatty acids are found in foods such as flaxseeds and coldwater fish, like sardines, salmon, and mackerel.  Limit how much you eat of the following: ? Canned or prepackaged foods. ? Food that is high in trans fat, such as fried foods. ? Food that is high in saturated fat, such as fatty meat. ? Sweets, desserts, sugary drinks, and other foods with added sugar. ? Full-fat dairy products.  Do not salt foods before eating.  Try to eat at least 2 vegetarian meals each week.  Eat more home-cooked food and less restaurant, buffet, and fast food.  When eating at a restaurant, ask that your food be prepared with less salt or no  no salt, if possible. °What foods are recommended? °The items listed may not be a complete list. Talk with your dietitian about what dietary choices are best for you. °Grains °Whole-grain or whole-wheat bread. Whole-grain or whole-wheat pasta. Brown rice. Oatmeal. Quinoa. Bulgur. Whole-grain and low-sodium cereals. Pita bread. Low-fat, low-sodium crackers. Whole-wheat flour tortillas. °Vegetables °Fresh or frozen vegetables (raw, steamed, roasted, or grilled). Low-sodium or reduced-sodium tomato and vegetable juice. Low-sodium or reduced-sodium tomato sauce and tomato  paste. Low-sodium or reduced-sodium canned vegetables. °Fruits °All fresh, dried, or frozen fruit. Canned fruit in natural juice (without added sugar). °Meat and other protein foods °Skinless chicken or turkey. Ground chicken or turkey. Pork with fat trimmed off. Fish and seafood. Egg whites. Dried beans, peas, or lentils. Unsalted nuts, nut butters, and seeds. Unsalted canned beans. Lean cuts of beef with fat trimmed off. Low-sodium, lean deli meat. °Dairy °Low-fat (1%) or fat-free (skim) milk. Fat-free, low-fat, or reduced-fat cheeses. Nonfat, low-sodium ricotta or cottage cheese. Low-fat or nonfat yogurt. Low-fat, low-sodium cheese. °Fats and oils °Soft margarine without trans fats. Vegetable oil. Low-fat, reduced-fat, or light mayonnaise and salad dressings (reduced-sodium). Canola, safflower, olive, soybean, and sunflower oils. Avocado. °Seasoning and other foods °Herbs. Spices. Seasoning mixes without salt. Unsalted popcorn and pretzels. Fat-free sweets. °What foods are not recommended? °The items listed may not be a complete list. Talk with your dietitian about what dietary choices are best for you. °Grains °Baked goods made with fat, such as croissants, muffins, or some breads. Dry pasta or rice meal packs. °Vegetables °Creamed or fried vegetables. Vegetables in a cheese sauce. Regular canned vegetables (not low-sodium or reduced-sodium). Regular canned tomato sauce and paste (not low-sodium or reduced-sodium). Regular tomato and vegetable juice (not low-sodium or reduced-sodium). Pickles. Olives. °Fruits °Canned fruit in a light or heavy syrup. Fried fruit. Fruit in cream or butter sauce. °Meat and other protein foods °Fatty cuts of meat. Ribs. Fried meat. Bacon. Sausage. Bologna and other processed lunch meats. Salami. Fatback. Hotdogs. Bratwurst. Salted nuts and seeds. Canned beans with added salt. Canned or smoked fish. Whole eggs or egg yolks. Chicken or turkey with skin. °Dairy °Whole or 2% milk,  cream, and half-and-half. Whole or full-fat cream cheese. Whole-fat or sweetened yogurt. Full-fat cheese. Nondairy creamers. Whipped toppings. Processed cheese and cheese spreads. °Fats and oils °Butter. Stick margarine. Lard. Shortening. Ghee. Bacon fat. Tropical oils, such as coconut, palm kernel, or palm oil. °Seasoning and other foods °Salted popcorn and pretzels. Onion salt, garlic salt, seasoned salt, table salt, and sea salt. Worcestershire sauce. Tartar sauce. Barbecue sauce. Teriyaki sauce. Soy sauce, including reduced-sodium. Steak sauce. Canned and packaged gravies. Fish sauce. Oyster sauce. Cocktail sauce. Horseradish that you find on the shelf. Ketchup. Mustard. Meat flavorings and tenderizers. Bouillon cubes. Hot sauce and Tabasco sauce. Premade or packaged marinades. Premade or packaged taco seasonings. Relishes. Regular salad dressings. °Where to find more information: °· National Heart, Lung, and Blood Institute: www.nhlbi.nih.gov °· American Heart Association: www.heart.org °Summary °· The DASH eating plan is a healthy eating plan that has been shown to reduce high blood pressure (hypertension). It may also reduce your risk for type 2 diabetes, heart disease, and stroke. °· With the DASH eating plan, you should limit salt (sodium) intake to 2,300 mg a day. If you have hypertension, you may need to reduce your sodium intake to 1,500 mg a day. °· When on the DASH eating plan, aim to eat more fresh fruits and vegetables, whole grains, lean proteins, low-fat dairy, and   fats.  Work with your health care provider or diet and nutrition specialist (dietitian) to adjust your eating plan to your individual calorie needs. This information is not intended to replace advice given to you by your health care provider. Make sure you discuss any questions you have with your health care provider. Document Released: 03/08/2011 Document Revised: 03/12/2016 Document Reviewed: 03/12/2016 Elsevier  Interactive Patient Education  [employer].  Please be sure medication list is accurate. If a new problem present, please set up appointment sooner than planned today.

## 2017-08-22 NOTE — Telephone Encounter (Signed)
Called Tramadol to pharmacy, Ms Chea notified by VM per DPR detailed message left.  She was instructed to call to schedule the injection,if she wants it.

## 2017-08-22 NOTE — Telephone Encounter (Signed)
There was nothing in the injections that will elevate BPs on a long term basis THe injection gave immediate relief of pain from 7/10 to 1/10  And will need to be repeated,next mo in meantime may call in tramadol 50mg  BID#60 no RF

## 2017-08-22 NOTE — Progress Notes (Signed)
HPI:   Patricia [name] is a 47 y.o. female, who is here today for follow up.   She was last seen on 07/01/2017. Since her last visit she has follow with Dr. Letta Pate for chronic pain.  She is receiving Botox injections, first one 5/10 in right lower back.  She states that it helped with the pain on that area but now she is having pain on right upper back and still having pain on cervical paraspinal muscles and left shoulder. She has noted that since she started Botox injection her BP has been elevated.   187/103, she does not remember other readings. In the past she has had mildly elevated BP intermittently. She is not following any specific diet, she has noticed some weight gain. Maternal aunts with history of hypertension.  Currently she is on Meloxicam 15 mg daily. Denies severe/frequent headache, visual changes, exertional chest pain, dyspnea, palpitation, abdominal pain, gross hematuria, or decreased urine output.  Last eye exam about a month ago.   She has not been able to exercise due to chronic pain, it exacerbates hip and feet pain. She has not been consistent with a healthy diet.  She has also follow with psychiatrist at [facility], currently she is on Abilify 5 mg daily.  Recently diagnosed with bipolar disorder type I, medication has helped her "rest" at night, has decreased racing thoughts. She is following monthly with psychiatrist and psychotherapist.    Review of Systems  Constitutional: Negative for activity change, fatigue and fever.  HENT: Negative for mouth sores, nosebleeds and trouble swallowing.   Eyes: Negative for redness and visual disturbance.  Respiratory: Negative for cough, shortness of breath and wheezing.   Cardiovascular: Negative for chest pain, palpitations and leg swelling.  Gastrointestinal: Negative for abdominal pain, nausea and vomiting.       Negative for changes in bowel habits.  Genitourinary: Negative for decreased urine  volume and hematuria.  Musculoskeletal: Positive for arthralgias, back pain and neck pain. Negative for gait problem.  Skin: Negative for rash and wound.  Neurological: Negative for syncope, weakness and headaches.  Psychiatric/Behavioral: Negative for confusion and hallucinations. The patient is nervous/anxious.      Current Outpatient Medications on File Prior to Visit  Medication Sig Dispense Refill  . aspirin 81 MG chewable tablet Chew 81 mg by mouth daily as needed for mild pain.    . baclofen (LIORESAL) 10 MG tablet Take 1 tablet (10 mg total) by mouth 3 (three) times daily as needed for muscle spasms. 90 each 1  . gabapentin (NEURONTIN) 300 MG capsule Take 1 capsule (300 mg total) by mouth 3 (three) times daily. 90 capsule 1  . meloxicam (MOBIC) 15 MG tablet Take 1 tablet (15 mg total) by mouth daily. 30 tablet 1  . Multiple Vitamins-Minerals (MULTIVITAMIN WITH MINERALS) tablet Take 1 tablet by mouth daily.    . valACYclovir (VALTREX) 1000 MG tablet Take 1 tablet (1,000 mg total) by mouth 3 (three) times daily. 21 tablet 0  . traMADol (ULTRAM) 50 MG tablet Take 1 tablet (50 mg total) by mouth 2 (two) times daily. 60 tablet 0   No current facility-administered medications on file prior to visit.      Past Medical History:  Diagnosis Date  . Pregnancy induced hypertension   . Rheumatoid arthritis ([place])   . Seizure disorder ([place])    No Known Allergies  Social History   Socioeconomic History  . Marital status: Single  Spouse name: Not on file  . Number of children: Not on file  . Years of education: Not on file  . Highest education level: Not on file  Occupational History  . Not on file  Social Needs  . Financial resource strain: Not on file  . Food insecurity:    Worry: Not on file    Inability: Not on file  . Transportation needs:    Medical: Not on file    Non-medical: Not on file  Tobacco Use  . Smoking status: Current Some Day Smoker    Packs/day: 0.10     Years: 20.00    Pack years: 2.00    Start date: 08/31/2016  . Smokeless tobacco: Never Used  Substance and Sexual Activity  . Alcohol use: No    Comment: occasionaly  . Drug use: Not Currently    Types: Marijuana  . Sexual activity: Yes    Birth control/protection: None  Lifestyle  . Physical activity:    Days per week: Not on file    Minutes per session: Not on file  . Stress: Not on file  Relationships  . Social connections:    Talks on phone: Not on file    Gets together: Not on file    Attends religious service: Not on file    Active member of club or organization: Not on file    Attends meetings of clubs or organizations: Not on file    Relationship status: Not on file  Other Topics Concern  . Not on file  Social History Narrative  . Not on file    Vitals:   08/22/17 1449  BP: 130/90  Pulse: 80  Resp: 12  Temp: 98.2 F (36.8 C)  SpO2: 97%   Body mass index is 31.22 kg/m.  Wt Readings from Last 3 Encounters:  08/22/17 176 lb 4 oz (79.9 kg)  08/09/17 178 lb (80.7 kg)  07/15/17 175 lb 9.6 oz (79.7 kg)    Physical Exam  Nursing note and vitals reviewed. Constitutional: She is oriented to person, place, and time. She appears well-developed. No distress.  HENT:  Head: Normocephalic and atraumatic.  Mouth/Throat: Oropharynx is clear and moist and mucous membranes are normal. She has dentures.  Eyes: Pupils are equal, round, and reactive to light. Conjunctivae are normal.  Cardiovascular: Normal rate and regular rhythm.  No murmur heard. Pulses:      Dorsalis pedis pulses are 2+ on the right side, and 2+ on the left side.  Respiratory: Effort normal and breath sounds normal. No respiratory distress.  GI: Soft. She exhibits no mass. There is no hepatomegaly. There is no tenderness.  Musculoskeletal: She exhibits no edema.  Limitation of shoulder range of motion L>>R, movement elicits pain. Pain upon palpation of palpation muscles and left paraspinal cervical  muscles.  Lymphadenopathy:    She has no cervical adenopathy.  Neurological: She is alert and oriented to person, place, and time. She has normal strength. Coordination and gait normal.  Skin: Skin is warm. No rash noted. No erythema.  Psychiatric: She has a normal mood and affect.  Well groomed, good eye contact.      ASSESSMENT AND PLAN:   Patricia [name] was seen today for follow-up.   Hypertension, essential, benign After discussion of few pharmacologic options, she agrees with trying Amlodipine 5 mg daily. We discussed some side effects. Low-salt/DASH diet recommended. She will continue monitoring BP at home. We discussed some side effects of NSAIDs in general.  Instructed about warning signs. Follow-up in 2 months, before if needed.  Bipolar 1 disorder, depressed ([place]) Recently diagnosed. Great improvement with Abilify 5 mg daily. Encouraged to continue following with psychiatrist and psychotherapist monthly.    Chronic pain disorder Botox injections had helped some. For now she will continue Meloxicam 50 mg daily.  If BP is persistently elevated we will need to discontinue it. She will continue follow-up with Dr. Letta Pate.  Class 1 obesity with body mass index (BMI) of 31.0 to 31.9 in adult We discussed benefits of wt loss as well as adverse effects of obesity. Consistency with healthy diet and physical activity recommended. Because she has not exercised regularly for a while, recommend starting slow and increased time as tolerated.  She can start walking in place for 5 minutes at the time.      [name], MD  [facility]. [place] office.

## 2017-08-22 NOTE — Assessment & Plan Note (Signed)
Botox injections had helped some. For now she will continue Meloxicam 50 mg daily.  If BP is persistently elevated we will need to discontinue it. She will continue follow-up with Dr. Letta Pate.

## 2017-09-03 ENCOUNTER — Encounter: Payer: BLUE CROSS/BLUE SHIELD | Attending: Physical Medicine & Rehabilitation

## 2017-09-03 ENCOUNTER — Encounter: Payer: Self-pay | Admitting: Physical Medicine & Rehabilitation

## 2017-09-03 ENCOUNTER — Ambulatory Visit (HOSPITAL_BASED_OUTPATIENT_CLINIC_OR_DEPARTMENT_OTHER): Payer: No Typology Code available for payment source | Admitting: Physical Medicine & Rehabilitation

## 2017-09-03 VITALS — BP 130/87 | HR 103 | Resp 14 | Ht 63.0 in | Wt 173.0 lb

## 2017-09-03 DIAGNOSIS — Z5181 Encounter for therapeutic drug level monitoring: Secondary | ICD-10-CM | POA: Insufficient documentation

## 2017-09-03 DIAGNOSIS — F17210 Nicotine dependence, cigarettes, uncomplicated: Secondary | ICD-10-CM | POA: Diagnosis not present

## 2017-09-03 DIAGNOSIS — M545 Low back pain: Secondary | ICD-10-CM | POA: Insufficient documentation

## 2017-09-03 DIAGNOSIS — G8929 Other chronic pain: Secondary | ICD-10-CM | POA: Insufficient documentation

## 2017-09-03 DIAGNOSIS — M47816 Spondylosis without myelopathy or radiculopathy, lumbar region: Secondary | ICD-10-CM | POA: Diagnosis not present

## 2017-09-03 DIAGNOSIS — Z9889 Other specified postprocedural states: Secondary | ICD-10-CM | POA: Insufficient documentation

## 2017-09-03 DIAGNOSIS — M7502 Adhesive capsulitis of left shoulder: Secondary | ICD-10-CM | POA: Insufficient documentation

## 2017-09-03 NOTE — Progress Notes (Signed)
  PROCEDURE RECORD [facility]   Name: Patricia [name] DOB:[birth year] MRN: [id number]  Date:09/03/2017  Physician: Alysia Penna, MD    Nurse/CMA: Truman Hayward, CMA  Allergies: No Known Allergies  Consent Signed: Yes.    Is patient diabetic? No.  CBG today?   Pregnant: No. LMP: Patient's last menstrual period was 07/30/2016. (age 48-55)  Anticoagulants: no Anti-inflammatory: no Antibiotics: no  Procedure: right L3,4,5 medial branch block  Position: Prone Start Time:   2:41pm    End Time: 2:47pm  Fluoro Time: 39  RN/CMA Detrick Dani, CMA Lee, CMA    Time 2:20 pm 2:52pm    BP 130/87 156/107    Pulse 103 88    Respirations 14 14    O2 Sat 95 95    S/S 6 6    Pain Level 6/10 1/10     D/C home with public transportation, patient A & O X 3, D/C instructions reviewed, and sits independently.

## 2017-09-03 NOTE — Progress Notes (Signed)
Right lumbar L3, L4 medial branch blocks and L5 dorsal ramus injection under fluoroscopic guidance  Indication: Right Lumbar pain which is not relieved by medication management or other conservative care and interfering with self-care and mobility.  Informed consent was obtained after describing risks and benefits of the procedure with the patient, this includes bleeding, bruising, infection, paralysis and medication side effects. The patient wishes to proceed and has given written consent. The patient was placed in a prone position. The lumbar area was marked and prepped with Betadine. One ML of 1% lidocaine was injected into each of 3 areas into the skin and subcutaneous tissue. Then a 22-gauge 3.5 in spinal needle was inserted targeting the junction of the Right S1 superior articular process and sacral ala junction. Needle was advanced under fluoroscopic guidance. Bone contact was made.Isovue 200 was injected x0.5 mL demonstrating no intravascular uptake. Then a solution containing 2% MPF lidocaine was injected x0.5 mL. Then the Right L5 superior articular process in transverse process junction was targeted. Bone contact was made.Isovue 200 was injected x0.5 mL demonstrating no intravascular uptake. Then a solution containing 2% MPF lidocaine was injected x0.5 mL. Then the Right L4 superior articular process in transverse process junction was targeted. Bone contact was made. Isovue 200 was injected x0.5 mL demonstrating no intravascular uptake. Then a solution containing2% MPF lidocaine was injected x0.5 mL Patient tolerated procedure well. Post procedure instructions were given. Please refer to post procedure form. 

## 2017-09-03 NOTE — Progress Notes (Deleted)
  PROCEDURE RECORD [facility]   Name: Patricia [name] DOB:07-31-1970 MRN: [id number]  Date:09/03/2017  Physician: Alysia Penna, MD    Nurse/CMA: Wessling,CMA/Stassi Fadely,CMA  Allergies: No Known Allergies  Consent Signed: Yes.    Is patient diabetic? No.  CBG today? ***  Pregnant: {yes no:314532} LMP: Patient's last menstrual period was 07/30/2016. (age 48-55)  Anticoagulants: {Yes/No:19989} Anti-inflammatory: {Yes/No:19989} Antibiotics: {Yes/No:19989}  Procedure: ***  Position: {PRONE/SUPINE/SITTING/LATERAL:21022916} Start Time: ***  End Time: ***  Fluoro Time: ***  RN/CMA *** ***    Time *** ***    BP *** ***    Pulse *** ***    Respirations *** ***    O2 Sat *** ***    S/S *** ***    Pain Level *** ***     D/C home with ***, patient A & O X 3, D/C instructions reviewed, and sits independently.

## 2017-09-06 ENCOUNTER — Ambulatory Visit: Payer: BLUE CROSS/BLUE SHIELD

## 2017-09-06 ENCOUNTER — Ambulatory Visit: Payer: BLUE CROSS/BLUE SHIELD | Admitting: Physical Medicine & Rehabilitation

## 2017-09-30 ENCOUNTER — Encounter: Payer: Self-pay | Admitting: Physical Medicine & Rehabilitation

## 2017-09-30 ENCOUNTER — Other Ambulatory Visit: Payer: Self-pay

## 2017-09-30 ENCOUNTER — Ambulatory Visit (HOSPITAL_BASED_OUTPATIENT_CLINIC_OR_DEPARTMENT_OTHER): Payer: No Typology Code available for payment source | Admitting: Physical Medicine & Rehabilitation

## 2017-09-30 ENCOUNTER — Encounter: Payer: BLUE CROSS/BLUE SHIELD | Attending: Physical Medicine & Rehabilitation

## 2017-09-30 VITALS — BP 137/90 | HR 76 | Wt 177.0 lb

## 2017-09-30 DIAGNOSIS — M7502 Adhesive capsulitis of left shoulder: Secondary | ICD-10-CM | POA: Diagnosis not present

## 2017-09-30 DIAGNOSIS — F17210 Nicotine dependence, cigarettes, uncomplicated: Secondary | ICD-10-CM | POA: Diagnosis not present

## 2017-09-30 DIAGNOSIS — G8929 Other chronic pain: Secondary | ICD-10-CM | POA: Diagnosis present

## 2017-09-30 DIAGNOSIS — M47816 Spondylosis without myelopathy or radiculopathy, lumbar region: Secondary | ICD-10-CM | POA: Diagnosis not present

## 2017-09-30 DIAGNOSIS — M545 Low back pain: Secondary | ICD-10-CM | POA: Insufficient documentation

## 2017-09-30 DIAGNOSIS — Z5181 Encounter for therapeutic drug level monitoring: Secondary | ICD-10-CM | POA: Insufficient documentation

## 2017-09-30 DIAGNOSIS — Z9889 Other specified postprocedural states: Secondary | ICD-10-CM | POA: Diagnosis not present

## 2017-09-30 MED ORDER — TRAMADOL HCL 50 MG PO TABS: 50.0000 mg | ORAL_TABLET | Freq: Two times a day (BID) | ORAL | 0 refills | Status: DC

## 2017-09-30 NOTE — Patient Instructions (Signed)

## 2017-09-30 NOTE — Progress Notes (Signed)
  PROCEDURE RECORD [facility]   Name: Patricia [name] DOB:1971-01-05 MRN: [id number]  Date:09/30/2017  Physician: Alysia Penna, MD    Nurse/CMA: Betti Cruz  Allergies: No Known Allergies  Consent Signed: Yes.    Is patient diabetic? No.  CBG today?   Pregnant: No. LMP: Patient's last menstrual period was 07/30/2016. (age 48-55)  Anticoagulants: no Anti-inflammatory: no Antibiotics: no  Procedure: Radiofrequency Ablation Position: Prone Start Time: 4:03pm End Time:  Fluoro Time:  RN/CMA Lee,CMA Lee,CMA    Time 3:08pm     BP 137/90     Pulse 76     Respirations 14     O2 Sat 98     S/S 6     Pain Level 2/10      D/C home with riding the city bus, patient A & O X 3, D/C instructions reviewed, and sits independently.

## 2017-10-25 ENCOUNTER — Ambulatory Visit: Payer: BLUE CROSS/BLUE SHIELD | Admitting: Family Medicine

## 2017-10-27 NOTE — Progress Notes (Deleted)
Ms. Patricia [name] is a 47 y.o.female, who is here today to follow on HTN.  She was seen on 08/22/17,when BP was elevated,she was started on Amlodipine 5 mg daily. Home BP readings: *** Last eye exam: ***  ***taking medications as instructed, no side effects reported.  ***has not noted unusual headache, visual changes, exertional chest pain, dyspnea,  focal weakness, or edema.    Lab Results  Component Value Date   CREATININE 0.70 04/08/2016   BUN 6 04/08/2016   NA 141 04/08/2016   K 3.8 04/08/2016   CL 104 04/08/2016   CO2 26 05/05/2013     Review of Systems   Current Outpatient Medications on File Prior to Visit  Medication Sig Dispense Refill  . amLODipine (NORVASC) 5 MG tablet Take 1 tablet (5 mg total) by mouth daily. 90 tablet 1  . aspirin 81 MG chewable tablet Chew 81 mg by mouth daily as needed for mild pain.    . baclofen (LIORESAL) 10 MG tablet Take 1 tablet (10 mg total) by mouth 3 (three) times daily as needed for muscle spasms. 90 each 1  . gabapentin (NEURONTIN) 300 MG capsule Take 1 capsule (300 mg total) by mouth 3 (three) times daily. 90 capsule 1  . meloxicam (MOBIC) 15 MG tablet Take 1 tablet (15 mg total) by mouth daily. 30 tablet 1  . Multiple Vitamins-Minerals (MULTIVITAMIN WITH MINERALS) tablet Take 1 tablet by mouth daily.    . traMADol (ULTRAM) 50 MG tablet Take 1 tablet (50 mg total) by mouth 2 (two) times daily. 60 tablet 0  . valACYclovir (VALTREX) 1000 MG tablet Take 1 tablet (1,000 mg total) by mouth 3 (three) times daily. 21 tablet 0   No current facility-administered medications on file prior to visit.      Past Medical History:  Diagnosis Date  . Pregnancy induced hypertension   . Rheumatoid arthritis ([place])   . Seizure disorder ([place])     No Known Allergies  Social History   Socioeconomic History  . Marital status: Single    Spouse name: Not on file  . Number of children: Not on file  . Years of education: Not on file  .  Highest education level: Not on file  Occupational History  . Not on file  Social Needs  . Financial resource strain: Not on file  . Food insecurity:    Worry: Not on file    Inability: Not on file  . Transportation needs:    Medical: Not on file    Non-medical: Not on file  Tobacco Use  . Smoking status: Current Some Day Smoker    Packs/day: 0.10    Years: 20.00    Pack years: 2.00    Start date: 08/31/2016  . Smokeless tobacco: Never Used  Substance and Sexual Activity  . Alcohol use: No    Comment: occasionaly  . Drug use: Not Currently    Types: Marijuana  . Sexual activity: Yes    Birth control/protection: None  Lifestyle  . Physical activity:    Days per week: Not on file    Minutes per session: Not on file  . Stress: Not on file  Relationships  . Social connections:    Talks on phone: Not on file    Gets together: Not on file    Attends religious service: Not on file    Active member of club or organization: Not on file    Attends meetings of clubs or  organizations: Not on file    Relationship status: Not on file  Other Topics Concern  . Not on file  Social History Narrative  . Not on file    There were no vitals filed for this visit. There is no height or weight on file to calculate BMI.    Physical Exam  ASSESSMENT AND PLAN:   There are no diagnoses linked to this encounter.    -Patricia [name] advised to return sooner than planned today if new concerns arise.     Diyana Starrett G. Martinique, MD  [facility]. [place] office.

## 2017-10-28 ENCOUNTER — Ambulatory Visit: Payer: BLUE CROSS/BLUE SHIELD | Admitting: Family Medicine

## 2017-11-03 NOTE — Progress Notes (Signed)
Patricia [name] is a 47 y.o.female, who is here today to follow on HTN.  She was seen on 08/22/17,when BP was elevated,she was started on Amlodipine 5 mg daily. Home BP readings: She does not have a BP monitor,sometimes she checks it at [facility] and elevated , 160's/90's. Last eye exam: 1-2 months ago.  She is taking medications as instructed, no side effects reported.  She has not noted unusual headache, visual changes, exertional chest pain, dyspnea,  focal weakness, or edema.    Lab Results  Component Value Date   CREATININE 0.70 04/08/2016   BUN 6 04/08/2016   NA 141 04/08/2016   K 3.8 04/08/2016   CL 104 04/08/2016   CO2 26 05/05/2013   She is still smoking,trying to cut back. She has not tried pharmacologic treatment.   Chronic pain: Following with ortho prn. She is seeing Dr Letta Pate, re[orting improvement of lower back pain with "injection." She had radiofrequency ablation. Still c/o upper back pain and left shoulder. Completed PT,which did not help with left shoulder ROM.  Also taking Gabapentin 300 mg tid.  Pain is severe,exacerbated by movement and alleviated by rest.  Currently she is on Tramadol and Zanaflex.   She is also c/o persistent left heel pain. It is worse when she first gets up in the morning and with prolonged walking. No erythema or edema. She has had pain for at least 6 months. Getting worse. She tried rolling heel on a ball and stretching exercises.     Review of Systems  Constitutional: Negative for activity change, appetite change, fatigue and fever.  HENT: Negative for mouth sores, nosebleeds and trouble swallowing.   Eyes: Negative for redness and visual disturbance.  Respiratory: Negative for cough, shortness of breath and wheezing.   Cardiovascular: Negative for chest pain, palpitations and leg swelling.  Gastrointestinal: Negative for abdominal pain, nausea and vomiting.       Negative for changes in bowel habits.    Genitourinary: Negative for decreased urine volume and hematuria.  Musculoskeletal: Positive for arthralgias and neck pain. Negative for gait problem.  Neurological: Negative for syncope, weakness and headaches.     Current Outpatient Medications on File Prior to Visit  Medication Sig Dispense Refill  . amLODipine (NORVASC) 5 MG tablet Take 1 tablet (5 mg total) by mouth daily. 90 tablet 1  . aspirin 81 MG chewable tablet Chew 81 mg by mouth daily as needed for mild pain.    . baclofen (LIORESAL) 10 MG tablet Take 1 tablet (10 mg total) by mouth 3 (three) times daily as needed for muscle spasms. 90 each 1  . gabapentin (NEURONTIN) 300 MG capsule Take 1 capsule (300 mg total) by mouth 3 (three) times daily. 90 capsule 1  . Multiple Vitamins-Minerals (MULTIVITAMIN WITH MINERALS) tablet Take 1 tablet by mouth daily.    Marland Kitchen tiZANidine (ZANAFLEX) 4 MG tablet TAKE 1 TABLET BY MOUTH EVERY 8 HOURS IF NEEDED FOR MUSCLE SPASM  0  . traMADol (ULTRAM) 50 MG tablet Take 1 tablet (50 mg total) by mouth 2 (two) times daily. 60 tablet 0  . valACYclovir (VALTREX) 1000 MG tablet Take 1 tablet (1,000 mg total) by mouth 3 (three) times daily. (Patient taking differently: Take 1,000 mg by mouth 3 (three) times daily. ) 21 tablet 0   No current facility-administered medications on file prior to visit.      Past Medical History:  Diagnosis Date  . Pregnancy induced hypertension   . Rheumatoid  arthritis ([place])   . Seizure disorder ([place])     No Known Allergies  Social History   Socioeconomic History  . Marital status: Single    Spouse name: Not on file  . Number of children: Not on file  . Years of education: Not on file  . Highest education level: Not on file  Occupational History  . Not on file  Social Needs  . Financial resource strain: Not on file  . Food insecurity:    Worry: Not on file    Inability: Not on file  . Transportation needs:    Medical: Not on file    Non-medical: Not on file   Tobacco Use  . Smoking status: Current Some Day Smoker    Packs/day: 0.10    Years: 20.00    Pack years: 2.00    Start date: 08/31/2016  . Smokeless tobacco: Never Used  Substance and Sexual Activity  . Alcohol use: No    Comment: occasionaly  . Drug use: Not Currently    Types: Marijuana  . Sexual activity: Yes    Birth control/protection: None  Lifestyle  . Physical activity:    Days per week: Not on file    Minutes per session: Not on file  . Stress: Not on file  Relationships  . Social connections:    Talks on phone: Not on file    Gets together: Not on file    Attends religious service: Not on file    Active member of club or organization: Not on file    Attends meetings of clubs or organizations: Not on file    Relationship status: Not on file  Other Topics Concern  . Not on file  Social History Narrative  . Not on file    Vitals:   11/04/17 1046  BP: 130/86  Pulse: 66  Resp: 12  Temp: 97.9 F (36.6 C)  SpO2: 98%   Body mass index is 31.09 kg/m.   Physical Exam  Nursing note and vitals reviewed. Constitutional: She is oriented to person, place, and time. She appears well-developed. No distress.  HENT:  Head: Normocephalic and atraumatic.  Mouth/Throat: Oropharynx is clear and moist and mucous membranes are normal.  Eyes: Pupils are equal, round, and reactive to light. Conjunctivae are normal.  Cardiovascular: Normal rate and regular rhythm.  No murmur heard. Pulses:      Dorsalis pedis pulses are 2+ on the right side, and 2+ on the left side.  Respiratory: Effort normal and breath sounds normal. No respiratory distress.  GI: Soft. She exhibits no mass. There is no hepatomegaly. There is no tenderness.  Musculoskeletal: She exhibits no edema.       Left shoulder: She exhibits decreased range of motion and tenderness. She exhibits no swelling.       Cervical back: She exhibits decreased range of motion and tenderness.  Left foot:Tenderness upon  palpation of heel mainly at the medial insertion of plantar fascia into calcaneous. Also mild discomfort with palpation along planta fascia towards forefoot.   Dorsal flexion of first MTP elicits pain.  No edema or erythema appreciated on area.   Lymphadenopathy:    She has no cervical adenopathy.  Neurological: She is alert and oriented to person, place, and time. She has normal strength. No cranial nerve deficit. Gait normal.  Skin: Skin is warm. No rash noted. No erythema.  Psychiatric: She has a normal mood and affect.  Well groomed, good eye contact.    ASSESSMENT  AND PLAN:   Ms. Ammanda was seen today for follow-up.  Diagnoses and all orders for this visit:  Hypertension, essential, benign Re-checked 153/95. Not well controlled.  Possible complications of elevated BP discussed. HCTZ 25 mg daily added today. She will continue amlodipine 5 mg daily. Recommend monitoring BP at home, prescription for blood pressure monitor given in case her insurance pays for one. BMP in 3 to 4 weeks. Annual eye examination. F/U in 3-4 months.   Tobacco use disorder  After discussion of options for smoking cessation she would like to try Nicotine patch. Smoking 3-4 cig per day. Some side effects of Nicotine discussed.  -     nicotine (NICODERM CQ - DOSED IN MG/24 HOURS) 14 mg/24hr patch; Place 1 patch (14 mg total) onto the skin daily.  Chronic left shoulder pain  Adhesive capsulitis with poor response with PT.  Continue ROM exercises and follow with Dr Windell Hummingbird. Also continue following with ortho prn.   Plantar fasciitis of left foot  Did not improve with conservative treatment,so recommended podiatrist evaluation.  Information about providers options in the area given.     [name], MD  [facility]. [place] office.

## 2017-11-04 ENCOUNTER — Ambulatory Visit (INDEPENDENT_AMBULATORY_CARE_PROVIDER_SITE_OTHER): Payer: BLUE CROSS/BLUE SHIELD | Admitting: Family Medicine

## 2017-11-04 ENCOUNTER — Encounter: Payer: Self-pay | Admitting: Family Medicine

## 2017-11-04 VITALS — BP 130/86 | HR 66 | Temp 97.9°F | Resp 12 | Ht 63.0 in | Wt 175.5 lb

## 2017-11-04 DIAGNOSIS — M25512 Pain in left shoulder: Secondary | ICD-10-CM | POA: Diagnosis not present

## 2017-11-04 DIAGNOSIS — I10 Essential (primary) hypertension: Secondary | ICD-10-CM | POA: Diagnosis not present

## 2017-11-04 DIAGNOSIS — G8929 Other chronic pain: Secondary | ICD-10-CM

## 2017-11-04 DIAGNOSIS — F17200 Nicotine dependence, unspecified, uncomplicated: Secondary | ICD-10-CM | POA: Diagnosis not present

## 2017-11-04 DIAGNOSIS — M722 Plantar fascial fibromatosis: Secondary | ICD-10-CM | POA: Diagnosis not present

## 2017-11-04 MED ORDER — HYDROCHLOROTHIAZIDE 25 MG PO TABS: 25.0000 mg | ORAL_TABLET | Freq: Every day | ORAL | 1 refills | Status: DC

## 2017-11-04 MED ORDER — NICOTINE 14 MG/24HR TD PT24: 14.0000 mg | MEDICATED_PATCH | Freq: Every day | TRANSDERMAL | 1 refills | Status: DC

## 2017-11-04 NOTE — Patient Instructions (Addendum)
A few things to remember from today's visit:   Hypertension, essential, benign - Plan: hydrochlorothiazide (HYDRODIURIL) 25 MG tablet  Tobacco use disorder - Plan: nicotine (NICODERM CQ - DOSED IN MG/24 HOURS) 14 mg/24hr patch  Chronic left shoulder pain  Hydrochlorothiazide 25 mg added, you can take it in the morning. He will continue amlodipine 5 mg, which you can take at bedtime. Continue range of motion exercises of left shoulder. Continue following with pain management.  We need to schedule potassium in 3 to 4 weeks.  Please schedule appointment with podiatrist in the area, I do not believe you need a referral.  Continue monitoring your blood pressure at home.   Please be sure medication list is accurate. If a new problem present, please set up appointment sooner than planned today.

## 2017-11-04 NOTE — Assessment & Plan Note (Addendum)
Re-checked 153/95. Not well controlled.  Possible complications of elevated BP discussed. HCTZ 25 mg daily added today. She will continue amlodipine 5 mg daily. Recommend monitoring BP at home, prescription for blood pressure monitor given in case her insurance pays for one. BMP in 3 to 4 weeks. Annual eye examination. F/U in 3-4 months.

## 2017-11-25 ENCOUNTER — Other Ambulatory Visit: Payer: BLUE CROSS/BLUE SHIELD

## 2017-12-09 ENCOUNTER — Ambulatory Visit: Payer: BLUE CROSS/BLUE SHIELD | Admitting: Podiatry

## 2017-12-13 ENCOUNTER — Encounter: Payer: Self-pay | Admitting: Podiatry

## 2017-12-13 ENCOUNTER — Ambulatory Visit (INDEPENDENT_AMBULATORY_CARE_PROVIDER_SITE_OTHER): Payer: BLUE CROSS/BLUE SHIELD

## 2017-12-13 ENCOUNTER — Ambulatory Visit (INDEPENDENT_AMBULATORY_CARE_PROVIDER_SITE_OTHER): Payer: BLUE CROSS/BLUE SHIELD | Admitting: Podiatry

## 2017-12-13 VITALS — BP 132/99 | HR 77

## 2017-12-13 DIAGNOSIS — M722 Plantar fascial fibromatosis: Secondary | ICD-10-CM

## 2017-12-13 MED ORDER — TRIAMCINOLONE ACETONIDE 10 MG/ML IJ SUSP
10.0000 mg | Freq: Once | INTRAMUSCULAR | Status: AC
  Administered 2017-12-13: 10 mg

## 2017-12-13 NOTE — Patient Instructions (Signed)
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
## 2017-12-27 ENCOUNTER — Ambulatory Visit: Payer: BLUE CROSS/BLUE SHIELD | Admitting: Podiatry

## 2017-12-31 ENCOUNTER — Ambulatory Visit: Payer: No Typology Code available for payment source | Admitting: Physical Medicine & Rehabilitation

## 2017-12-31 ENCOUNTER — Encounter: Payer: BLUE CROSS/BLUE SHIELD | Attending: Physical Medicine & Rehabilitation

## 2017-12-31 DIAGNOSIS — G8929 Other chronic pain: Secondary | ICD-10-CM | POA: Insufficient documentation

## 2017-12-31 DIAGNOSIS — Z5181 Encounter for therapeutic drug level monitoring: Secondary | ICD-10-CM | POA: Insufficient documentation

## 2017-12-31 DIAGNOSIS — Z9889 Other specified postprocedural states: Secondary | ICD-10-CM | POA: Insufficient documentation

## 2017-12-31 DIAGNOSIS — M545 Low back pain: Secondary | ICD-10-CM | POA: Insufficient documentation

## 2017-12-31 DIAGNOSIS — M47816 Spondylosis without myelopathy or radiculopathy, lumbar region: Secondary | ICD-10-CM | POA: Insufficient documentation

## 2017-12-31 DIAGNOSIS — F17210 Nicotine dependence, cigarettes, uncomplicated: Secondary | ICD-10-CM | POA: Insufficient documentation

## 2017-12-31 DIAGNOSIS — M7502 Adhesive capsulitis of left shoulder: Secondary | ICD-10-CM | POA: Insufficient documentation

## 2018-01-01 ENCOUNTER — Telehealth: Payer: Self-pay | Admitting: Physical Medicine & Rehabilitation

## 2018-01-01 NOTE — Telephone Encounter (Signed)
Pts insurance([employer]) expired 12/20/17, that's why she didn't come to her 10/1 appt.  She has applied for MCD & waiting to hear. Wants med refill & will be selfpay  Thanks, Vilinda Blanks.

## 2018-01-02 MED ORDER — TRAMADOL HCL 50 MG PO TABS: 50.0000 mg | ORAL_TABLET | Freq: Two times a day (BID) | ORAL | 2 refills | Status: DC

## 2018-01-02 NOTE — Telephone Encounter (Addendum)
Patient is asking for refill on her medications.  I presume this for Tramadol.  Gabapentin and Baclofen last prescribed by Dr. Martinique.  Her insurance has run out [employer] 12/20/2017).  She has applied for medicaid.  She would have to self pay until medicaid is approved.  Her last visit was RF injection 09/30/2017 with 3 month follow up indicated by Dr. Letta Pate instruction.  Technically, tramadol requires office visit every 6 months. Please advise on next step

## 2018-01-02 NOTE — Addendum Note (Signed)
Addended by: Charlett Blake on: 01/02/2018 01:59 PM   Modules accepted: Orders

## 2018-01-07 ENCOUNTER — Ambulatory Visit: Payer: Self-pay | Admitting: *Deleted

## 2018-01-07 ENCOUNTER — Telehealth: Payer: Self-pay | Admitting: Family Medicine

## 2018-01-07 NOTE — Telephone Encounter (Signed)
See nurse triage note dated 01/07/18.

## 2018-01-07 NOTE — Telephone Encounter (Signed)
Copied from [place] [id number]. Topic: Quick Communication - Rx Refill/Question >> Jan 07, 2018  1:18 PM Waylan Rocher, Lumin L wrote: Medication: DULoxetine (CYMBALTA) 30 MG capsule (for anxiety)  Has the patient contacted their pharmacy? No. Ne request (Agent: If no, request that the patient contact the pharmacy for the refill.) (Agent: If yes, when and what did the pharmacy advise?)  Preferred Pharmacy (with phone number or street name): [facility] [phone] - [place], [address] [place] [address] Phone: [phone] Fax: [phone]  Agent: Please be advised that RX refills may take up to 3 business days. We ask that you follow-up with your pharmacy.

## 2018-01-07 NOTE — Telephone Encounter (Signed)
Contacted pt regarding her request for cymbalta; she says that she has anxiety especially when she goes out; the pt reports that this has been going on since last year; she also says that she has been going to mental health; recommendations made per nurse triage protocol; pt offered and accepted appointment with Dr Betty Martinique, [place], 01/08/18 at 0930; she verbalizes understanding; will route to office for notification of this upcoming appointment   Reason for Disposition . Symptoms interfere with work or school  Answer Assessment - Initial Assessment Questions 1. CONCERN: "What happened that made you call today?"     "I need something for anxiety" 2. ANXIETY SYMPTOM SCREENING: "Can you describe how you have been feeling?"  (e.g., tense, restless, panicky, anxious, keyed up, trouble sleeping, trouble concentrating)     Heart beating fast, [place] 3. ONSET: "How long have you been feeling this way?"     Since last year 4. RECURRENT: "Have you felt this way before?"  If yes: "What happened that time?" "What helped these feelings go away in the past?"      yes 5. RISK OF HARM - SUICIDAL IDEATION:  "Do you ever have thoughts of hurting or killing yourself?"  (e.g., yes, no, no but preoccupation with thoughts about death)   - INTENT:  "Do you have thoughts of hurting or killing yourself right NOW?" (e.g., yes, no, N/A)   - PLAN: "Do you have a specific plan for how you would do this?" (e.g., gun, knife, overdose, no plan, N/A)     no 6. RISK OF HARM - HOMICIDAL IDEATION:  "Do you ever have thoughts of hurting or killing someone else?"  (e.g., yes, no, no but preoccupation with thoughts about death)   - INTENT:  "Do you have thoughts of hurting or killing someone right NOW?" (e.g., yes, no, N/A)   - PLAN: "Do you have a specific plan for how you would do this?" (e.g., gun, knife, no plan, N/A)    no   7. FUNCTIONAL IMPAIRMENT: "How have things been going for you overall in your life? Have you  had any more difficulties than usual doing your normal daily activities?"  (e.g., better, same, worse; self-care, school, work, interactions)     More stress in her life 8. SUPPORT: "Who is with you now?" "Who do you live with?" "Do you have family or friends nearby who you can talk to?"      family 60. THERAPIST: "Do you have a counselor or therapist? Name?"    Therapist at mental health 10. STRESSORS: "Has there been any new stress or recent changes in your life?"       yes 11. CAFFEINE ABUSE: "Do you drink caffeinated beverages, and how much each day?" (e.g., coffee, tea, colas)       yes 12. SUBSTANCE ABUSE: "Do you use any illegal drugs or alcohol?"       Social drinker 13. OTHER SYMPTOMS: "Do you have any other physical symptoms right now?" (e.g., chest pain, palpitations, difficulty breathing, fever)       no 14. PREGNANCY: "Is there any chance you are pregnant?" "When was your last menstrual period?"       No  Protocols used: ANXIETY AND PANIC ATTACK-A-AH

## 2018-01-07 NOTE — Telephone Encounter (Signed)
Dr. Martinique - Juluis Rainier, thanks!

## 2018-01-08 ENCOUNTER — Ambulatory Visit: Payer: Self-pay | Admitting: Family Medicine

## 2018-01-08 DIAGNOSIS — Z0289 Encounter for other administrative examinations: Secondary | ICD-10-CM

## 2018-01-10 ENCOUNTER — Ambulatory Visit (INDEPENDENT_AMBULATORY_CARE_PROVIDER_SITE_OTHER): Payer: No Typology Code available for payment source | Admitting: Podiatry

## 2018-01-10 ENCOUNTER — Encounter: Payer: Self-pay | Admitting: Podiatry

## 2018-01-10 DIAGNOSIS — M722 Plantar fascial fibromatosis: Secondary | ICD-10-CM

## 2018-01-10 MED ORDER — TRIAMCINOLONE ACETONIDE 10 MG/ML IJ SUSP
10.0000 mg | Freq: Once | INTRAMUSCULAR | Status: AC
  Administered 2018-01-10: 10 mg

## 2018-01-10 NOTE — Progress Notes (Signed)
Subjective:   Patient ID: Patricia [name], female   DOB: 47 y.o.   MRN: [id number]   HPI Patient states improved but still have an area that is quite tender on the bottom of the left heel   ROS      Objective:  Physical Exam  Neurovascular status intact with significant diminishment of discomfort plantar aspect left one area that is still very tender     Assessment:  Acute plantar fasciitis left still present     Plan:  Reinjected the plantar fascial left 3 mg Kenalog 5 mg Xylocaine advised on continued boot usage with gradual diminishment and reappoint in the next 4 to 6 weeks if symptoms persist

## 2018-01-21 ENCOUNTER — Emergency Department (HOSPITAL_COMMUNITY): Payer: Medicaid Other

## 2018-01-21 ENCOUNTER — Other Ambulatory Visit: Payer: Self-pay

## 2018-01-21 ENCOUNTER — Emergency Department (HOSPITAL_COMMUNITY)
Admission: EM | Admit: 2018-01-21 | Discharge: 2018-01-21 | Disposition: A | Discharge status: Home / Self Care | Payer: Medicaid Other | Attending: Emergency Medicine | Admitting: Emergency Medicine

## 2018-01-21 ENCOUNTER — Encounter (HOSPITAL_COMMUNITY): Payer: Self-pay | Admitting: Emergency Medicine

## 2018-01-21 DIAGNOSIS — F17200 Nicotine dependence, unspecified, uncomplicated: Secondary | ICD-10-CM | POA: Diagnosis not present

## 2018-01-21 DIAGNOSIS — Y9301 Activity, walking, marching and hiking: Secondary | ICD-10-CM | POA: Diagnosis not present

## 2018-01-21 DIAGNOSIS — W230XXA Caught, crushed, jammed, or pinched between moving objects, initial encounter: Secondary | ICD-10-CM | POA: Diagnosis not present

## 2018-01-21 DIAGNOSIS — S8001XA Contusion of right knee, initial encounter: Secondary | ICD-10-CM | POA: Insufficient documentation

## 2018-01-21 DIAGNOSIS — Z79899 Other long term (current) drug therapy: Secondary | ICD-10-CM | POA: Insufficient documentation

## 2018-01-21 DIAGNOSIS — Y929 Unspecified place or not applicable: Secondary | ICD-10-CM | POA: Insufficient documentation

## 2018-01-21 DIAGNOSIS — I10 Essential (primary) hypertension: Secondary | ICD-10-CM | POA: Diagnosis not present

## 2018-01-21 DIAGNOSIS — S8991XA Unspecified injury of right lower leg, initial encounter: Secondary | ICD-10-CM | POA: Diagnosis present

## 2018-01-21 DIAGNOSIS — Y999 Unspecified external cause status: Secondary | ICD-10-CM | POA: Insufficient documentation

## 2018-01-21 HISTORY — DX: Depression, unspecified: F32.A

## 2018-01-21 HISTORY — DX: Major depressive disorder, single episode, unspecified: F32.9

## 2018-01-21 MED ORDER — NAPROXEN 500 MG PO TABS: 500.0000 mg | ORAL_TABLET | Freq: Two times a day (BID) | ORAL | 0 refills | Status: DC

## 2018-01-21 NOTE — ED Provider Notes (Signed)
[facility] EMERGENCY DEPARTMENT Provider Note   CSN: [id number] Arrival date & time: 01/21/18  6295     History   Chief Complaint Chief Complaint  Patient presents with  . Knee Pain    HPI Patricia [name] is a 47 y.o. female with past medical history of rheumatoid arthritis who presents to ED for 1 day history of right knee pain.  States the pain is a burning sensation, worse with movement and palpation.  Pain began yesterday.  She was getting off the bus when the driver accidentally closed the bus doors on her leg.  She is been ambulatory since then.  She took tramadol which helped with her symptoms.  Denies any prior fracture, dislocations or procedures in the area.  Denies any numbness in arms or legs, history of gout.  HPI  Past Medical History:  Diagnosis Date  . Depression   . Pregnancy induced hypertension   . Rheumatoid arthritis ([place])   . Seizure disorder [facility])     Patient Active Problem List   Diagnosis Date Noted  . Tobacco use disorder 11/04/2017  . Hypertension, essential, benign 08/22/2017  . Class 1 obesity with body mass index (BMI) of 31.0 to 31.9 in adult 08/22/2017  . Spondylosis without myelopathy or radiculopathy, lumbar region 07/15/2017  . Chronic pain disorder 04/29/2017  . Lower back pain 10/15/2016  . Cervicalgia 08/06/2016  . Bipolar 1 disorder, depressed ([place]) 08/06/2016  . Adhesive capsulitis of left shoulder 05/31/2016    Past Surgical History:  Procedure Laterality Date  . BICEPS TENDON REPAIR    . CESAREAN SECTION    . SHOULDER SURGERY    . WISDOM TOOTH EXTRACTION       OB History    Gravida  4   Para  4   Term  3   Preterm  1   AB      Living  4     SAB      TAB      Ectopic      Multiple      Live Births               Home Medications    Prior to Admission medications   Medication Sig Start Date End Date Taking? Authorizing Provider  amLODipine (NORVASC) 5 MG tablet Take 1  tablet (5 mg total) by mouth daily. 08/22/17   Martinique, Betty G, MD  aspirin 81 MG chewable tablet Chew 81 mg by mouth daily as needed for mild pain.    [provider]  baclofen (LIORESAL) 10 MG tablet Take 1 tablet (10 mg total) by mouth 3 (three) times daily as needed for muscle spasms. 04/29/17   Martinique, Betty G, MD  gabapentin (NEURONTIN) 300 MG capsule Take 1 capsule (300 mg total) by mouth 3 (three) times daily. 07/01/17   Martinique, Betty G, MD  hydrochlorothiazide (HYDRODIURIL) 25 MG tablet Take 1 tablet (25 mg total) by mouth daily. 11/04/17   Martinique, Betty G, MD  Multiple Vitamins-Minerals (MULTIVITAMIN WITH MINERALS) tablet Take 1 tablet by mouth daily.    [provider]  naproxen (NAPROSYN) 500 MG tablet Take 1 tablet (500 mg total) by mouth 2 (two) times daily. 01/21/18   Jonavon Trieu, PA-C  nicotine (NICODERM CQ - DOSED IN MG/24 HOURS) 14 mg/24hr patch Place 1 patch (14 mg total) onto the skin daily. 11/04/17   Martinique, Betty G, MD  tiZANidine (ZANAFLEX) 4 MG tablet TAKE 1 TABLET BY MOUTH  EVERY 8 HOURS IF NEEDED FOR MUSCLE SPASM 10/18/17   [provider]  traMADol (ULTRAM) 50 MG tablet Take 1 tablet (50 mg total) by mouth 2 (two) times daily. 01/02/18   Kirsteins, Luanna Salk, MD  valACYclovir (VALTREX) 1000 MG tablet Take 1 tablet (1,000 mg total) by mouth 3 (three) times daily. Patient taking differently: Take 1,000 mg by mouth 3 (three) times daily.  04/30/15   Billy Fischer, MD    Family History Family History  Problem Relation Age of Onset  . Anesthesia problems Neg Hx   . Alcohol abuse Neg Hx   . Arthritis Neg Hx   . Asthma Neg Hx   . Birth defects Neg Hx   . Cancer Neg Hx   . COPD Neg Hx   . Depression Neg Hx   . Diabetes Neg Hx   . Drug abuse Neg Hx   . Early death Neg Hx   . Hearing loss Neg Hx   . Heart disease Neg Hx   . Hyperlipidemia Neg Hx   . Hypertension Neg Hx   . Kidney disease Neg Hx   . Learning disabilities Neg Hx   . Mental illness Neg  Hx   . Mental retardation Neg Hx   . Miscarriages / Stillbirths Neg Hx   . Stroke Neg Hx   . Vision loss Neg Hx     Social History Social History   Tobacco Use  . Smoking status: Current Some Day Smoker    Packs/day: 0.10    Years: 20.00    Pack years: 2.00    Start date: 08/31/2016  . Smokeless tobacco: Never Used  Substance Use Topics  . Alcohol use: No    Comment: occasionaly  . Drug use: Not Currently    Types: Marijuana     Allergies   Patient has no known allergies.   Review of Systems Review of Systems  Constitutional: Negative for chills and fever.  Musculoskeletal: Positive for arthralgias.  Skin: Negative for wound.  Neurological: Negative for weakness and numbness.     Physical Exam Updated Vital Signs BP 116/82 (BP Location: Right Arm)   Pulse 68   Temp 99 F (37.2 C) (Oral)   Resp 16   LMP 07/30/2016   SpO2 100%   Physical Exam  Constitutional: She appears well-developed and well-nourished. No distress.  HENT:  Head: Normocephalic and atraumatic.  Eyes: Conjunctivae and EOM are normal. No scleral icterus.  Neck: Normal range of motion.  Pulmonary/Chest: Effort normal. No respiratory distress.  Musculoskeletal: Normal range of motion. She exhibits tenderness. She exhibits no edema or deformity.       Legs: Tenderness to palpation of right lateral knee.  No overlying skin changes or deformities noted.  Area is neurovascularly intact with normal sensation and 2+ DP pulse.  Full active and passive range of motion.  Able to perform straight leg raise.  He  Neurological: She is alert.  Skin: No rash noted. She is not diaphoretic.  Psychiatric: She has a normal mood and affect.  Nursing note and vitals reviewed.    ED Treatments / Results  Labs (all labs ordered are listed, but only abnormal results are displayed) Labs Reviewed - No data to display  EKG None  Radiology Dg Knee Complete 4 Views Right  Result Date: 01/21/2018 CLINICAL  DATA:  Caught knee in door yesterday, pain and stiffness EXAM: RIGHT KNEE - COMPLETE 4+ VIEW COMPARISON:  None FINDINGS: Osseous mineralization low normal. Joint  spaces preserved. No acute fracture, dislocation, or bone destruction. No knee joint effusion. IMPRESSION: No acute osseous abnormalities. Electronically Signed   By: Lavonia Dana M.D.   On: 01/21/2018 10:17    Procedures Procedures (including critical care time)  Medications Ordered in ED Medications - No data to display   Initial Impression / Assessment and Plan / ED Course  I have reviewed the triage vital signs and the nursing notes.  Pertinent labs & imaging results that were available during my care of the patient were reviewed by me and considered in my medical decision making (see chart for details).     47 year old female presents for right knee pain since yesterday.  The bus doors accidentally slammed closed on her leg yesterday.  She has been ambulating since then.  On exam there are no overlying skin changes to joint noted.  Tenderness palpation of the lateral right knee.  Area is neurovascularly intact.  No calf tenderness.  X-rays negative.  Suspect that symptoms are due to a contusion.  Doubt infectious or vascular cause.  Will treat with anti-inflammatories, rice therapy and orthopedic follow-up as needed.  Advised to return to ED for any severe worsening symptoms.  Portions of this note were generated with [employer]. Dictation errors may occur despite best attempts at proofreading.  Final Clinical Impressions(s) / ED Diagnoses   Final diagnoses:  Contusion of right knee, initial encounter    ED Discharge Orders         Ordered    naproxen (NAPROSYN) 500 MG tablet  2 times daily     01/21/18 1024           Delia Heady, PA-C 01/21/18 1026    Quintella Reichert, MD 01/21/18 [phone]

## 2018-01-21 NOTE — ED Notes (Signed)
Pt verbalized understanding of discharge instructions and denies any further questions at this time.   

## 2018-01-21 NOTE — ED Triage Notes (Signed)
Pt states [place]. She has pain from her right thigh down the leg, worse in the knee.

## 2018-01-21 NOTE — Discharge Instructions (Signed)
Return to ED for worsening symptoms, numbness in arms or legs, red hot or tender joint, injuries or falls, trouble walking.

## 2018-03-07 ENCOUNTER — Ambulatory Visit: Payer: BLUE CROSS/BLUE SHIELD | Admitting: Family Medicine

## 2018-03-10 ENCOUNTER — Ambulatory Visit: Payer: Medicaid Other | Admitting: Family Medicine

## 2018-03-10 VITALS — BP 120/80 | HR 84 | Temp 98.7°F | Resp 12 | Ht 63.0 in | Wt 172.4 lb

## 2018-03-10 DIAGNOSIS — M47816 Spondylosis without myelopathy or radiculopathy, lumbar region: Secondary | ICD-10-CM

## 2018-03-10 DIAGNOSIS — R208 Other disturbances of skin sensation: Secondary | ICD-10-CM | POA: Diagnosis not present

## 2018-03-10 DIAGNOSIS — I10 Essential (primary) hypertension: Secondary | ICD-10-CM | POA: Diagnosis not present

## 2018-03-10 DIAGNOSIS — Z23 Encounter for immunization: Secondary | ICD-10-CM | POA: Diagnosis not present

## 2018-03-10 DIAGNOSIS — N938 Other specified abnormal uterine and vaginal bleeding: Secondary | ICD-10-CM

## 2018-03-10 LAB — BASIC METABOLIC PANEL
BUN: 11 mg/dL (ref 6–23)
CHLORIDE: 106 meq/L (ref 96–112)
CO2: 26 mEq/L (ref 19–32)
CREATININE: 0.68 mg/dL (ref 0.40–1.20)
Calcium: 9.8 mg/dL (ref 8.4–10.5)
GFR: 118.41 mL/min (ref >60.00)
Glucose, Bld: 98 mg/dL (ref 70–99)
POTASSIUM: 4 meq/L (ref 3.5–5.1)
Sodium: 142 mEq/L (ref 135–145)

## 2018-03-10 LAB — CBC
HCT: 40.9 % (ref 36.0–46.0)
Hemoglobin: 13.6 g/dL (ref 12.0–15.0)
MCHC: 33.2 g/dL (ref 30.0–36.0)
MCV: 84.6 fl (ref 78.0–100.0)
Platelets: 337 10*3/uL (ref 150.0–400.0)
RBC: 4.83 Mil/uL (ref 3.87–5.11)
RDW: 13.9 % (ref 11.5–15.5)
WBC: 5.2 10*3/uL (ref 4.0–10.5)

## 2018-03-10 LAB — TSH: TSH: 1.68 u[IU]/mL (ref 0.35–4.50)

## 2018-03-10 MED ORDER — HYDROCHLOROTHIAZIDE 25 MG PO TABS: 25.0000 mg | ORAL_TABLET | Freq: Every day | ORAL | 1 refills | Status: DC

## 2018-03-10 MED ORDER — GABAPENTIN 300 MG PO CAPS: 300.0000 mg | ORAL_CAPSULE | Freq: Every day | ORAL | 3 refills | Status: DC

## 2018-03-10 MED ORDER — AMLODIPINE BESYLATE 5 MG PO TABS: 5.0000 mg | ORAL_TABLET | Freq: Every day | ORAL | 1 refills | Status: DC

## 2018-03-10 MED ORDER — TIZANIDINE HCL 4 MG PO TABS: ORAL_TABLET | ORAL | 1 refills | Status: DC

## 2018-03-10 NOTE — Progress Notes (Signed)
Patricia [name] is a 47 y.o.female, who is here today to follow on HTN. Last follow up visit: 11/04/17.  Currently on Amlodipine 5 mg and HCTZ 25 mg daily. . She is taking medications as instructed, no side effects reported.  She has not noted unusual headache, occipital dull headache since 04/2016 and stable. Negative for visual changes, exertional chest pain, dyspnea,  focal weakness, or edema. Home BP readings: She does not check BP often,last time she did it was 135/89.  Still smoking,decreased cig/day while she was wearing Nicotine patch but she had difficulty keeping it on skin.    Lab Results  Component Value Date   CREATININE 0.70 04/08/2016   BUN 6 04/08/2016   NA 141 04/08/2016   K 3.8 04/08/2016   CL 104 04/08/2016   CO2 26 05/05/2013    About a week ago she had 2 days of vaginal bleeding. LMP in her 41s. She denies sexual intercourse. Mild lower abdominal cramps. She denies nausea or vomiting. No urinary symptoms.  She is also c/o burning like sensation of LE's,mainly thighs. It has been worse for the past 2 months.  Hx of chronic pain, mainly left shoulder, upper and lower back pain. Low back pain is "not so good." She has taken Zanaflex, which helped with back pain. She is not longer on Gabapentin 300 mg tid. She was following with pain management but due to insurance problem she has not followed for 2 months. She states that she called and let them know.   She is also c/o having panic attacks. Hx of bipolar disorder,she is following with psychiatrist and today she has an appt.  Denies depressed mood or suicidal thoughts.   Review of Systems  Constitutional: Negative for activity change, appetite change, fatigue and fever.  HENT: Negative for mouth sores, nosebleeds and trouble swallowing.   Eyes: Negative for redness and visual disturbance.  Respiratory: Negative for cough, shortness of breath and wheezing.   Cardiovascular: Negative for  chest pain, palpitations and leg swelling.  Gastrointestinal: Negative for abdominal pain, nausea and vomiting.       Negative for changes in bowel habits.  Endocrine: Negative for cold intolerance and heat intolerance.  Genitourinary: Positive for vaginal bleeding. Negative for decreased urine volume, dysuria, hematuria and vaginal discharge.  Musculoskeletal: Positive for arthralgias, back pain, myalgias and neck pain. Negative for gait problem.  Skin: Negative for color change and rash.  Neurological: Positive for headaches (chronic and stable.). Negative for syncope and weakness.  Psychiatric/Behavioral: Negative for confusion and hallucinations. The patient is nervous/anxious.      Current Outpatient Medications on File Prior to Visit  Medication Sig Dispense Refill  . aspirin 81 MG chewable tablet Chew 81 mg by mouth daily as needed for mild pain.    . Multiple Vitamins-Minerals (MULTIVITAMIN WITH MINERALS) tablet Take 1 tablet by mouth daily.    . nicotine (NICODERM CQ - DOSED IN MG/24 HOURS) 14 mg/24hr patch Place 1 patch (14 mg total) onto the skin daily. 28 patch 1  . traMADol (ULTRAM) 50 MG tablet Take 1 tablet (50 mg total) by mouth 2 (two) times daily. 60 tablet 2  . valACYclovir (VALTREX) 1000 MG tablet Take 1 tablet (1,000 mg total) by mouth 3 (three) times daily. (Patient taking differently: Take 1,000 mg by mouth 3 (three) times daily. ) 21 tablet 0   No current facility-administered medications on file prior to visit.      Past Medical History:  Diagnosis Date  . Depression   . Pregnancy induced hypertension   . Rheumatoid arthritis ([place])   . Seizure disorder ([place])     No Known Allergies  Social History   Socioeconomic History  . Marital status: Single    Spouse name: Not on file  . Number of children: Not on file  . Years of education: Not on file  . Highest education level: Not on file  Occupational History  . Not on file  Social Needs  . Financial  resource strain: Not on file  . Food insecurity:    Worry: Not on file    Inability: Not on file  . Transportation needs:    Medical: Not on file    Non-medical: Not on file  Tobacco Use  . Smoking status: Current Some Day Smoker    Packs/day: 0.10    Years: 20.00    Pack years: 2.00    Start date: 08/31/2016  . Smokeless tobacco: Never Used  Substance and Sexual Activity  . Alcohol use: No    Comment: occasionaly  . Drug use: Not Currently    Types: Marijuana  . Sexual activity: Yes    Birth control/protection: None  Lifestyle  . Physical activity:    Days per week: Not on file    Minutes per session: Not on file  . Stress: Not on file  Relationships  . Social connections:    Talks on phone: Not on file    Gets together: Not on file    Attends religious service: Not on file    Active member of club or organization: Not on file    Attends meetings of clubs or organizations: Not on file    Relationship status: Not on file  Other Topics Concern  . Not on file  Social History Narrative  . Not on file    Vitals:   03/10/18 0850  BP: 120/80  Pulse: 84  Resp: 12  Temp: 98.7 F (37.1 C)  SpO2: 98%   Body mass index is 30.54 kg/m.    Physical Exam  Nursing note and vitals reviewed. Constitutional: She is oriented to person, place, and time. She appears well-developed. No distress.  HENT:  Head: Normocephalic and atraumatic.  Mouth/Throat: Oropharynx is clear and moist and mucous membranes are normal.  Eyes: Pupils are equal, round, and reactive to light. Conjunctivae are normal.  Cardiovascular: Normal rate and regular rhythm.  No murmur heard. Pulses:      Dorsalis pedis pulses are 2+ on the right side, and 2+ on the left side.  Respiratory: Effort normal and breath sounds normal. No respiratory distress.  GI: Soft. She exhibits no mass. There is no hepatomegaly. There is no tenderness.  Musculoskeletal: She exhibits no edema.       Cervical back: She  exhibits decreased range of motion (left rotation) and tenderness.       Back:  Lymphadenopathy:    She has no cervical adenopathy.  Neurological: She is alert and oriented to person, place, and time. She has normal strength. No cranial nerve deficit. Gait normal.  Skin: Skin is warm. No rash noted. No erythema.  Psychiatric: Her mood appears anxious.  Fairly groomed, good eye contact.    ASSESSMENT AND PLAN:   Patricia [name] was seen today for leg pain, medication refill, arm pain and referral.  Orders Placed This Encounter  Procedures  . Flu Vaccine QUAD 36+ mos IM  . Basic metabolic panel  . CBC  .  TSH  . Ambulatory referral to Gynecology  . Ambulatory referral to Pain Clinic  . POCT urine pregnancy   Lab Results  Component Value Date   WBC 5.2 03/10/2018   HGB 13.6 03/10/2018   HCT 40.9 03/10/2018   MCV 84.6 03/10/2018   PLT 337.0 03/10/2018   Lab Results  Component Value Date   TSH 1.68 03/10/2018   Lab Results  Component Value Date   CREATININE 0.68 03/10/2018   BUN 11 03/10/2018   NA 142 03/10/2018   K 4.0 03/10/2018   CL 106 03/10/2018   CO2 26 03/10/2018    :Hypertension, essential, benign Adequately controlled. No changes in current management. DASH-low salt diet to continue. Eye exam recommended annually. F/U in 6 months, before if needed.   Spondylosis without myelopathy or radiculopathy, lumbar region Burning sensation on lower extremities could be related to this problem. Recommend to re-schedule a f/u appointment with orthopedist. Resume gabapentin at 300 mg at bedtime. Instructed about warning signs. Continue Zanaflex 4 mg tid as needed,side effects reviewed.   DUB (dysfunctional uterine bleeding) Possible etiologies discussed. Gyn referral placed,she is going to need further work-up. Instructed about warning signs. Further recommendations will be given according to labs results.  -     CBC -     TSH -     POCT urine pregnancy -      Ambulatory referral to Gynecology  Burning sensation of lower extremity ? Radicular pain. Gabapentin 300 mg at bedtime, it can be titrated as tolerated. Instructed about warning signs. F/U in 4 months.  Needs flu shot -     Flu Vaccine QUAD 36+ mos IM    Recommend keeping appt today with her psychiatrist and discuss anxiety episodes. Pain management referral placed, in case she needed a new one. Encouraged smoking cessation, she can try Nicotine patches again place it on abdomen instead arms.    Nethaniel Mattie G. [place], MD  [facility]. [place] office.

## 2018-03-10 NOTE — Assessment & Plan Note (Signed)
Adequately controlled. No changes in current management. DASH-low salt diet to continue. Eye exam recommended annually. F/U in 6 months, before if needed.

## 2018-03-10 NOTE — Assessment & Plan Note (Signed)
Burning sensation on lower extremities could be related to this problem. Recommend to re-schedule a f/u appointment with orthopedist. Resume gabapentin at 300 mg at bedtime. Instructed about warning signs.

## 2018-03-10 NOTE — Patient Instructions (Signed)
A few things to remember from today's visit:   DUB (dysfunctional uterine bleeding) - Plan: CBC, TSH, POCT urine pregnancy, Ambulatory referral to Gynecology  Chronic bilateral low back pain with bilateral sciatica - Plan: gabapentin (NEURONTIN) 300 MG capsule  Hypertension, essential, benign - Plan: Basic metabolic panel, hydrochlorothiazide (HYDRODIURIL) 25 MG tablet, amLODipine (NORVASC) 5 MG tablet  Burning on your legs could be related to your back, resume gabapentin take medication at night. Reschedule appointment with pain clinic. Schedule appointment with your back doctor. No changes in your blood pressure medications. Discussed anxiety with your psychiatrist today.  I will see you back in 3 to 4 months for your physical.  Please be sure medication list is accurate. If a new problem present, please set up appointment sooner than planned today.

## 2018-03-13 ENCOUNTER — Encounter: Payer: Self-pay | Admitting: Family Medicine

## 2018-03-18 ENCOUNTER — Encounter: Payer: Self-pay | Admitting: Gynecology

## 2018-03-18 ENCOUNTER — Ambulatory Visit (INDEPENDENT_AMBULATORY_CARE_PROVIDER_SITE_OTHER): Payer: Medicaid Other | Admitting: Gynecology

## 2018-03-18 VITALS — BP 118/76 | Ht 63.0 in | Wt 171.0 lb

## 2018-03-18 DIAGNOSIS — Z0001 Encounter for general adult medical examination with abnormal findings: Secondary | ICD-10-CM

## 2018-03-18 DIAGNOSIS — Z01419 Encounter for gynecological examination (general) (routine) without abnormal findings: Secondary | ICD-10-CM

## 2018-03-18 DIAGNOSIS — Z01411 Encounter for gynecological examination (general) (routine) with abnormal findings: Secondary | ICD-10-CM | POA: Diagnosis not present

## 2018-03-18 DIAGNOSIS — D259 Leiomyoma of uterus, unspecified: Secondary | ICD-10-CM | POA: Diagnosis not present

## 2018-03-18 DIAGNOSIS — N3000 Acute cystitis without hematuria: Secondary | ICD-10-CM

## 2018-03-18 DIAGNOSIS — N926 Irregular menstruation, unspecified: Secondary | ICD-10-CM

## 2018-03-18 DIAGNOSIS — Z1151 Encounter for screening for human papillomavirus (HPV): Secondary | ICD-10-CM

## 2018-03-18 DIAGNOSIS — N912 Amenorrhea, unspecified: Secondary | ICD-10-CM

## 2018-03-18 MED ORDER — NITROFURANTOIN MONOHYD MACRO 100 MG PO CAPS: 100.0000 mg | ORAL_CAPSULE | Freq: Two times a day (BID) | ORAL | 0 refills | Status: DC

## 2018-03-18 NOTE — Progress Notes (Signed)
    Patricia [name] 1970-11-12 [id number]        47 y.o.  H1T0569 new patient for annual gynecologic exam.  Has not had a checkup for for several years.  Last menstrual period several years ago.  Does have spotting several times monthly since then.  Also notes suprapubic pressure, urinary frequency and dysuria.  No fever or chills.  Notes constipation which she will go once a week.  No diarrhea nausea or vomiting.  Currently not sexually active for the past several years.  No history of abnormal Pap smears.  Does have a history of fibroids.  Past medical history,surgical history, problem list, medications, allergies, family history and social history were all reviewed and documented as reviewed in the EPIC chart.  ROS:  Performed with pertinent positives and negatives included in the history, assessment and plan.   Additional significant findings : None   Exam: Caryn Bee assistant Vitals:   03/18/18 1142  BP: 118/76  Weight: 171 lb (77.6 kg)  Height: 5\' 3"  (1.6 m)   Body mass index is 30.29 kg/m.  General appearance:  Normal affect, orientation and appearance. Skin: Grossly normal HEENT: Without gross lesions.  No cervical or supraclavicular adenopathy. Thyroid normal.  Lungs:  Clear without wheezing, rales or rhonchi Cardiac: RR, without RMG Abdominal:  Soft, nontender, without masses, guarding, rebound, organomegaly or hernia Breasts:  Examined lying and sitting without masses, retractions, discharge or axillary adenopathy. Pelvic:  Ext, BUS, Vagina: Normal  Cervix: Normal.  Pap smear/HPV  Uterus: Anteverted, normal size, shape and contour, midline and mobile nontender   Adnexa: Without masses or tenderness    Anus and perineum: Normal   Rectovaginal: Normal sphincter tone without palpated masses or tenderness.    Assessment/Plan:  47 y.o. [id number] female for annual gynecologic exam.   1. Without menses over several years although spotting monthly.  Discussed possible  menopause versus anovulatory amenorrhea.  Not having significant hot flushes or sweats.  Recently had normal TSH and her chart.  History of leiomyoma although uterus overall palpates normal.  Ultrasound from 2013 showed uterus overall normal size with multiple small myomas.  Check baseline FSH.  Schedule sonohysterogram for leiomyoma follow-up and endometrial assessment rule out pathology such as polyps, submucous myomas, hyperplasia, uterine carcinoma.  Patient will schedule in follow-up for this. 2. Suprapubic pressure, dysuria and urinary frequency.  Urine analysis consistent with UTI.  Will treat with Macrobid 100 mg twice daily x7 days.  Follow-up if symptoms persist, worsen or recur. 3. Constipation.  Recommended patient start with Metamucil or FiberCon daily.  Push fluids.  Possible more aggressive laxatives if this is unsuccessful. 4. Mammography several years ago.  Recommended baseline mammogram now.  Names and numbers provided for patient to call and schedule.  Breast exam normal today. 5. Pap smear 2012.  Pap smear/HPV today. 6. Health maintenance.  No routine lab work done as patient reports this done elsewhere.  Follow-up for sonohysterogram.   Anastasio Auerbach MD, 12:56 PM 03/18/2018

## 2018-03-18 NOTE — Addendum Note (Signed)
Addended by: Nelva Nay on: 03/18/2018 01:56 PM   Modules accepted: Orders

## 2018-03-18 NOTE — Patient Instructions (Addendum)
Follow-up for the ultrasound as scheduled  Take the antibiotic twice daily for 7 days to treat the urinary infection  Call to Schedule your mammogram  Facilities in [place]: 1)  [facility] [address] [place] Phone: [phone])  Dr. Isaiah Blakes at [facility] [address] Phone: [phone]

## 2018-03-19 LAB — FOLLICLE STIMULATING HORMONE: FSH: 45.1 m[IU]/mL

## 2018-03-20 LAB — PAP IG AND HPV HIGH-RISK: HPV DNA HIGH RISK: NOT DETECTED

## 2018-03-20 LAB — URINALYSIS, COMPLETE W/RFL CULTURE
Bilirubin Urine: NEGATIVE
Glucose, UA: NEGATIVE
Hyaline Cast: NONE SEEN /LPF
Ketones, ur: NEGATIVE
Leukocyte Esterase: NEGATIVE
Nitrites, Initial: NEGATIVE
PH: 5.5 (ref 5.0–8.0)
Specific Gravity, Urine: 1.02 (ref 1.001–1.03)

## 2018-03-20 LAB — CULTURE INDICATED

## 2018-03-20 LAB — URINE CULTURE
MICRO NUMBER:: 91514318
SPECIMEN QUALITY:: ADEQUATE

## 2018-04-07 ENCOUNTER — Encounter: Payer: Medicaid Other | Attending: Physical Medicine & Rehabilitation

## 2018-04-07 ENCOUNTER — Ambulatory Visit: Payer: Medicaid Other | Admitting: Physical Medicine & Rehabilitation

## 2018-04-07 DIAGNOSIS — M47816 Spondylosis without myelopathy or radiculopathy, lumbar region: Secondary | ICD-10-CM | POA: Insufficient documentation

## 2018-04-07 DIAGNOSIS — Z5181 Encounter for therapeutic drug level monitoring: Secondary | ICD-10-CM | POA: Insufficient documentation

## 2018-04-07 DIAGNOSIS — F17210 Nicotine dependence, cigarettes, uncomplicated: Secondary | ICD-10-CM | POA: Insufficient documentation

## 2018-04-07 DIAGNOSIS — M545 Low back pain: Secondary | ICD-10-CM | POA: Insufficient documentation

## 2018-04-07 DIAGNOSIS — G8929 Other chronic pain: Secondary | ICD-10-CM | POA: Insufficient documentation

## 2018-04-07 DIAGNOSIS — M7502 Adhesive capsulitis of left shoulder: Secondary | ICD-10-CM | POA: Insufficient documentation

## 2018-04-07 DIAGNOSIS — Z9889 Other specified postprocedural states: Secondary | ICD-10-CM | POA: Insufficient documentation

## 2018-04-09 ENCOUNTER — Other Ambulatory Visit: Payer: Self-pay | Admitting: Gynecology

## 2018-04-09 DIAGNOSIS — N939 Abnormal uterine and vaginal bleeding, unspecified: Secondary | ICD-10-CM

## 2018-04-16 ENCOUNTER — Ambulatory Visit (INDEPENDENT_AMBULATORY_CARE_PROVIDER_SITE_OTHER): Payer: Medicaid Other

## 2018-04-16 ENCOUNTER — Other Ambulatory Visit: Payer: Self-pay | Admitting: Gynecology

## 2018-04-16 ENCOUNTER — Encounter: Payer: Self-pay | Admitting: Gynecology

## 2018-04-16 ENCOUNTER — Ambulatory Visit: Payer: Medicaid Other | Admitting: Gynecology

## 2018-04-16 VITALS — BP 124/78

## 2018-04-16 DIAGNOSIS — D250 Submucous leiomyoma of uterus: Secondary | ICD-10-CM

## 2018-04-16 DIAGNOSIS — N926 Irregular menstruation, unspecified: Secondary | ICD-10-CM

## 2018-04-16 DIAGNOSIS — D252 Subserosal leiomyoma of uterus: Secondary | ICD-10-CM

## 2018-04-16 DIAGNOSIS — D251 Intramural leiomyoma of uterus: Secondary | ICD-10-CM

## 2018-04-16 DIAGNOSIS — N939 Abnormal uterine and vaginal bleeding, unspecified: Secondary | ICD-10-CM

## 2018-04-16 DIAGNOSIS — N9489 Other specified conditions associated with female genital organs and menstrual cycle: Secondary | ICD-10-CM

## 2018-04-16 NOTE — Patient Instructions (Signed)
Follow up for ultrasound in 6 months 

## 2018-04-16 NOTE — Progress Notes (Signed)
    Patricia [name] May 24, 1970 [id number]        48 y.o.  [id number] presents for sonohysterogram.  History of spotting on and off over the past several years.  Last normal menstrual period several years ago.  Recent lab work showed elevated FSH and normal TSH.  History of leiomyoma in the past.  Past medical history,surgical history, problem list, medications, allergies, family history and social history were all reviewed and documented in the EPIC chart.  Directed ROS with pertinent positives and negatives documented in the history of present illness/assessment and plan.  Exam: [place] assistant Vitals:   04/16/18 1246  BP: 124/78   General appearance:  Normal Abdomen soft nontender without masses guarding rebound Pelvic external BUS vagina normal.  Cervix normal.  Uterus grossly normal midline mobile nontender.  Adnexa without masses or tenderness.  Ultrasound transvaginal and transabdominal shows uterus grossly normal in size with multiple small myomas measuring 27 mm, 23 mm, 20 mm, 17 mm, 15 mm.  Endometrial echo difficult to trace but measures 3.4 mm in the lower uterine cavity.  Right and left ovaries normal.  Left adnexal cystic tubular serpentine area measuring 21 x 5 x 10 mm.  Negative color flow Doppler.  Cul-de-sac negative.  Sonohysterogram performed, sterile technique, single-tooth tenaculum anterior lip stabilization, easy cath introduction, poor distention with myoma abutting the endometrial cavity questionable submucous versus intramural.  Endometrial sample taken.  Patient tolerated well.  Assessment/Plan:  48 y.o. A6T0160 with spotting on and off since menopause.  Ultrasound difficult to outline the endometrial canal although endometrial echo appears thin.  Possible submucous component to her myomas.  Will follow-up for biopsy results.  Will monitor bleeding for now.  If continues to be an issue then will consider hysteroscopy with better assessment of the cavity and  resection of endometrial defects.  We discussed the tubular cystic area in the left adnexa which probably represents a small hydro-salpinx.  Recommended follow-up ultrasound in 6 months to relook at this area as well as recheck her myomas.  She will call me if she continues to have bleeding in the interim.    Anastasio Auerbach MD, 12:47 PM 04/16/2018

## 2018-04-24 ENCOUNTER — Encounter: Payer: Self-pay | Admitting: Gynecology

## 2018-04-24 ENCOUNTER — Ambulatory Visit: Payer: Medicaid Other | Admitting: Gynecology

## 2018-04-24 ENCOUNTER — Ambulatory Visit (INDEPENDENT_AMBULATORY_CARE_PROVIDER_SITE_OTHER): Payer: Medicaid Other | Admitting: Gynecology

## 2018-04-24 VITALS — BP 130/84

## 2018-04-24 DIAGNOSIS — D259 Leiomyoma of uterus, unspecified: Secondary | ICD-10-CM | POA: Diagnosis not present

## 2018-04-24 DIAGNOSIS — N939 Abnormal uterine and vaginal bleeding, unspecified: Secondary | ICD-10-CM | POA: Diagnosis not present

## 2018-04-24 DIAGNOSIS — N951 Menopausal and female climacteric states: Secondary | ICD-10-CM | POA: Diagnosis not present

## 2018-04-24 NOTE — Patient Instructions (Signed)
Office will call you to make arrangements for the hysteroscopy D&C D&C

## 2018-04-24 NOTE — Progress Notes (Signed)
    Patricia [name] [birth year] [id number]        48 y.o.  H4H8887 presents with a history of premature menopause without menses over the last 1 to 2 years.  Has had some spotting on and off.  Recent FSH elevated at 44.  Had sonohysterogram which showed leiomyoma.  Endometrial cavity was difficult to visualize with poor distention.  Endometrial biopsy showed no endometrial tissue.  Past medical history,surgical history, problem list, medications, allergies, family history and social history were all reviewed and documented in the EPIC chart.  Directed ROS with pertinent positives and negatives documented in the history of present illness/assessment and plan.  Exam: Vitals:   04/24/18 1416  BP: 130/84   General appearance:  Normal   Assessment/Plan:  49 y.o. [id number] perimenopausal with spotting on and off.  Ultrasound with poor visualization of the endometrium before and during fluid introduction.  Endometrial biopsy was inadequate showing no clear endometrial tissue.  We discussed the issues of missed pathology and differential to include no pathology other than her leiomyoma, endometrial pathology to include polyps, hyperplasia, atypia and carcinoma.  Options for further evaluation include follow-up ultrasound for endometrial relook, reattempt at endometrial biopsy today, hysteroscopy D&C for complete endometrial assessment.  Patient had significant discomfort with the sonohysterogram and does not want an endometrial biopsy attempt.  She preferred to move forward with the hysteroscopy D&C and we will go ahead make arrangements for this.  She also is having significant menopausal symptoms to include hot flushes and night sweats as well as vaginal irritation.  We discussed options to include HRT.  The risks versus benefits of HRT were discussed to include benefits of symptom relief and possible cardiovascular and bone health benefits particularly started in the 40s versus risks to include thrombosis  in the breast cancer issue.  She wants to go ahead and start and we will plan on doing so after the hysteroscopy D&C endometrial clearance.    Anastasio Auerbach MD, 2:57 PM 04/24/2018

## 2018-04-28 ENCOUNTER — Telehealth: Payer: Self-pay

## 2018-04-28 MED ORDER — MISOPROSTOL 100 MCG PO TABS: ORAL_TABLET | ORAL | 0 refills | Status: DC

## 2018-04-28 NOTE — Telephone Encounter (Signed)
I called patient and scheduled surgery for 05/23/17 at 10:00am at [facility].  Advised her check in time 2 hours earlier and she will need someone to drive her home and be with her.  I will mail her [facility] pamphlet.    Pre op appt with Dr. Loetta Rough scheduled 05/14/2018 at 12:20pm.  I discussed with patient the need for Cytotec tab vaginally hs before surgery. Rx sent. Patient was warned that she might experience light bleeding/spotting and/or cramping overnight as she uses this. Just means it is working. No worries.

## 2018-05-14 ENCOUNTER — Ambulatory Visit: Payer: Medicaid Other | Admitting: Gynecology

## 2018-05-15 ENCOUNTER — Ambulatory Visit: Payer: Medicaid Other | Admitting: Physical Medicine & Rehabilitation

## 2018-05-20 ENCOUNTER — Encounter: Payer: Self-pay | Admitting: Gynecology

## 2018-05-20 ENCOUNTER — Other Ambulatory Visit: Payer: Self-pay

## 2018-05-20 ENCOUNTER — Encounter (HOSPITAL_BASED_OUTPATIENT_CLINIC_OR_DEPARTMENT_OTHER): Payer: Self-pay | Admitting: *Deleted

## 2018-05-20 ENCOUNTER — Ambulatory Visit (INDEPENDENT_AMBULATORY_CARE_PROVIDER_SITE_OTHER): Payer: Medicaid Other | Admitting: Gynecology

## 2018-05-20 VITALS — BP 130/80

## 2018-05-20 DIAGNOSIS — D259 Leiomyoma of uterus, unspecified: Secondary | ICD-10-CM | POA: Diagnosis not present

## 2018-05-20 DIAGNOSIS — N950 Postmenopausal bleeding: Secondary | ICD-10-CM | POA: Diagnosis not present

## 2018-05-20 NOTE — Patient Instructions (Signed)
Followup for surgery as scheduled. 

## 2018-05-20 NOTE — H&P (Signed)
Patricia [name] 01-23-1971 [id number]   History and Physical  Chief complaint: Postmenopausal bleeding, leiomyoma, poor endometrial assessment  History of present illness: 48 y.o. [id number] with a history of spotting on and off over the past several years.  Reportedly underwent menopause several years ago without regular menses.  Recent FSH was elevated and her TSH was normal.  History of leiomyoma.  Sonohysterogram performed showed uterus overall grossly normal in size with multiple small myomas.  Endometrial echo was difficult to trace but measured 3.4 mm in the lower uterine cavity.  Sonohysterogram showed poor distention with questionable submucous myoma versus intramural abutting the cavity.  Endometrial sample showed no endometrial tissue.  Options for management were reviewed and ultimately the patient decided to proceed with hysteroscopy D&C for endometrial cavity clearance.  Past Medical History:  Diagnosis Date  . Abnormal perimenopausal bleeding   . Cervical dysplasia   . Depression   . Head trauma   . Pregnancy induced hypertension   . Rheumatoid arthritis ([place])   . Seizure disorder ([place])   . Shingles   . STD (sexually transmitted disease)    Gonorrhea    Past Surgical History:  Procedure Laterality Date  . BICEPS TENDON REPAIR    . CESAREAN SECTION    . SHOULDER SURGERY    . WISDOM TOOTH EXTRACTION      Family History  Problem Relation Age of Onset  . Cancer Maternal Grandfather        Lung    Social History:  reports that she has quit smoking. She started smoking about 20 months ago. She has a 2.00 pack-year smoking history. She has never used smokeless tobacco. She reports current alcohol use. She reports current drug use. Drug: Marijuana.  Allergies: No Known Allergies  Medications: See Epic for the most current list of medications.  ROS:  Was performed and pertinent positives and negatives are included in the history of present illness.  Exam: Vitals:   05/20/18 0832  Weight: 79.4 kg  Height: 5\' 3"  (1.6 m)  Caryn Bee assistant General appearance:  Normal HEENT normal Lungs clear Cardiac regular rate no rubs murmurs or gallops Abdomen soft nontender without masses guarding rebound Pelvic external BUS vagina normal.  Cervix normal.  Uterus grossly normal size midline mobile nontender.  Adnexa without masses or tenderness.   Assessment/Plan:  48 y.o. [id number] with spotting on and off over the last several years.  Elevated FSH.  Multiple small myomas.  Poor endometrial assessment on sonohysterogram with endometrial biopsy showing no endometrial tissue.  Options for management were reviewed and the patient wants to proceed with hysteroscopy D&C for endometrial clearance.  I reviewed the proposed surgery with the patient to include the expected intraoperative and postoperative courses as well as the recovery period. The use of the hysteroscope, resectoscope and the D&C portion were all discussed. The risks of surgery to include infection, prolonged antibiotics, hemorrhage necessitating transfusion and the risks of transfusion, including transfusion reaction, hepatitis, HIV, mad cow disease and other unknown entities were all discussed understood and accepted. The risk of damage to internal organs during the procedure, either immediately recognized or delay recognized, including vagina, cervix, uterus, possible perforation causing damage to bowel, bladder, ureters, vessels and nerves necessitating major exploratory reparative surgery and future reparative surgeries including bladder repair, ureteral damage repair, bowel resection, ostomy formation was also discussed understood and accepted. The patient's questions were answered to her satisfaction and she is ready to proceed with surgery.    Christia Reading  Corey Skains MD, 3:27 PM 05/20/2018

## 2018-05-20 NOTE — Progress Notes (Signed)
Spoke with Patricia [name] after midnight food, clear liquids until 700 am drink pre surgery shaek at 700 am then [name] meds to take: amlodipine, nicoderm patch, zanaflex prn Arrive 800 am 05-23-2008 [facility] Will arrange driver day of surgery ? Son to drive Has surgery orders in epic Has pat appointment 05-21-2018 100 pm for cbc, cmet, ekg and to pick up prem surgery shake

## 2018-05-20 NOTE — Progress Notes (Signed)
    Patricia [name] 1971/01/13 [id number]        48 y.o.  [id number] presents for her preoperative visit for her upcoming hysteroscopy D&C.  History of spotting on and off over the past several years.  Reportedly underwent menopause several years ago without regular menses.  Recent FSH was elevated and her TSH was normal.  History of leiomyoma.  Sonohysterogram performed showed uterus overall grossly normal in size with multiple small myomas.  Endometrial echo was difficult to trace but measured 3.4 mm in the lower uterine cavity.  Sonohysterogram showed poor distention with questionable submucous myoma versus intramural abutting the cavity.  Endometrial sample showed no endometrial tissue.  Options for management were reviewed and ultimately the patient decided to proceed with hysteroscopy D&C for endometrial cavity clearance.  Past medical history,surgical history, problem list, medications, allergies, family history and social history were all reviewed and documented in the EPIC chart.  Directed ROS with pertinent positives and negatives documented in the history of present illness/assessment and plan.  Exam: Caryn Bee assistant Vitals:   05/20/18 1508  BP: 130/80   General appearance:  Normal HEENT normal Lungs clear Cardiac regular rate no rubs murmurs or gallops Abdomen soft nontender without masses guarding rebound Pelvic external BUS vagina normal.  Cervix normal.  Uterus grossly normal size midline mobile nontender.  Adnexa without masses or tenderness.  Assessment/Plan:  48 y.o. [id number] with spotting on and off over the last several years.  Elevated FSH.  Multiple small myomas.  Poor endometrial assessment on sonohysterogram with endometrial biopsy showing no endometrial tissue.  Options for management were reviewed and the patient wants to proceed with hysteroscopy D&C for endometrial clearance.  I reviewed the proposed surgery with the patient to include the expected intraoperative  and postoperative courses as well as the recovery period. The use of the hysteroscope, resectoscope and the D&C portion were all discussed. The risks of surgery to include infection, prolonged antibiotics, hemorrhage necessitating transfusion and the risks of transfusion, including transfusion reaction, hepatitis, HIV, mad cow disease and other unknown entities were all discussed understood and accepted. The risk of damage to internal organs during the procedure, either immediately recognized or delay recognized, including vagina, cervix, uterus, possible perforation causing damage to bowel, bladder, ureters, vessels and nerves necessitating major exploratory reparative surgery and future reparative surgeries including bladder repair, ureteral damage repair, bowel resection, ostomy formation was also discussed understood and accepted. The patient's questions were answered to her satisfaction and she is ready to proceed with surgery.    Anastasio Auerbach MD, 3:23 PM 05/20/2018

## 2018-05-21 ENCOUNTER — Encounter (HOSPITAL_COMMUNITY)
Admission: RE | Admit: 2018-05-21 | Discharge: 2018-05-21 | Disposition: A | Discharge status: Home / Self Care | Payer: Medicaid Other | Source: Ambulatory Visit | Attending: Gynecology | Admitting: Gynecology

## 2018-05-21 DIAGNOSIS — Z01818 Encounter for other preprocedural examination: Secondary | ICD-10-CM | POA: Insufficient documentation

## 2018-05-21 LAB — COMPREHENSIVE METABOLIC PANEL
ALT: 16 U/L (ref 0–44)
AST: 19 U/L (ref 15–41)
Albumin: 4.2 g/dL (ref 3.5–5.0)
Alkaline Phosphatase: 97 U/L (ref 38–126)
Anion gap: 7 (ref 5–15)
BUN: 11 mg/dL (ref 6–20)
CO2: 25 mmol/L (ref 22–32)
Calcium: 9.1 mg/dL (ref 8.9–10.3)
Chloride: 108 mmol/L (ref 98–111)
Creatinine, Ser: 0.53 mg/dL (ref 0.44–1.00)
GFR calc Af Amer: >60 mL/min (ref >60)
GFR calc non Af Amer: >60 mL/min (ref >60)
Glucose, Bld: 90 mg/dL (ref 70–99)
POTASSIUM: 3.5 mmol/L (ref 3.5–5.1)
Sodium: 140 mmol/L (ref 135–145)
Total Bilirubin: 0.7 mg/dL (ref 0.3–1.2)
Total Protein: 7.2 g/dL (ref 6.5–8.1)

## 2018-05-21 LAB — CBC
HCT: 39 % (ref 36.0–46.0)
Hemoglobin: 12.5 g/dL (ref 12.0–15.0)
MCH: 27.8 pg (ref 26.0–34.0)
MCHC: 32.1 g/dL (ref 30.0–36.0)
MCV: 86.9 fL (ref 80.0–100.0)
NRBC: 0 % (ref 0.0–0.2)
Platelets: 356 10*3/uL (ref 150–400)
RBC: 4.49 MIL/uL (ref 3.87–5.11)
RDW: 14 % (ref 11.5–15.5)
WBC: 5.4 10*3/uL (ref 4.0–10.5)

## 2018-05-23 ENCOUNTER — Ambulatory Visit (HOSPITAL_BASED_OUTPATIENT_CLINIC_OR_DEPARTMENT_OTHER)
Admission: RE | Admit: 2018-05-23 | Discharge: 2018-05-23 | Disposition: A | Discharge status: Home / Self Care | Payer: Medicaid Other | Attending: Gynecology | Admitting: Gynecology

## 2018-05-23 ENCOUNTER — Ambulatory Visit (HOSPITAL_BASED_OUTPATIENT_CLINIC_OR_DEPARTMENT_OTHER): Payer: Medicaid Other | Admitting: Anesthesiology

## 2018-05-23 ENCOUNTER — Encounter (HOSPITAL_BASED_OUTPATIENT_CLINIC_OR_DEPARTMENT_OTHER)
Admission: RE | Disposition: A | Discharge status: Home / Self Care | Payer: Self-pay | Source: Home / Self Care | Attending: Gynecology

## 2018-05-23 ENCOUNTER — Encounter (HOSPITAL_BASED_OUTPATIENT_CLINIC_OR_DEPARTMENT_OTHER): Payer: Self-pay

## 2018-05-23 DIAGNOSIS — F319 Bipolar disorder, unspecified: Secondary | ICD-10-CM | POA: Diagnosis not present

## 2018-05-23 DIAGNOSIS — Z801 Family history of malignant neoplasm of trachea, bronchus and lung: Secondary | ICD-10-CM | POA: Insufficient documentation

## 2018-05-23 DIAGNOSIS — D259 Leiomyoma of uterus, unspecified: Secondary | ICD-10-CM | POA: Diagnosis not present

## 2018-05-23 DIAGNOSIS — N950 Postmenopausal bleeding: Secondary | ICD-10-CM | POA: Diagnosis present

## 2018-05-23 DIAGNOSIS — M069 Rheumatoid arthritis, unspecified: Secondary | ICD-10-CM | POA: Insufficient documentation

## 2018-05-23 DIAGNOSIS — G40909 Epilepsy, unspecified, not intractable, without status epilepticus: Secondary | ICD-10-CM | POA: Diagnosis not present

## 2018-05-23 DIAGNOSIS — I10 Essential (primary) hypertension: Secondary | ICD-10-CM | POA: Diagnosis not present

## 2018-05-23 HISTORY — DX: Excessive bleeding in the premenopausal period: N92.4

## 2018-05-23 HISTORY — PX: DILATATION & CURETTAGE/HYSTEROSCOPY WITH MYOSURE: SHX6511

## 2018-05-23 LAB — POCT PREGNANCY, URINE: Preg Test, Ur: NEGATIVE

## 2018-05-23 SURGERY — DILATATION & CURETTAGE/HYSTEROSCOPY WITH MYOSURE
Anesthesia: General | Site: Vagina

## 2018-05-23 MED ORDER — LACTATED RINGERS IV SOLN
INTRAVENOUS | Status: DC
  Administered 2018-05-23: 09:00:00 via INTRAVENOUS
  Filled 2018-05-23: qty 1000

## 2018-05-23 MED ORDER — ACETAMINOPHEN 500 MG PO TABS
ORAL_TABLET | ORAL | Status: AC
  Filled 2018-05-23: qty 2

## 2018-05-23 MED ORDER — OXYCODONE HCL 5 MG/5ML PO SOLN
5.0000 mg | Freq: Once | ORAL | Status: DC | PRN
  Filled 2018-05-23: qty 5

## 2018-05-23 MED ORDER — HYDROMORPHONE HCL 1 MG/ML IJ SOLN
INTRAMUSCULAR | Status: AC
  Filled 2018-05-23: qty 1

## 2018-05-23 MED ORDER — PROPOFOL 10 MG/ML IV BOLUS
INTRAVENOUS | Status: AC
  Filled 2018-05-23: qty 40

## 2018-05-23 MED ORDER — LIDOCAINE HCL 1 % IJ SOLN
INTRAMUSCULAR | Status: DC | PRN
  Administered 2018-05-23: 10 mL

## 2018-05-23 MED ORDER — HYDROMORPHONE HCL 1 MG/ML IJ SOLN
0.2500 mg | INTRAMUSCULAR | Status: DC | PRN
  Administered 2018-05-23: 0.5 mg via INTRAVENOUS
  Filled 2018-05-23: qty 0.5

## 2018-05-23 MED ORDER — KETOROLAC TROMETHAMINE 30 MG/ML IJ SOLN
INTRAMUSCULAR | Status: DC | PRN
  Administered 2018-05-23: 30 mg via INTRAVENOUS

## 2018-05-23 MED ORDER — DEXAMETHASONE SODIUM PHOSPHATE 10 MG/ML IJ SOLN
INTRAMUSCULAR | Status: DC | PRN
  Administered 2018-05-23: 10 mg via INTRAVENOUS

## 2018-05-23 MED ORDER — KETOROLAC TROMETHAMINE 30 MG/ML IJ SOLN
INTRAMUSCULAR | Status: AC
  Filled 2018-05-23: qty 1

## 2018-05-23 MED ORDER — KETOROLAC TROMETHAMINE 30 MG/ML IJ SOLN
30.0000 mg | Freq: Once | INTRAMUSCULAR | Status: DC | PRN
  Filled 2018-05-23: qty 1

## 2018-05-23 MED ORDER — DEXAMETHASONE SODIUM PHOSPHATE 10 MG/ML IJ SOLN
INTRAMUSCULAR | Status: AC
  Filled 2018-05-23: qty 1

## 2018-05-23 MED ORDER — LIDOCAINE 2% (20 MG/ML) 5 ML SYRINGE
INTRAMUSCULAR | Status: AC
  Filled 2018-05-23: qty 5

## 2018-05-23 MED ORDER — LIDOCAINE 2% (20 MG/ML) 5 ML SYRINGE
INTRAMUSCULAR | Status: DC | PRN
  Administered 2018-05-23: 60 mg via INTRAVENOUS

## 2018-05-23 MED ORDER — OXYCODONE HCL 5 MG PO TABS
5.0000 mg | ORAL_TABLET | Freq: Once | ORAL | Status: DC | PRN
  Filled 2018-05-23: qty 1

## 2018-05-23 MED ORDER — ONDANSETRON HCL 4 MG/2ML IJ SOLN
INTRAMUSCULAR | Status: AC
  Filled 2018-05-23: qty 2

## 2018-05-23 MED ORDER — PROMETHAZINE HCL 25 MG/ML IJ SOLN
6.2500 mg | INTRAMUSCULAR | Status: DC | PRN
  Filled 2018-05-23: qty 1

## 2018-05-23 MED ORDER — SODIUM CHLORIDE 0.9 % IV SOLN
2.0000 g | INTRAVENOUS | Status: AC
  Administered 2018-05-23: 2 g via INTRAVENOUS
  Filled 2018-05-23: qty 2

## 2018-05-23 MED ORDER — MIDAZOLAM HCL 2 MG/2ML IJ SOLN
INTRAMUSCULAR | Status: AC
  Filled 2018-05-23: qty 2

## 2018-05-23 MED ORDER — PROPOFOL 10 MG/ML IV BOLUS
INTRAVENOUS | Status: DC | PRN
  Administered 2018-05-23: 200 mg via INTRAVENOUS

## 2018-05-23 MED ORDER — ONDANSETRON HCL 4 MG/2ML IJ SOLN
INTRAMUSCULAR | Status: DC | PRN
  Administered 2018-05-23: 4 mg via INTRAVENOUS

## 2018-05-23 MED ORDER — SODIUM CHLORIDE 0.9 % IV SOLN
INTRAVENOUS | Status: AC
  Filled 2018-05-23: qty 2

## 2018-05-23 MED ORDER — MIDAZOLAM HCL 2 MG/2ML IJ SOLN
INTRAMUSCULAR | Status: DC | PRN
  Administered 2018-05-23: 2 mg via INTRAVENOUS

## 2018-05-23 MED ORDER — FENTANYL CITRATE (PF) 100 MCG/2ML IJ SOLN
INTRAMUSCULAR | Status: DC | PRN
  Administered 2018-05-23 (×2): 50 ug via INTRAVENOUS

## 2018-05-23 MED ORDER — ACETAMINOPHEN 500 MG PO TABS
1000.0000 mg | ORAL_TABLET | Freq: Once | ORAL | Status: AC
  Administered 2018-05-23: 1000 mg via ORAL
  Filled 2018-05-23: qty 2

## 2018-05-23 MED ORDER — SODIUM CHLORIDE 0.9 % IR SOLN
Status: DC | PRN
  Administered 2018-05-23: 3000 mL

## 2018-05-23 MED ORDER — FENTANYL CITRATE (PF) 100 MCG/2ML IJ SOLN
INTRAMUSCULAR | Status: AC
  Filled 2018-05-23: qty 2

## 2018-05-23 SURGICAL SUPPLY — 24 items
CANISTER SUCT 3000ML PPV (MISCELLANEOUS) ×3 IMPLANT
CATH ROBINSON RED A/P 16FR (CATHETERS) ×3 IMPLANT
COVER WAND RF STERILE (DRAPES) ×3 IMPLANT
DEVICE MYOSURE LITE (MISCELLANEOUS) IMPLANT
DEVICE MYOSURE REACH (MISCELLANEOUS) IMPLANT
DILATOR CANAL MILEX (MISCELLANEOUS) IMPLANT
GAUZE 4X4 16PLY RFD (DISPOSABLE) ×3 IMPLANT
GLOVE BIO SURGEON STRL SZ7 (GLOVE) ×1 IMPLANT
GLOVE BIO SURGEON STRL SZ7.5 (GLOVE) ×6 IMPLANT
GLOVE BIOGEL PI IND STRL 7.0 (GLOVE) IMPLANT
GLOVE BIOGEL PI IND STRL 7.5 (GLOVE) IMPLANT
GLOVE BIOGEL PI INDICATOR 7.0 (GLOVE) ×1
GLOVE BIOGEL PI INDICATOR 7.5 (GLOVE) ×1
GOWN STRL REUS W/TWL XL LVL3 (GOWN DISPOSABLE) ×3 IMPLANT
IV NS IRRIG 3000ML ARTHROMATIC (IV SOLUTION) ×3 IMPLANT
KIT PROCEDURE FLUENT (KITS) ×3 IMPLANT
KIT TURNOVER CYSTO (KITS) ×3 IMPLANT
MYOSURE XL FIBROID (MISCELLANEOUS)
PACK VAGINAL MINOR WOMEN LF (CUSTOM PROCEDURE TRAY) ×3 IMPLANT
PAD OB MATERNITY 4.3X12.25 (PERSONAL CARE ITEMS) ×3 IMPLANT
SEAL ROD LENS SCOPE MYOSURE (ABLATOR) ×3 IMPLANT
SYSTEM TISS REMOVAL MYOSURE XL (MISCELLANEOUS) IMPLANT
TOWEL OR 17X26 10 PK STRL BLUE (TOWEL DISPOSABLE) ×5 IMPLANT
WATER STERILE IRR 500ML POUR (IV SOLUTION) IMPLANT

## 2018-05-23 NOTE — Anesthesia Postprocedure Evaluation (Signed)
Anesthesia Post Note  Patient: Patricia [name]  Procedure(s) Performed: DILATATION & CURETTAGE/HYSTEROSCOPY (N/A Vagina )     Patient location during evaluation: PACU Anesthesia Type: General Level of consciousness: awake and alert Pain management: pain level controlled Vital Signs Assessment: post-procedure vital signs reviewed and stable Respiratory status: spontaneous breathing, nonlabored ventilation, respiratory function stable and patient connected to nasal cannula oxygen Cardiovascular status: blood pressure returned to baseline and stable Postop Assessment: no apparent nausea or vomiting Anesthetic complications: no    Last Vitals:  Vitals:   05/23/18 1123 05/23/18 1200  BP: 129/90 132/90  Pulse: (!) 56 (!) 58  Resp: 16 14  Temp: 36.7 C 36.6 C  SpO2: 97% 98%    Last Pain:  Vitals:   05/23/18 1200  TempSrc: Oral  PainSc: 0-No pain                 Ryan P Ellender

## 2018-05-23 NOTE — Anesthesia Procedure Notes (Signed)
Procedure Name: LMA Insertion Date/Time: 05/23/2018 9:50 AM Performed by: Wanita Chamberlain, CRNA Pre-anesthesia Checklist: Patient identified, Emergency Drugs available, Suction available, Patient being monitored and Timeout performed Patient Re-evaluated:Patient Re-evaluated prior to induction Oxygen Delivery Method: Circle system utilized Preoxygenation: Pre-oxygenation with 100% oxygen Induction Type: IV induction Ventilation: Mask ventilation without difficulty LMA: LMA inserted LMA Size: 4.0 Number of attempts: 1 Airway Equipment and Method: Bite block Placement Confirmation: breath sounds checked- equal and bilateral,  CO2 detector and positive ETCO2 Tube secured with: Tape Dental Injury: Teeth and Oropharynx as per pre-operative assessment

## 2018-05-23 NOTE — H&P (Signed)
The patient was examined.  I reviewed the proposed surgery and consent form with the patient.  The dictated history and physical is current and accurate and all questions were answered. The patient is ready to proceed with surgery and has a realistic understanding and expectation for the outcome.   Anastasio Auerbach MD, 8:44 AM 05/23/2018

## 2018-05-23 NOTE — Discharge Instructions (Signed)
° °  Postoperative Instructions Hysteroscopy D & C  Dr. Phineas Real and the nursing staff have discussed postoperative instructions with you.  If you have any questions please ask them before you leave the hospital, or call Dr Elisabeth Most office at [phone].    We would like to emphasize the following instructions:   ? Call the office to make your follow-up appointment as recommended by Dr Phineas Real (usually 1-2 weeks).  ? You were given a prescription, or one was ordered for you at the pharmacy you designated.  Get that prescription filled and take the medication according to instructions.  ? You may eat a regular diet, but slowly until you start having bowel movements.  ? Drink plenty of water daily.  ? Nothing in the vagina (intercourse, douching, objects of any kind) for two weeks.  When reinitiating intercourse, if it is uncomfortable, stop and make an appointment with Dr Phineas Real to be evaluated.  ? No driving for one to two days until the effects of anesthesia has worn off.  No traveling out of town for several days.  ? You may shower, but no baths for one week.  Walking up and down stairs is ok.  No heavy lifting, prolonged standing, repeated bending or any working out until your post op check.  ? Rest frequently, listen to your body and do not push yourself and overdo it.  ? Call if:  o Your pain medication does not seem strong enough. o Worsening pain or abdominal bloating o Persistent nausea or vomiting o Difficulty with urination or bowel movements. o Temperature of 101 degrees or higher. o Heavy vaginal bleeding.  If your period is due, you may use tampons. o You have any questions or concerns     Post Anesthesia Home Care Instructions  Activity: Get plenty of rest for the remainder of the day. A responsible individual must stay with you for 24 hours following the procedure.  For the next 24 hours, DO NOT: -Drive a car -Paediatric nurse -Drink alcoholic  beverages -Take any medication unless instructed by your physician -Make any legal decisions or sign important papers.  Meals: Start with liquid foods such as gelatin or soup. Progress to regular foods as tolerated. Avoid greasy, spicy, heavy foods. If nausea and/or vomiting occur, drink only clear liquids until the nausea and/or vomiting subsides. Call your physician if vomiting continues.  Special Instructions/Symptoms: Your throat may feel dry or sore from the anesthesia or the breathing tube placed in your throat during surgery. If this causes discomfort, gargle with warm salt water. The discomfort should disappear within 24 hours.  If you had a scopolamine patch placed behind your ear for the management of post- operative nausea and/or vomiting:  1. The medication in the patch is effective for 72 hours, after which it should be removed.  Wrap patch in a tissue and discard in the trash. Wash hands thoroughly with soap and water. 2. You may remove the patch earlier than 72 hours if you experience unpleasant side effects which may include dry mouth, dizziness or visual disturbances. 3. Avoid touching the patch. Wash your hands with soap and water after contact with the patch.     NO IBUPROFEN PRODUCTS (MOTRIN, ADVIL) OR ALEVE UNTIL 4:10PM TODAY.

## 2018-05-23 NOTE — Op Note (Signed)
Patricia [name] 1970/09/26 [id number]   Post Operative Note   Date of surgery:  05/23/2018  Pre Op Dx: Vaginal spotting, leiomyoma  Post Op Dx: Vaginal spotting, leiomyoma, inadequate hysteroscopy  Procedure: Hysteroscopy D&C  Surgeon:  Belinda Block Breyana Follansbee  Anesthesia:  General  EBL: 5 cc  Distended media discrepancy: 846 cc saline  Complications:  None  Specimen: Endometrial curetting to pathology  Findings: EUA: External BUS vagina normal.  Cervix normal.  Uterus grossly normal size midline mobile.  Adnexa without masses   Hysteroscopy: Inadequate noting endocervical canal and lower uterine segment visualized.  2 separate cavity like areas noted with larger area posterior to the right and smaller canal like area anterior left.  Septal like separation between.  No clear ostia or other normal upper fundal anatomy noted.  Throughout the procedure good cavity distention was noted without overt evidence of perforation.  Procedure:  The patient was taken to the operating room, was placed in the low dorsal lithotomy position, underwent general anesthesia, received a perineal/vaginal preparation per nursing personnel and the bladder was emptied with an in and out Foley catheterization. The timeout was performed by the surgical team. An EUA was performed. The patient was draped in the usual fashion. The cervix was visualized with a speculum, anterior lip grasped with a single-tooth tenaculum and a paracervical block was placed using 10 cc's of 1% lidocaine. The cervix was gently dilated to admit the Myosure hysteroscope and hysteroscopy was performed with findings noted above.  For fear of creating false channels and perforation the hysteroscopy portion was aborted and a gentle sharp curettage of the lower cavity was performed.  Repeat hysteroscopy showed good distention with no overt evidence of perforation.  The instruments were removed and adequate hemostasis was visualized at the tenaculum  site and external cervical os.  The specimens were identified for pathology.  The sponge, needle and instrument count were verified correct.  The patient was wanded per protocol.  The patient was given intraoperative Toradol, was awakened without difficulty and was taken to the recovery room in good condition having tolerated the procedure well.    Anastasio Auerbach MD, 10:20 AM 05/23/2018

## 2018-05-23 NOTE — Anesthesia Preprocedure Evaluation (Addendum)
Anesthesia Evaluation  Patient identified by MRN, date of birth, ID band Patient awake    Reviewed: Allergy & Precautions, NPO status , Patient's Chart, lab work & pertinent test results  Airway Mallampati: II  TM Distance: >3 FB Neck ROM: Full    Dental  (+) Partial Upper, Partial Lower, Dental Advisory Given,    Pulmonary former smoker,    Pulmonary exam normal breath sounds clear to auscultation       Cardiovascular hypertension, Pt. on medications Normal cardiovascular exam Rhythm:Regular Rate:Normal     Neuro/Psych Seizures -,  PSYCHIATRIC DISORDERS Depression Bipolar Disorder    GI/Hepatic negative GI ROS, Neg liver ROS,   Endo/Other  negative endocrine ROS  Renal/GU negative Renal ROS     Musculoskeletal  (+) Arthritis , Rheumatoid disorders,    Abdominal   Peds  Hematology negative hematology ROS (+)   Anesthesia Other Findings Perimenopausal bleeding, leiomyoma, inadequate ultrasound evaluation  Reproductive/Obstetrics hcg negative                          Anesthesia Physical Anesthesia Plan  ASA: II  Anesthesia Plan: General   Post-op Pain Management:    Induction: Intravenous  PONV Risk Score and Plan: 4 or greater and Midazolam, Dexamethasone, Ondansetron and Treatment may vary due to age or medical condition  Airway Management Planned: LMA  Additional Equipment:   Intra-op Plan:   Post-operative Plan: Extubation in OR  Informed Consent: I have reviewed the patients History and Physical, chart, labs and discussed the procedure including the risks, benefits and alternatives for the proposed anesthesia with the patient or authorized representative who has indicated his/her understanding and acceptance.     Dental advisory given  Plan Discussed with: CRNA  Anesthesia Plan Comments:         Anesthesia Quick Evaluation

## 2018-05-23 NOTE — Transfer of Care (Signed)
Immediate Anesthesia Transfer of Care Note  Patient: Patricia [name]  Procedure(s) Performed: DILATATION & CURETTAGE/HYSTEROSCOPY (N/A Vagina )  Patient Location: PACU  Anesthesia Type:General  Level of Consciousness: awake, alert , oriented and patient cooperative  Airway & Oxygen Therapy: Patient Spontanous Breathing and Patient connected to nasal cannula oxygen  Post-op Assessment: Report given to RN and Post -op Vital signs reviewed and stable  Post vital signs: Reviewed and stable  Last Vitals:  Vitals Value Taken Time  BP    Temp    Pulse 94 05/23/2018 10:27 AM  Resp 16 05/23/2018 10:27 AM  SpO2 99 % 05/23/2018 10:27 AM  Vitals shown include unvalidated device data.  Last Pain:  Vitals:   05/23/18 0829  TempSrc: Oral  PainSc: 4       Patients Stated Pain Goal: 4 (50/15/86 8257)  Complications: No apparent anesthesia complications

## 2018-05-26 ENCOUNTER — Encounter (HOSPITAL_BASED_OUTPATIENT_CLINIC_OR_DEPARTMENT_OTHER): Payer: Self-pay | Admitting: Gynecology

## 2018-05-27 ENCOUNTER — Other Ambulatory Visit: Payer: Self-pay | Admitting: Family Medicine

## 2018-05-27 ENCOUNTER — Telehealth: Payer: Self-pay | Admitting: *Deleted

## 2018-05-27 DIAGNOSIS — Z1231 Encounter for screening mammogram for malignant neoplasm of breast: Secondary | ICD-10-CM

## 2018-05-27 NOTE — Telephone Encounter (Signed)
Is it okay to place referral for neuro?

## 2018-05-27 NOTE — Telephone Encounter (Signed)
Copied from [place] [phone]. Topic: Referral - Request for Referral >> May 27, 2018  1:10 PM Marin Olp L wrote: Has patient seen PCP for this complaint? Yes.   *If NO, is insurance requiring patient see PCP for this issue before PCP can refer them? Referral for which specialty: Neorology Preferred provider/office: [facility] Reason for referral: burning sensations in legs  Please call pt to notify once referral is placed.

## 2018-05-28 ENCOUNTER — Other Ambulatory Visit: Payer: Self-pay | Admitting: *Deleted

## 2018-05-28 DIAGNOSIS — R208 Other disturbances of skin sensation: Secondary | ICD-10-CM

## 2018-05-28 NOTE — Telephone Encounter (Signed)
Referral placed as requested. Left detailed message informing patient. Nothing further needed.

## 2018-05-28 NOTE — Telephone Encounter (Signed)
I thought she was already established with a provider. It is okay to place neurology referral.  Thanks, BJ

## 2018-06-02 ENCOUNTER — Encounter: Payer: Medicaid Other | Attending: Physical Medicine & Rehabilitation

## 2018-06-02 ENCOUNTER — Other Ambulatory Visit: Payer: Self-pay

## 2018-06-02 ENCOUNTER — Ambulatory Visit: Payer: Medicaid Other | Admitting: Physical Medicine & Rehabilitation

## 2018-06-02 ENCOUNTER — Encounter: Payer: Self-pay | Admitting: Physical Medicine & Rehabilitation

## 2018-06-02 VITALS — BP 127/87 | HR 75 | Ht 60.0 in | Wt 159.0 lb

## 2018-06-02 DIAGNOSIS — Z5181 Encounter for therapeutic drug level monitoring: Secondary | ICD-10-CM | POA: Insufficient documentation

## 2018-06-02 DIAGNOSIS — M47816 Spondylosis without myelopathy or radiculopathy, lumbar region: Secondary | ICD-10-CM | POA: Diagnosis not present

## 2018-06-02 DIAGNOSIS — M7502 Adhesive capsulitis of left shoulder: Secondary | ICD-10-CM | POA: Insufficient documentation

## 2018-06-02 DIAGNOSIS — Z9889 Other specified postprocedural states: Secondary | ICD-10-CM | POA: Insufficient documentation

## 2018-06-02 DIAGNOSIS — F17210 Nicotine dependence, cigarettes, uncomplicated: Secondary | ICD-10-CM | POA: Diagnosis not present

## 2018-06-02 DIAGNOSIS — M545 Low back pain: Secondary | ICD-10-CM | POA: Insufficient documentation

## 2018-06-02 DIAGNOSIS — G8929 Other chronic pain: Secondary | ICD-10-CM | POA: Diagnosis present

## 2018-06-02 NOTE — Progress Notes (Signed)
Subjective:    Patient ID: Patricia [name], female    DOB: September 27, 1970, 48 y.o.   MRN: [id number]  HPI Right sided pain still improved since Right L3-4 medial branch and L5 dorsal ramus radiofrequency neurotomy 09/03/2017  Patient still has intermittent pain but this is milder than previously.  She does not feel like this is wearing off yet  Pain Inventory Average Pain 9 Pain Right Now 7 My pain is burning, stabbing and aching  In the last 24 hours, has pain interfered with the following? General activity 7 Relation with others 7 Enjoyment of life 7 What TIME of day is your pain at its worst? varies Sleep (in general) Poor  Pain is worse with: some activites Pain improves with: rest Relief from Meds: na  Mobility how many minutes can you walk? 10 ability to climb steps?  yes do you drive?  no  Function disabled: date disabled na I need assistance with the following:  dressing, bathing, meal prep, household duties and shopping  Neuro/Psych weakness tingling trouble walking spasms depression anxiety  Prior Studies Any changes since last visit?  no  Physicians involved in your care Any changes since last visit?  no   Family History  Problem Relation Age of Onset  . Cancer Maternal Grandfather        Lung   Social History   Socioeconomic History  . Marital status: Single    Spouse name: Not on file  . Number of children: Not on file  . Years of education: Not on file  . Highest education level: Not on file  Occupational History  . Not on file  Social Needs  . Financial resource strain: Not on file  . Food insecurity:    Worry: Not on file    Inability: Not on file  . Transportation needs:    Medical: Not on file    Non-medical: Not on file  Tobacco Use  . Smoking status: Former Smoker    Packs/day: 0.10    Years: 20.00    Pack years: 2.00    Start date: 08/31/2016  . Smokeless tobacco: Never Used  . Tobacco comment: quit 04-19-2018  Substance  and Sexual Activity  . Alcohol use: Yes    Comment: occasionaly  . Drug use: Yes    Types: Marijuana    Comment: last use 05-19-2018  . Sexual activity: Yes    Comment: 1st intercourse 17 yrs-5 partners  Lifestyle  . Physical activity:    Days per week: Not on file    Minutes per session: Not on file  . Stress: Not on file  Relationships  . Social connections:    Talks on phone: Not on file    Gets together: Not on file    Attends religious service: Not on file    Active member of club or organization: Not on file    Attends meetings of clubs or organizations: Not on file    Relationship status: Not on file  Other Topics Concern  . Not on file  Social History Narrative  . Not on file   Past Surgical History:  Procedure Laterality Date  . BICEPS TENDON REPAIR    . CESAREAN SECTION    . DILATATION & CURETTAGE/HYSTEROSCOPY WITH MYOSURE N/A 05/23/2018   Procedure: DILATATION & CURETTAGE/HYSTEROSCOPY;  Surgeon: Anastasio Auerbach, MD;  Location: [facility];  Service: Gynecology;  Laterality: N/A;  . SHOULDER SURGERY    . WISDOM TOOTH EXTRACTION  Past Medical History:  Diagnosis Date  . Abnormal perimenopausal bleeding   . Cervical dysplasia   . Depression   . Head trauma   . Pregnancy induced hypertension   . Rheumatoid arthritis ([place])   . Seizure disorder ([place])   . Shingles   . STD (sexually transmitted disease)    Gonorrhea   BP 127/87   Pulse 75   Ht 5' (1.524 m)   Wt 159 lb (72.1 kg)   LMP 07/30/2016   SpO2 97%   BMI 31.05 kg/m   Opioid Risk Score:   Fall Risk Score:  `1  Depression screen PHQ 2/9  Depression screen [facility] 2/9 06/02/2018 09/30/2017 08/09/2017  Decreased Interest 0 1 0  Down, Depressed, Hopeless 0 1 0  PHQ - 2 Score 0 2 0    Review of Systems  Constitutional: Positive for unexpected weight change.  HENT: Negative.   Eyes: Negative.   Respiratory: Negative.   Cardiovascular: Negative.   Gastrointestinal: Positive for  abdominal pain.  Endocrine: Negative.   Genitourinary: Positive for difficulty urinating. Negative for dyspareunia.  Musculoskeletal: Positive for gait problem.  Skin: Negative.   Allergic/Immunologic: Negative.   Neurological: Positive for weakness.       Tingling  Hematological: Negative.   Psychiatric/Behavioral: Negative.   All other systems reviewed and are negative.      Objective:   Physical Exam Vitals signs reviewed.  Constitutional:      Appearance: Normal appearance.  HENT:     Mouth/Throat:     Mouth: Mucous membranes are moist.     Pharynx: Oropharynx is clear.  Eyes:     Extraocular Movements: Extraocular movements intact.     Pupils: Pupils are equal, round, and reactive to light.  Neurological:     General: No focal deficit present.     Mental Status: She is alert and oriented to person, place, and time. Mental status is at baseline.  Psychiatric:        Mood and Affect: Mood normal.        Behavior: Behavior normal.   Left shoulder pain with abd   There is mild tenderness palpation lumbar paraspinals.  Lumbar range of motion moderately reduced flexion extension lateral bending and rotation. Negative straight leg raising Lower extremity strength is 5/5 bilateral hip flexor knee extensor ankle dorsiflex.     Assessment & Plan:  #1.  Lumbar spondylosis without myelopathy.  Improvements after R  lumbar radiofrequency neurotomy.  This was done at L3-4 medial branches and L5 dorsal ramus  No need for repeat procedure at this time. No longer taking tramadol We will see the patient back in 6 months.  She can call for repeat procedure if she has increasing pain prior to that time #2.  Adhesive capsulitis left shoulder.  Patient follows with Ortho.  May be a good candidate for suprascapular nerve block

## 2018-06-02 NOTE — Patient Instructions (Signed)

## 2018-06-03 ENCOUNTER — Encounter: Payer: Self-pay | Admitting: Neurology

## 2018-06-06 ENCOUNTER — Ambulatory Visit (INDEPENDENT_AMBULATORY_CARE_PROVIDER_SITE_OTHER): Payer: Medicaid Other | Admitting: Gynecology

## 2018-06-06 ENCOUNTER — Encounter: Payer: Self-pay | Admitting: Gynecology

## 2018-06-06 VITALS — BP 118/76

## 2018-06-06 DIAGNOSIS — Z9889 Other specified postprocedural states: Secondary | ICD-10-CM

## 2018-06-06 NOTE — Progress Notes (Signed)
    Patricia [name] 07/01/1970 [id number]        48 y.o.  [id number] presents for her postoperative visit status post hysteroscopy D&C.  Doing well without bleeding or pain.  Past medical history,surgical history, problem list, medications, allergies, family history and social history were all reviewed and documented in the EPIC chart.  Directed ROS with pertinent positives and negatives documented in the history of present illness/assessment and plan.  Exam: Caryn Bee assistant Vitals:   06/06/18 1401  BP: 118/76   General appearance:  Normal Abdomen soft nontender without masses guarding rebound Pelvic external BUS vagina normal.  Cervix normal.  Uterus grossly normal size midline mobile nontender.  Adnexa without masses or tenderness.  Assessment/Plan:  48 y.o. [id number] with hysteroscopy D&C for perimenopausal spotting.  Hysteroscopy showed a distorted cavity.  Reviewed findings with patient as to whether she had old infections or other causes for her cavity to become scarred in.  Endometrial biopsy did show atrophic endometrium and benign muscle tissue consistent with [place] being in the cavity.  The question as to whether we adequately visualize/sample the cavity was reviewed.  Limitations of further evaluation discussed.  At this point will monitor for any further bleeding.  As long as she remains without bleeding then will monitor.  If any further bleeding then we will rediscuss options.    Anastasio Auerbach MD, 2:17 PM 06/06/2018

## 2018-06-06 NOTE — Patient Instructions (Addendum)
Follow-up end of this year for your annual exam when due

## 2018-06-10 ENCOUNTER — Encounter: Payer: Medicaid Other | Admitting: Family Medicine

## 2018-06-19 ENCOUNTER — Emergency Department (HOSPITAL_COMMUNITY)
Admission: EM | Admit: 2018-06-19 | Discharge: 2018-06-19 | Discharge status: Against Medical Advice | Payer: Medicaid Other

## 2018-06-19 NOTE — ED Notes (Signed)
Went to room to see patient and there is not one in the room. Looked in all bathrooms, no patient found

## 2018-06-20 ENCOUNTER — Telehealth: Payer: Self-pay

## 2018-06-20 NOTE — Telephone Encounter (Signed)
At my last visit pt was not taking tramadol, if pt is experiencing more pain would take tylenol 650mg  up to QID and schedule repeat Lumbar L3-4-5 RFA

## 2018-06-20 NOTE — Telephone Encounter (Signed)
Notified Ms Monjaras about the tylenol, we will have to call about scheduling the appt next week.

## 2018-06-20 NOTE — Telephone Encounter (Signed)
Patient called requesting refill on Tramadol. Next appt 6 month f/u. Last filled Tramadol 10/18/17 according to PMP aware.

## 2018-06-25 ENCOUNTER — Ambulatory Visit: Payer: Medicaid Other

## 2018-06-25 ENCOUNTER — Telehealth: Payer: Self-pay | Admitting: Physical Medicine & Rehabilitation

## 2018-06-30 ENCOUNTER — Encounter: Payer: Self-pay | Admitting: Family Medicine

## 2018-06-30 ENCOUNTER — Other Ambulatory Visit: Payer: Self-pay

## 2018-06-30 ENCOUNTER — Ambulatory Visit (INDEPENDENT_AMBULATORY_CARE_PROVIDER_SITE_OTHER): Payer: Medicaid Other | Admitting: Family Medicine

## 2018-06-30 VITALS — BP 136/80 | HR 60 | Resp 12

## 2018-06-30 DIAGNOSIS — M792 Neuralgia and neuritis, unspecified: Secondary | ICD-10-CM | POA: Diagnosis not present

## 2018-06-30 DIAGNOSIS — R519 Headache, unspecified: Secondary | ICD-10-CM | POA: Insufficient documentation

## 2018-06-30 DIAGNOSIS — I10 Essential (primary) hypertension: Secondary | ICD-10-CM | POA: Diagnosis not present

## 2018-06-30 DIAGNOSIS — R51 Headache: Secondary | ICD-10-CM

## 2018-06-30 DIAGNOSIS — M542 Cervicalgia: Secondary | ICD-10-CM

## 2018-06-30 MED ORDER — GABAPENTIN 300 MG PO CAPS: 300.0000 mg | ORAL_CAPSULE | Freq: Three times a day (TID) | ORAL | 1 refills | Status: DC

## 2018-06-30 MED ORDER — AMLODIPINE BESYLATE 10 MG PO TABS: 10.0000 mg | ORAL_TABLET | Freq: Every day | ORAL | 1 refills | Status: DC

## 2018-06-30 NOTE — Assessment & Plan Note (Signed)
Otherwise adequately controlled, I would like BP <= 130/80. For now she will continue amlodipine 10 mg daily. Low-salt diet recommended. Continue monitoring BP. Follow-up pain 6 weeks.

## 2018-06-30 NOTE — Assessment & Plan Note (Signed)
This probably could be causing/aggravating occipital headache. This is a chronic problem, currently she is following with neurosurgeon. Muscle relaxant did not help, so discontinued. Local heat, ice, massage may help.

## 2018-06-30 NOTE — Progress Notes (Signed)
Virtual Visit via Video Note  I connected with Patricia [name] on 06/30/18 at  3:30 PM EDT by a video enabled telemedicine application and verified that I am speaking with the correct person using two identifiers.  Location patient: home Location provider:office Persons participating in the virtual visit: patient, provider  I discussed the limitations of evaluation and management by telemedicine and the availability of in person appointments. The patient expressed understanding and agreed to proceed.   HPI: Patricia. Blando is a 48 year old female with history of chronic pain (lower back and cervical pain), hypertension, and bipolar disorder among some. Today she is complaining about occipital headache, intermittently, about 3 times per week. 8/10 pressure like pain.  She has no identified exacerbating or alleviating factors. She has had problem for over a year, it seems to be stable.  Head CT on 05/18/2017: No cause for the patient's symptoms identified. No acute intracranial abnormalities.  She feels like pain is coming from cervical area. For chronic cervical pain she is on Zanaflex, which has not helped. She is taking OTC acetaminophen. She follows with Dr. Suzanna Obey for chronic pain management and with ortho for chronic left shoulder pain.  She is currently on gabapentin 300 mg daily, which was added to treat burning sensation on lower extremities. She is still having problem but it has improved after starting gabapentin. She denies focal weakness. She is tolerating gabapentin well.  She follows with psychiatrist, she was supposed to have a follow-up appointment last week but due to current concerns about coronavirus, it was canceled. Currently she is on Seroquel 100 mg at bedtime and Effexor Exar 37.5 mg daily.  Hypertension: Currently she is on amlodipine 5 mg 2 tablets daily and HCTZ 25 mg daily. She denies visual changes, dyspnea, chest pain, palpitations, or edema.  Lab Results   Component Value Date   CREATININE 0.53 05/21/2018   BUN 11 05/21/2018   NA 140 05/21/2018   K 3.5 05/21/2018   CL 108 05/21/2018   CO2 25 05/21/2018    ROS: See pertinent positives and negatives per HPI.  Past Medical History:  Diagnosis Date  . Abnormal perimenopausal bleeding   . Cervical dysplasia   . Depression   . Head trauma   . Pregnancy induced hypertension   . Rheumatoid arthritis ([place])   . Seizure disorder ([place])   . Shingles   . STD (sexually transmitted disease)    Gonorrhea    Past Surgical History:  Procedure Laterality Date  . BICEPS TENDON REPAIR    . CESAREAN SECTION    . DILATATION & CURETTAGE/HYSTEROSCOPY WITH MYOSURE N/A 05/23/2018   Procedure: DILATATION & CURETTAGE/HYSTEROSCOPY;  Surgeon: Anastasio Auerbach, MD;  Location: [facility];  Service: Gynecology;  Laterality: N/A;  . SHOULDER SURGERY    . WISDOM TOOTH EXTRACTION      Family History  Problem Relation Age of Onset  . Cancer Maternal Grandfather        Lung   Social History   Socioeconomic History  . Marital status: Single    Spouse name: Not on file  . Number of children: Not on file  . Years of education: Not on file  . Highest education level: Not on file  Occupational History  . Not on file  Social Needs  . Financial resource strain: Not on file  . Food insecurity:    Worry: Not on file    Inability: Not on file  . Transportation needs:    Medical: Not  on file    Non-medical: Not on file  Tobacco Use  . Smoking status: Former Smoker    Packs/day: 0.10    Years: 20.00    Pack years: 2.00    Start date: 08/31/2016  . Smokeless tobacco: Never Used  . Tobacco comment: quit 04-19-2018  Substance and Sexual Activity  . Alcohol use: Yes    Comment: occasionaly  . Drug use: Yes    Types: Marijuana    Comment: last use 05-19-2018  . Sexual activity: Yes    Comment: 1st intercourse 77 yrs-5 partners  Lifestyle  . Physical activity:    Days per week: Not on  file    Minutes per session: Not on file  . Stress: Not on file  Relationships  . Social connections:    Talks on phone: Not on file    Gets together: Not on file    Attends religious service: Not on file    Active member of club or organization: Not on file    Attends meetings of clubs or organizations: Not on file    Relationship status: Not on file  . Intimate partner violence:    Fear of current or ex partner: Not on file    Emotionally abused: Not on file    Physically abused: Not on file    Forced sexual activity: Not on file  Other Topics Concern  . Not on file  Social History Narrative  . Not on file    Current Outpatient Medications:  .  amLODipine (NORVASC) 10 MG tablet, Take 1 tablet (10 mg total) by mouth daily., Disp: 90 tablet, Rfl: 1 .  aspirin 81 MG chewable tablet, Chew 81 mg by mouth daily as needed for mild pain., Disp: , Rfl:  .  gabapentin (NEURONTIN) 300 MG capsule, Take 1 capsule (300 mg total) by mouth 3 (three) times daily., Disp: 90 capsule, Rfl: 1 .  hydrochlorothiazide (HYDRODIURIL) 25 MG tablet, Take 1 tablet (25 mg total) by mouth daily., Disp: 90 tablet, Rfl: 1 .  Multiple Vitamins-Minerals (MULTIVITAMIN WITH MINERALS) tablet, Take 1 tablet by mouth daily., Disp: , Rfl:  .  nicotine (NICODERM CQ - DOSED IN MG/24 HOURS) 14 mg/24hr patch, Place 1 patch (14 mg total) onto the skin daily. (Patient not taking: Reported on 06/06/2018), Disp: 28 patch, Rfl: 1 .  QUEtiapine (SEROQUEL) 100 MG tablet, Take 100 mg by mouth at bedtime., Disp: , Rfl:  .  traMADol (ULTRAM) 50 MG tablet, Take 1 tablet (50 mg total) by mouth 2 (two) times daily., Disp: 60 tablet, Rfl: 2 .  venlafaxine (EFFEXOR) 37.5 MG tablet, Take 37.5 mg by mouth once., Disp: , Rfl:   EXAM:  VITALS per patient if applicable:BP 009/38   Pulse 60   Resp 12   LMP 07/30/2016 (LMP Unknown)   GENERAL: alert, oriented, appears well and in no acute distress  HEENT: atraumatic, conjunctiva clear, no  obvious abnormalities on inspection of external nose and ears  NECK: Negative for deformities on inspection.  LUNGS: on inspection no signs of respiratory distress, breathing rate appears normal, no obvious gross SOB, gasping or wheezing  CV: no obvious cyanosis  Patricia: Decreased active ROM cervical spine and left shoulder.  PSYCH/NEURO: Fairly groomed. Pleasant and cooperative, no obvious depression or anxiety, speech and thought processing grossly intact  ASSESSMENT AND PLAN:  Discussed the following assessment and plan:   Neuropathic pain of lower extremity Gwyndolyn Saxon has improved with gabapentin 300 mg. Recommend titrating gabapentin from 300  mg daily to 300 mg 3 times daily as tolerated. We discussed some side effects of medication. Follow-up in 6 weeks.  Cervicalgia This probably could be causing/aggravating occipital headache. This is a chronic problem, currently she is following with neurosurgeon. Muscle relaxant did not help, so discontinued. Local heat, ice, massage may help.   Hypertension, essential, benign Otherwise adequately controlled, I would like BP <= 130/80. For now she will continue amlodipine 10 mg daily. Low-salt diet recommended. Continue monitoring BP. Follow-up pain 6 weeks.  Occipital headache Problem is stable + no associated neurologic deficit, so I do not think head imaging is needed at this time. We discussed a few pharmacologic options, because she is on Effexor and Seroquel I do not recommend adding amitriptyline, risk of med interaction. We could increase dose of Effexor from 37.5 mg to 75 mg, but she prefers to continue same dose for now. Gabapentin 300 mg 3 times daily may help. Instructed about warning signs.    I discussed the assessment and treatment plan with the patient. The patient was provided an opportunity to ask questions and all were answered. The patient agreed with the plan and demonstrated an understanding of the instructions.    The patient was advised to call back or seek an in-person evaluation if the symptoms worsen or if the condition fails to improve as anticipated.  Return in about 6 weeks (around 08/11/2018) for HA,HTN,neuropathic pain.   Betty Martinique, MD

## 2018-06-30 NOTE — Assessment & Plan Note (Signed)
Patricia [name] has improved with gabapentin 300 mg. Recommend titrating gabapentin from 300 mg daily to 300 mg 3 times daily as tolerated. We discussed some side effects of medication. Follow-up in 6 weeks.

## 2018-06-30 NOTE — Assessment & Plan Note (Addendum)
Problem is stable + no associated neurologic deficit, so I do not think head imaging is needed at this time. We discussed a few pharmacologic options, because she is on Effexor and Seroquel I do not recommend adding amitriptyline, risk of med interaction. We could increase dose of Effexor from 37.5 mg to 75 mg, but she prefers to continue same dose for now. Gabapentin 300 mg 3 times daily may help. Instructed about warning signs.

## 2018-07-28 ENCOUNTER — Ambulatory Visit: Payer: Medicaid Other | Admitting: Physical Medicine & Rehabilitation

## 2018-07-28 ENCOUNTER — Encounter: Payer: Self-pay | Admitting: Physical Medicine & Rehabilitation

## 2018-07-28 ENCOUNTER — Encounter: Payer: Medicaid Other | Attending: Physical Medicine & Rehabilitation

## 2018-07-28 ENCOUNTER — Other Ambulatory Visit: Payer: Self-pay

## 2018-07-28 VITALS — BP 136/97 | HR 71 | Temp 98.4°F | Ht 60.0 in | Wt 155.0 lb

## 2018-07-28 DIAGNOSIS — M47816 Spondylosis without myelopathy or radiculopathy, lumbar region: Secondary | ICD-10-CM | POA: Insufficient documentation

## 2018-07-28 DIAGNOSIS — F17210 Nicotine dependence, cigarettes, uncomplicated: Secondary | ICD-10-CM | POA: Diagnosis not present

## 2018-07-28 DIAGNOSIS — M7502 Adhesive capsulitis of left shoulder: Secondary | ICD-10-CM | POA: Insufficient documentation

## 2018-07-28 DIAGNOSIS — G8929 Other chronic pain: Secondary | ICD-10-CM | POA: Insufficient documentation

## 2018-07-28 DIAGNOSIS — Z9889 Other specified postprocedural states: Secondary | ICD-10-CM | POA: Diagnosis not present

## 2018-07-28 DIAGNOSIS — M545 Low back pain: Secondary | ICD-10-CM | POA: Diagnosis not present

## 2018-07-28 DIAGNOSIS — Z5181 Encounter for therapeutic drug level monitoring: Secondary | ICD-10-CM | POA: Diagnosis not present

## 2018-07-28 NOTE — Patient Instructions (Addendum)
You had a radio frequency procedure today This was done to alleviate joint pain in your lumbar area We injected lidocaine which is a local anesthetic.  You may experience soreness at the injection sites. You may also experienced some irritation of the nerves that were heated I'm recommending ice for 30 minutes every 2 hours as needed for the next 24-48 hours  Double up on your gabapentin today only

## 2018-07-28 NOTE — Progress Notes (Signed)
  PROCEDURE RECORD [facility]   Name: Patricia [name] DOB:28-Jul-1970 MRN: [id number]  Date:07/28/2018  Physician: Alysia Penna, MD    Nurse/CMA: Bright CMA  Allergies: No Known Allergies  Consent Signed: Yes.    Is patient diabetic? No.  CBG today? NA  Pregnant: No. LMP: Patient's last menstrual period was 07/30/2016 (lmp unknown). (age 50-55)  Anticoagulants: no Anti-inflammatory: no Antibiotics: no  Procedure: Right L3-5 Radiofrequency Neurotomy Position: Prone   Start Time: 251pm End Time: 307pm Fluoro Time: 47s  RN/CMA Bright CMA Bright CMA    Time 229pm 316pm    BP 136/97 144/96    Pulse 71 58    Respirations 16 16    O2 Sat 98 98    S/S 6 6    Pain Level 7/10 6/10     D/C home with Son, patient A & O X 3, D/C instructions reviewed, and sits independently.

## 2018-07-28 NOTE — Progress Notes (Signed)

## 2018-08-01 ENCOUNTER — Encounter: Payer: Medicaid Other | Admitting: Family Medicine

## 2018-08-06 ENCOUNTER — Telehealth: Payer: Self-pay | Admitting: *Deleted

## 2018-08-06 NOTE — Telephone Encounter (Signed)
Patricia [name] called and is asking for something for pain. Her back is hurting so bad and going around to the right side. Please advise.

## 2018-08-07 MED ORDER — TRAMADOL HCL 50 MG PO TABS: 50.0000 mg | ORAL_TABLET | Freq: Two times a day (BID) | ORAL | 2 refills | Status: DC

## 2018-08-08 NOTE — Telephone Encounter (Signed)
Patient notified. Agreement is on file.

## 2018-08-11 ENCOUNTER — Ambulatory Visit: Payer: Medicaid Other

## 2018-08-12 ENCOUNTER — Telehealth: Payer: Self-pay | Admitting: *Deleted

## 2018-08-12 NOTE — Telephone Encounter (Signed)
Patricia [name] called and her tramadol needs a PA. Please submit to [facility].

## 2018-08-13 NOTE — Telephone Encounter (Signed)
PA submitted to [facility] for Tramadol.

## 2018-08-15 ENCOUNTER — Ambulatory Visit: Payer: Medicaid Other | Admitting: Neurology

## 2018-08-28 ENCOUNTER — Telehealth: Payer: Self-pay | Admitting: Podiatry

## 2018-08-28 NOTE — Telephone Encounter (Signed)
I informed pt, routinely our doctors preferred [employer] running shoes and if she did not get any relief she needed to make an appt. Pt states understanding.

## 2018-08-28 NOTE — Telephone Encounter (Signed)
Patient called to ask about possible shoes that helps with her left heel pain she still has while walking. Verified that she still has [place]  Medicaid insurance. Please call when possible.

## 2018-09-01 ENCOUNTER — Telehealth: Payer: Self-pay | Admitting: *Deleted
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
## 2018-09-03 NOTE — Telephone Encounter (Signed)
Spoke with patient and she stated that she is feeling much better, blood pressure is back to normal. Patient advised to call office to schedule ov if she notices blood pressure elevating again. Patient verbalized understanding.   FYI sent to Dr. Martinique for review.

## 2018-09-05 ENCOUNTER — Telehealth: Payer: Self-pay | Admitting: Family Medicine

## 2018-09-05 NOTE — Telephone Encounter (Signed)
I do not see cyclobenzaprine on her medication list, I do not think I have prescribed it. Thanks, BJ

## 2018-09-05 NOTE — Telephone Encounter (Signed)
Message sent to Dr. Jordan for review and approval. 

## 2018-09-05 NOTE — Telephone Encounter (Signed)
Copied from [place] [id number]. Topic: Quick Communication - Rx Refill/Question >> Sep 05, 2018 11:09 AM Nils Flack wrote: Medication: cyclobenzaprine  Has the patient contacted their pharmacy? No. (Agent: If no, request that the patient contact the pharmacy for the refill.) (Agent: If yes, when and what did the pharmacy advise?)  Preferred Pharmacy (with phone number or street name): [facility] [address]   Agent: Please be advised that RX refills may take up to 3 business days. We ask that you follow-up with your pharmacy.

## 2018-09-08 NOTE — Telephone Encounter (Signed)
Patient called and said that she needs her muscle relaxer medication which she doesn't know that name off but also would like to speake with Drl Martinique or her CMA. Please call patient back, thanks.

## 2018-09-08 NOTE — Telephone Encounter (Signed)
She was supposed to follow for hypertension + other chronic medical problems in 08/2018. Thanks, BJ

## 2018-09-08 NOTE — Telephone Encounter (Signed)
Spoke with patient and she stated that she needed Zanaflex 4 mg. Last prescribed by Dr. Martinique 09/2016. Patient stated she is having a lot of neck pain and has ran out. She had some left over from before and that is the reason she hadn't asked for a refill.

## 2018-09-08 NOTE — Telephone Encounter (Signed)
Noted  

## 2018-09-09 ENCOUNTER — Other Ambulatory Visit: Payer: Self-pay

## 2018-09-09 ENCOUNTER — Encounter: Payer: Self-pay | Admitting: Family Medicine

## 2018-09-09 ENCOUNTER — Ambulatory Visit (INDEPENDENT_AMBULATORY_CARE_PROVIDER_SITE_OTHER): Payer: Medicaid Other | Admitting: Family Medicine

## 2018-09-09 VITALS — BP 139/89 | HR 84

## 2018-09-09 DIAGNOSIS — M542 Cervicalgia: Secondary | ICD-10-CM

## 2018-09-09 DIAGNOSIS — R51 Headache: Secondary | ICD-10-CM

## 2018-09-09 DIAGNOSIS — M792 Neuralgia and neuritis, unspecified: Secondary | ICD-10-CM | POA: Diagnosis not present

## 2018-09-09 DIAGNOSIS — R519 Headache, unspecified: Secondary | ICD-10-CM

## 2018-09-09 DIAGNOSIS — I10 Essential (primary) hypertension: Secondary | ICD-10-CM

## 2018-09-09 MED ORDER — TIZANIDINE HCL 4 MG PO TABS: 2.0000 mg | ORAL_TABLET | Freq: Four times a day (QID) | ORAL | 1 refills | Status: DC | PRN

## 2018-09-09 MED ORDER — AMLODIPINE BESY-BENAZEPRIL HCL 5-10 MG PO CAPS: 1.0000 | ORAL_CAPSULE | Freq: Every day | ORAL | 1 refills | Status: DC

## 2018-09-09 MED ORDER — HYDROCHLOROTHIAZIDE 25 MG PO TABS: 25.0000 mg | ORAL_TABLET | Freq: Every day | ORAL | 1 refills | Status: DC

## 2018-09-09 MED ORDER — GABAPENTIN 300 MG PO CAPS: 300.0000 mg | ORAL_CAPSULE | Freq: Three times a day (TID) | ORAL | 1 refills | Status: DC

## 2018-09-09 NOTE — Assessment & Plan Note (Signed)
Problem is better controlled. She will continue gabapentin 300 mg 3 times daily.

## 2018-09-09 NOTE — Assessment & Plan Note (Signed)
Most likely related with cervicalgia. Reported improvement with Zanaflex, so no changes in current management. We discussed some side effects of muscle relaxants.

## 2018-09-09 NOTE — Assessment & Plan Note (Addendum)
Improved. Continue Zanaflex 2-4 mg tid as needed. ROM exercises also may help. F/U in 6-9 months.

## 2018-09-09 NOTE — Assessment & Plan Note (Signed)
BP still not at goal. She will continue amlodipine 10 mg, benazepril 5 mg added.  Combination pill recommended, Lotrel to take at night. Continue low-salt diet. Continue monitoring BP regularly. We discussed possible complications of elevated BP. Follow-up in 3 months, before if needed.

## 2018-09-09 NOTE — Telephone Encounter (Signed)
Left message to return call to office to schedule virtual visit for follow-up for medication refills.

## 2018-09-09 NOTE — Telephone Encounter (Signed)
Patient scheduled follow-up for medication refills for 09/09/2018.

## 2018-09-09 NOTE — Progress Notes (Signed)
Virtual Visit via Video Note   I connected with [name] on 09/09/18 at  3:30 PM EDT by a video enabled telemedicine application and verified that I am speaking with the correct person using two identifiers.  Location patient: home Location provider:home office Persons participating in the virtual visit: patient, provider  I discussed the limitations of evaluation and management by telemedicine and the availability of in person appointments. The patient expressed understanding and agreed to proceed.   HPI: [name] is a 48 yo female with Hx of bipolar disorder,HTN,and chronic pain who today is following on some chronic problems. Since her last visit she has followed with Dr. Letta Pate for chronic pain management. She was last seen on 06/30/2018.  Hypertension, currently she is on amlodipine 10 mg daily. She has had some elevated BPs for a few days but now BPs are "good", 130's/80's. Denies worsening headaches, visual changes, chest pain, dyspnea, abdominal pain, nausea, vomiting, decreased urine output, gross hematuria, or new neurologic deficit.  Lab Results  Component Value Date   CREATININE 0.53 05/21/2018   BUN 11 05/21/2018   NA 140 05/21/2018   K 3.5 05/21/2018   CL 108 05/21/2018   CO2 25 05/21/2018   Last visit she was complaining about occipital headache problem has improved with Zanaflex 2 to 4 mg 3 times daily as needed. Zanaflex has also help with chronic neck pain.  She has tolerated medication well. She is also on tramadol and following with Dr.Kirstains for chronic pain management.  Lower back pain with radiculopathy, she is currently on gabapentin 300 mg 3 times daily, dose was increased last visit. Gabapentin has helped greatly with lower extremity burning sensation. She also underwent branch radiofrequency neurotomy on the fluoroscopic guidance on 07/28/2018. Negative for saddle anesthesia or bowel/urine incontinence.   ROS: See pertinent positives and  negatives per HPI.  Past Medical History:  Diagnosis Date  . Abnormal perimenopausal bleeding   . Cervical dysplasia   . Depression   . Head trauma   . Pregnancy induced hypertension   . Rheumatoid arthritis ([place])   . Seizure disorder ([place])   . Shingles   . STD (sexually transmitted disease)    Gonorrhea    Past Surgical History:  Procedure Laterality Date  . BICEPS TENDON REPAIR    . CESAREAN SECTION    . DILATATION & CURETTAGE/HYSTEROSCOPY WITH MYOSURE N/A 05/23/2018   Procedure: DILATATION & CURETTAGE/HYSTEROSCOPY;  Surgeon: Anastasio Auerbach, MD;  Location: [facility];  Service: Gynecology;  Laterality: N/A;  . SHOULDER SURGERY    . WISDOM TOOTH EXTRACTION      Family History  Problem Relation Age of Onset  . Cancer Maternal Grandfather        Lung    Social History   Socioeconomic History  . Marital status: Single    Spouse name: Not on file  . Number of children: Not on file  . Years of education: Not on file  . Highest education level: Not on file  Occupational History  . Not on file  Social Needs  . Financial resource strain: Not on file  . Food insecurity:    Worry: Not on file    Inability: Not on file  . Transportation needs:    Medical: Not on file    Non-medical: Not on file  Tobacco Use  . Smoking status: Former Smoker    Packs/day: 0.10    Years: 20.00    Pack years: 2.00  Start date: 08/31/2016  . Smokeless tobacco: Never Used  . Tobacco comment: quit 04-19-2018  Substance and Sexual Activity  . Alcohol use: Yes    Comment: occasionaly  . Drug use: Yes    Types: Marijuana    Comment: last use 05-19-2018  . Sexual activity: Yes    Comment: 1st intercourse 43 yrs-5 partners  Lifestyle  . Physical activity:    Days per week: Not on file    Minutes per session: Not on file  . Stress: Not on file  Relationships  . Social connections:    Talks on phone: Not on file    Gets together: Not on file    Attends religious  service: Not on file    Active member of club or organization: Not on file    Attends meetings of clubs or organizations: Not on file    Relationship status: Not on file  . Intimate partner violence:    Fear of current or ex partner: Not on file    Emotionally abused: Not on file    Physically abused: Not on file    Forced sexual activity: Not on file  Other Topics Concern  . Not on file  Social History Narrative  . Not on file    Current Outpatient Medications:  .  amLODipine-benazepril (LOTREL) 5-10 MG capsule, Take 1 capsule by mouth at bedtime., Disp: 90 capsule, Rfl: 1 .  aspirin 81 MG chewable tablet, Chew 81 mg by mouth daily as needed for mild pain., Disp: , Rfl:  .  gabapentin (NEURONTIN) 300 MG capsule, Take 1 capsule (300 mg total) by mouth 3 (three) times daily., Disp: 90 capsule, Rfl: 1 .  hydrochlorothiazide (HYDRODIURIL) 25 MG tablet, Take 1 tablet (25 mg total) by mouth daily., Disp: 90 tablet, Rfl: 1 .  Multiple Vitamins-Minerals (MULTIVITAMIN WITH MINERALS) tablet, Take 1 tablet by mouth daily., Disp: , Rfl:  .  nicotine (NICODERM CQ - DOSED IN MG/24 HOURS) 14 mg/24hr patch, Place 1 patch (14 mg total) onto the skin daily., Disp: 28 patch, Rfl: 1 .  QUEtiapine (SEROQUEL) 100 MG tablet, Take 100 mg by mouth at bedtime., Disp: , Rfl:  .  tiZANidine (ZANAFLEX) 4 MG tablet, Take 0.5-1 tablets (2-4 mg total) by mouth every 6 (six) hours as needed for muscle spasms., Disp: 90 tablet, Rfl: 1 .  traMADol (ULTRAM) 50 MG tablet, Take 1 tablet (50 mg total) by mouth 2 (two) times daily., Disp: 60 tablet, Rfl: 2 .  venlafaxine (EFFEXOR) 37.5 MG tablet, Take 37.5 mg by mouth once., Disp: , Rfl:   EXAM:  VITALS per patient if applicable:BP 024/09   Pulse 84   LMP 07/30/2016 (LMP Unknown)   GENERAL: alert, oriented, appears well and in no acute distress  HEENT: atraumatic, normocephalic,conjunctiva clear, no obvious facial abnormalities on inspection.  NECK: Limitation of ROM  of the neck  LUNGS: on inspection no signs of respiratory distress, breathing rate appears normal, no obvious gross SOB, gasping or wheezing  CV: no obvious cyanosis  MS: moves all visible extremities without noticeable abnormality  PSYCH/NEURO: pleasant and cooperative, no obvious depression or anxiety, speech and thought processing grossly intact  ASSESSMENT AND PLAN:  Discussed the following assessment and plan:  Orders Placed This Encounter  Procedures  . Basic metabolic panel    Hypertension, essential, benign BP still not at goal. She will continue amlodipine 10 mg, benazepril 5 mg added.  Combination pill recommended, Lotrel to take at night. Continue low-salt diet.  Continue monitoring BP regularly. We discussed possible complications of elevated BP. Follow-up in 3 months, before if needed.  Occipital headache Most likely related with cervicalgia. Reported improvement with Zanaflex, so no changes in current management. We discussed some side effects of muscle relaxants.   Neuropathic pain of lower extremity Problem is better controlled. She will continue gabapentin 300 mg 3 times daily.   Cervicalgia Improved. Continue Zanaflex 2-4 mg tid as needed. ROM exercises also may help. F/U in 6-9 months.     I discussed the assessment and treatment plan with the patient. She was provided an opportunity to ask questions and all were answered. The patient agreed with the plan and demonstrated an understanding of the instructions.     Return in about 3 months (around 12/10/2018) for HTN.    Cobi Aldape Martinique, MD

## 2018-09-19 ENCOUNTER — Other Ambulatory Visit: Payer: Medicaid Other

## 2018-09-22 ENCOUNTER — Other Ambulatory Visit: Payer: Self-pay

## 2018-09-22 ENCOUNTER — Ambulatory Visit (INDEPENDENT_AMBULATORY_CARE_PROVIDER_SITE_OTHER): Payer: Medicaid Other | Admitting: Neurology

## 2018-09-22 ENCOUNTER — Encounter: Payer: Self-pay | Admitting: Neurology

## 2018-09-22 VITALS — BP 134/82 | HR 68 | Ht 63.0 in | Wt 152.2 lb

## 2018-09-22 DIAGNOSIS — M47816 Spondylosis without myelopathy or radiculopathy, lumbar region: Secondary | ICD-10-CM | POA: Diagnosis not present

## 2018-09-22 DIAGNOSIS — M792 Neuralgia and neuritis, unspecified: Secondary | ICD-10-CM
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
## 2018-09-22 NOTE — Patient Instructions (Addendum)
MRI lumbar spine without spine.  We will call you with the results  Follow-up with pain management

## 2018-09-30 ENCOUNTER — Other Ambulatory Visit: Payer: Self-pay

## 2018-09-30 ENCOUNTER — Other Ambulatory Visit (INDEPENDENT_AMBULATORY_CARE_PROVIDER_SITE_OTHER): Payer: Medicaid Other

## 2018-09-30 DIAGNOSIS — I10 Essential (primary) hypertension: Secondary | ICD-10-CM

## 2018-09-30 LAB — BASIC METABOLIC PANEL
BUN: 13 mg/dL (ref 6–23)
CO2: 28 mEq/L (ref 19–32)
Calcium: 9.1 mg/dL (ref 8.4–10.5)
Chloride: 105 mEq/L (ref 96–112)
Creatinine, Ser: 0.69 mg/dL (ref 0.40–1.20)
GFR: 109.29 mL/min (ref >60.00)
Glucose, Bld: 121 mg/dL — ABNORMAL HIGH (ref 70–99)
Potassium: 4 mEq/L (ref 3.5–5.1)
Sodium: 140 mEq/L (ref 135–145)

## 2018-10-08 ENCOUNTER — Other Ambulatory Visit: Payer: Medicaid Other

## 2018-10-08 ENCOUNTER — Ambulatory Visit: Payer: Medicaid Other | Admitting: Gynecology

## 2018-10-08 DIAGNOSIS — Z0289 Encounter for other administrative examinations: Secondary | ICD-10-CM

## 2018-10-16 ENCOUNTER — Other Ambulatory Visit: Payer: Medicaid Other

## 2018-11-03 ENCOUNTER — Encounter: Payer: Medicaid Other | Admitting: Family Medicine

## 2018-12-02 ENCOUNTER — Ambulatory Visit: Payer: Medicaid Other | Admitting: Physical Medicine & Rehabilitation

## 2018-12-16 ENCOUNTER — Other Ambulatory Visit: Payer: Self-pay | Admitting: Family Medicine

## 2018-12-16 DIAGNOSIS — F17200 Nicotine dependence, unspecified, uncomplicated: Secondary | ICD-10-CM

## 2018-12-16 NOTE — Telephone Encounter (Signed)
Medication Refill - Medication: nicotine (NICODERM CQ - DOSED IN MG/24 HOURS) 14 mg/24hr patch    Has the patient contacted their pharmacy? Yes.   Pt states she has no more of these patches. Please advise.  (Agent: If no, request that the patient contact the pharmacy for the refill.) (Agent: If yes, when and what did the pharmacy advise?)  Preferred Pharmacy (with phone number or street name):  [facility] [phone] - [address], [address]  [place] [address]  Phone: [phone] Fax: [phone]  Not a 24 hour pharmacy; exact hours not known.     Agent: Please be advised that RX refills may take up to 3 business days. We ask that you follow-up with your pharmacy.

## 2018-12-16 NOTE — Telephone Encounter (Signed)
Requested medication (s) are due for refill today: yes  Requested medication (s) are on the active medication list: yes  Last refill:  10/2017  Future visit scheduled: no  Notes to clinic:  Patient is out of patches Review for refill   Requested Prescriptions  Pending Prescriptions Disp Refills   nicotine (NICODERM CQ - DOSED IN MG/24 HOURS) 14 mg/24hr patch 28 patch 1    Sig: Place 1 patch (14 mg total) onto the skin daily.     Psychiatry:  Drug Dependence Therapy Passed - 12/16/2018 11:16 AM      Passed - Valid encounter within last 12 months    Recent Outpatient Visits          3 months ago Hypertension, essential, benign   Therapist, [facility] at [facility], Malka So, MD   5 months ago Hypertension, essential, benign   Therap[facility] at [facility], Malka So, MD   9 months ago DUB (dysfunctional uterine bleeding)   Therapist[facility] at [facility], Malka So, MD   1 year ago Hypertension, essential, benign   Therap[facility] at [facility], Malka So, MD   1 year ago Bipolar 1 disorder, depressed ([place])   [facility], Malka So, MD

## 2018-12-17 MED ORDER — NICOTINE 14 MG/24HR TD PT24: 14.0000 mg | MEDICATED_PATCH | Freq: Every day | TRANSDERMAL | 1 refills | Status: DC

## 2018-12-24 ENCOUNTER — Encounter: Payer: Self-pay | Admitting: Gynecology

## 2019-01-20 ENCOUNTER — Encounter: Payer: Self-pay | Admitting: Family Medicine

## 2019-01-20 ENCOUNTER — Telehealth (INDEPENDENT_AMBULATORY_CARE_PROVIDER_SITE_OTHER): Payer: Medicare Other | Admitting: Family Medicine

## 2019-01-20 ENCOUNTER — Other Ambulatory Visit: Payer: Self-pay

## 2019-01-20 VITALS — Ht 63.0 in

## 2019-01-20 DIAGNOSIS — N644 Mastodynia: Secondary | ICD-10-CM | POA: Diagnosis not present

## 2019-01-20 DIAGNOSIS — I10 Essential (primary) hypertension: Secondary | ICD-10-CM

## 2019-01-20 DIAGNOSIS — M542 Cervicalgia: Secondary | ICD-10-CM | POA: Diagnosis not present

## 2019-01-20 DIAGNOSIS — M792 Neuralgia and neuritis, unspecified: Secondary | ICD-10-CM | POA: Diagnosis not present

## 2019-01-20 DIAGNOSIS — F17211 Nicotine dependence, cigarettes, in remission: Secondary | ICD-10-CM

## 2019-01-20 MED ORDER — NICOTINE 14 MG/24HR TD PT24: 14.0000 mg | MEDICATED_PATCH | Freq: Every day | TRANSDERMAL | 1 refills | Status: DC

## 2019-01-20 MED ORDER — HYDROCHLOROTHIAZIDE 25 MG PO TABS: 25.0000 mg | ORAL_TABLET | Freq: Every day | ORAL | 2 refills | Status: DC

## 2019-01-20 MED ORDER — GABAPENTIN 300 MG PO CAPS: 300.0000 mg | ORAL_CAPSULE | Freq: Three times a day (TID) | ORAL | 2 refills | Status: DC

## 2019-01-20 MED ORDER — AMLODIPINE BESY-BENAZEPRIL HCL 5-10 MG PO CAPS: 1.0000 | ORAL_CAPSULE | Freq: Every day | ORAL | 2 refills | Status: DC

## 2019-01-20 MED ORDER — TIZANIDINE HCL 4 MG PO CAPS: 4.0000 mg | ORAL_CAPSULE | Freq: Two times a day (BID) | ORAL | 2 refills | Status: DC | PRN

## 2019-01-20 NOTE — Progress Notes (Signed)
Virtual Visit via Video Note   I connected with Patricia [name] 01/20/19 by a video enabled telemedicine application and verified that I am speaking with the correct person using two identifiers.  Location patient: home Location provider:work or home office Persons participating in the virtual visit: patient, provider  I discussed the limitations of evaluation and management by telemedicine and the availability of in person appointments. The patient expressed understanding and agreed to proceed.   HPI: Patricia [name] is a 48 year old female with history of chronic pain, hypertension, and bipolar disorder who is complaining about 2 to 3 weeks of left upper breast tenderness, "nagging" pain, intermittent, 4/10. She has not identified exacerbating or alleviating factors. No history of trauma. She has not noted breast masses, skin changes, or nipple discharge.  Mammogram that was ordered a few months ago was not done.  She has not tried OTC medications. Problem is stable.  Postmenopausal.  Hypertension: Currently she is on amlodipine-benazepril 5-10 mg daily and HCTZ 25 mg daily. She denies checking BP. She states that she has "not been eating right."  Denies severe/frequent headache, visual changes, chest pain, dyspnea, palpitation, claudication, focal weakness, or edema.  Lab Results  Component Value Date   CREATININE 0.69 09/30/2018   BUN 13 09/30/2018   NA 140 09/30/2018   K 4.0 09/30/2018   CL 105 09/30/2018   CO2 28 09/30/2018    Neuropathy pain: LE, bilateral paresthesia. She is on gabapentin 300 mg 3 times daily. She states the medication is helping. She has not noticed side effects.  Last seen by neurologist, Dr Patel,09/09/18.  Chronic cervical pain and occipital headache, she is on tizanidine 4 mg, which she is taking twice daily as needed.  She follows with chronic pain management, currently she is on tramadol 50 mg twice daily.  She is still following with her  psychiatrist regularly.  She is also requesting refills for nicotine 14 mg / 24 hours patch. Former smoker.   ROS: See pertinent positives and negatives per HPI.  Past Medical History:  Diagnosis Date  . Abnormal perimenopausal bleeding   . Cervical dysplasia   . Depression   . Head trauma   . Pregnancy induced hypertension   . Rheumatoid arthritis ([place])   . Seizure disorder ([place])   . Shingles   . STD (sexually transmitted disease)    Gonorrhea    Past Surgical History:  Procedure Laterality Date  . BICEPS TENDON REPAIR    . CESAREAN SECTION    . DILATATION & CURETTAGE/HYSTEROSCOPY WITH MYOSURE N/A 05/23/2018   Procedure: DILATATION & CURETTAGE/HYSTEROSCOPY;  Surgeon: Anastasio Auerbach, MD;  Location: [facility];  Service: Gynecology;  Laterality: N/A;  . SHOULDER SURGERY    . WISDOM TOOTH EXTRACTION      Family History  Problem Relation Age of Onset  . Cancer Maternal Grandfather        Lung    Social History   Socioeconomic History  . Marital status: Single    Spouse name: Not on file  . Number of children: Not on file  . Years of education: Not on file  . Highest education level: Not on file  Occupational History  . Not on file  Social Needs  . Financial resource strain: Not on file  . Food insecurity    Worry: Not on file    Inability: Not on file  . Transportation needs    Medical: Not on file    Non-medical: Not on file  Tobacco Use  . Smoking status: Former Smoker    Packs/day: 0.10    Years: 20.00    Pack years: 2.00    Start date: 08/31/2016  . Smokeless tobacco: Never Used  . Tobacco comment: quit 04-19-2018  Substance and Sexual Activity  . Alcohol use: Yes    Comment: occasionaly  . Drug use: Yes    Types: Marijuana    Comment: last use 05-19-2018  . Sexual activity: Yes    Comment: 1st intercourse 46 yrs-5 partners  Lifestyle  . Physical activity    Days per week: Not on file    Minutes per session: Not on file  .  Stress: Not on file  Relationships  . Social Herbalist on phone: Not on file    Gets together: Not on file    Attends religious service: Not on file    Active member of club or organization: Not on file    Attends meetings of clubs or organizations: Not on file    Relationship status: Not on file  . Intimate partner violence    Fear of current or ex partner: Not on file    Emotionally abused: Not on file    Physically abused: Not on file    Forced sexual activity: Not on file  Other Topics Concern  . Not on file  Social History Narrative   Lives with Rosalee Kaufman, daughter and grandson      Lives in a two story home.      Right handed      Highest level of education- 12 grade      Disabled         Current Outpatient Medications:  .  amLODipine-benazepril (LOTREL) 5-10 MG capsule, Take 1 capsule by mouth at bedtime., Disp: 90 capsule, Rfl: 2 .  gabapentin (NEURONTIN) 300 MG capsule, Take 1 capsule (300 mg total) by mouth 3 (three) times daily., Disp: 90 capsule, Rfl: 2 .  hydrochlorothiazide (HYDRODIURIL) 25 MG tablet, Take 1 tablet (25 mg total) by mouth daily., Disp: 90 tablet, Rfl: 2 .  Multiple Vitamins-Minerals (MULTIVITAMIN WITH MINERALS) tablet, Take 1 tablet by mouth daily., Disp: , Rfl:  .  nicotine (NICODERM CQ - DOSED IN MG/24 HOURS) 14 mg/24hr patch, Place 1 patch (14 mg total) onto the skin daily., Disp: 28 patch, Rfl: 1 .  QUEtiapine (SEROQUEL) 100 MG tablet, Take 100 mg by mouth at bedtime., Disp: , Rfl:  .  tiZANidine (ZANAFLEX) 4 MG capsule, Take 1 capsule (4 mg total) by mouth 2 (two) times daily as needed for muscle spasms., Disp: 60 capsule, Rfl: 2 .  traMADol (ULTRAM) 50 MG tablet, Take 1 tablet (50 mg total) by mouth 2 (two) times daily., Disp: 60 tablet, Rfl: 2 .  venlafaxine (EFFEXOR) 37.5 MG tablet, Take 37.5 mg by mouth once., Disp: , Rfl:   EXAM:  VITALS per patient if applicable:  GENERAL: alert, oriented, appears well and in no acute  distress  HEENT: atraumatic, conjunctiva clear, no obvious abnormalities on inspection.  NECK: Limitation of ROM.  LUNGS: on inspection no signs of respiratory distress, breathing rate appears normal, no obvious gross SOB, gasping or wheezing  CV: no obvious cyanosis  MS: moves all visible extremities without noticeable abnormality  PSYCH/NEURO: pleasant and cooperative, no obvious depression or anxiety, speech and thought processing grossly intact  ASSESSMENT AND PLAN:  Discussed the following assessment and plan:  Breast tenderness in female - Plan: MS DIGITAL DIAG TOMO BILAT Dx  mammogram will be arranged.  Neuropathic pain of lower extremity, unspecified laterality - Plan: gabapentin (NEURONTIN) 300 MG capsule Problem seems to be well controlled with Gabapentin. No changes in current management. Appropriate skin care.  Cervicalgia - Plan: gabapentin (NEURONTIN) 300 MG capsule, tiZANidine (ZANAFLEX) 4 MG capsule Stable. Zanaflex is helping, so continue 4 mg bid. Side effects discussed.  Hypertension, essential, benign - Plan: hydrochlorothiazide (HYDRODIURIL) 25 MG tablet Instructed to monitor BP regularly. No changes in current management. Low salt diet recommended. F/U in 3-4 months.  Nicotine dependence, cigarettes, in remission - Plan: nicotine (NICODERM CQ - DOSED IN MG/24 HOURS) 14 mg/24hr patch Adverse effects of tobacco use and benefits of smoking cessation. Continue Nicotine patches 14 mg/24 hours, next Rx will be 7 mg.    I discussed the assessment and treatment plan with the patient. She was provided an opportunity to ask questions and all were answered. She agreed with the plan and demonstrated an understanding of the instructions.   The patient was advised to call back or seek an in-person evaluation if the symptoms worsen or if the condition fails to improve as anticipated.  Return in about 4 months (around 05/23/2019) for HTN and neuropathic  pain.    Tamar Lipscomb [place], MD

## 2019-01-22 ENCOUNTER — Other Ambulatory Visit: Payer: Self-pay | Admitting: Family Medicine

## 2019-01-22 DIAGNOSIS — N644 Mastodynia: Secondary | ICD-10-CM

## 2019-01-27 ENCOUNTER — Encounter: Payer: Medicare Other | Admitting: Physical Medicine & Rehabilitation

## 2019-01-28 ENCOUNTER — Other Ambulatory Visit: Payer: Self-pay

## 2019-01-28 ENCOUNTER — Encounter: Payer: Self-pay | Admitting: Physical Medicine & Rehabilitation

## 2019-01-28 ENCOUNTER — Encounter: Payer: Medicare Other | Attending: Physical Medicine & Rehabilitation | Admitting: Physical Medicine & Rehabilitation

## 2019-01-28 VITALS — BP 129/83 | HR 77 | Temp 97.2°F | Ht 65.0 in | Wt 147.0 lb

## 2019-01-28 DIAGNOSIS — M47816 Spondylosis without myelopathy or radiculopathy, lumbar region: Secondary | ICD-10-CM

## 2019-01-28 MED ORDER — TRAMADOL HCL 50 MG PO TABS: 50.0000 mg | ORAL_TABLET | Freq: Two times a day (BID) | ORAL | 2 refills | Status: DC

## 2019-01-28 NOTE — Progress Notes (Signed)
Subjective:    Patient ID: Patricia [name], female    DOB: Apr 29, 1970, 48 y.o.   MRN: [id number]  HPI  48 year old female with lumbar spondylosis.  She has had good relief of pain with lumbar medial branch blocks L3 and L4 on the right side as well as L5 dorsal ramus injection under fluoroscopic guidance.  The patient did have radiofrequency neurotomy of the same nerves performed in April 2020.  The patient states she had good pain relief for a couple months but then her pain returned.  Takes Tramadol twice daily although given her prescription and refills she is actually taking this less than twice daily   PCP Dr [place] prescribes Gabapentin 300mg  TID and Tizanidine 4mg  BID  Pt remains independent with self care and mobility Pain Inventory Average Pain 8 Pain Right Now 6 My pain is burning, stabbing, tingling and aching  In the last 24 hours, has pain interfered with the following? General activity 10 Relation with others 10 Enjoyment of life 10 What TIME of day is your pain at its worst? daytime, evening, night  Sleep (in general) Poor  Pain is worse with: walking, bending, sitting and standing Pain improves with: therapy/exercise and pacing activities Relief from Meds: 8  Mobility walk without assistance  Function disabled: date disabled .  Neuro/Psych tingling trouble walking spasms depression anxiety  Prior Studies Any changes since last visit?  no  Physicians involved in your care Any changes since last visit?  no   Family History  Problem Relation Age of Onset  . Cancer Maternal Grandfather        Lung   Social History   Socioeconomic History  . Marital status: Single    Spouse name: Not on file  . Number of children: Not on file  . Years of education: Not on file  . Highest education level: Not on file  Occupational History  . Not on file  Social Needs  . Financial resource strain: Not on file  . Food insecurity    Worry: Not on file   Inability: Not on file  . Transportation needs    Medical: Not on file    Non-medical: Not on file  Tobacco Use  . Smoking status: Former Smoker    Packs/day: 0.10    Years: 20.00    Pack years: 2.00    Start date: 08/31/2016  . Smokeless tobacco: Never Used  . Tobacco comment: quit 04-19-2018  Substance and Sexual Activity  . Alcohol use: Yes    Comment: occasionaly  . Drug use: Yes    Types: Marijuana    Comment: last use 05-19-2018  . Sexual activity: Yes    Comment: 1st intercourse 25 yrs-5 partners  Lifestyle  . Physical activity    Days per week: Not on file    Minutes per session: Not on file  . Stress: Not on file  Relationships  . Social Herbalist on phone: Not on file    Gets together: Not on file    Attends religious service: Not on file    Active member of club or organization: Not on file    Attends meetings of clubs or organizations: Not on file    Relationship status: Not on file  Other Topics Concern  . Not on file  Social History Narrative   Lives with Rosalee Kaufman, daughter and grandson      Lives in a two story home.      Right handed  Highest level of education- 12 grade      Disabled      Past Surgical History:  Procedure Laterality Date  . BICEPS TENDON REPAIR    . CESAREAN SECTION    . DILATATION & CURETTAGE/HYSTEROSCOPY WITH MYOSURE N/A 05/23/2018   Procedure: DILATATION & CURETTAGE/HYSTEROSCOPY;  Surgeon: Anastasio Auerbach, MD;  Location: [facility];  Service: Gynecology;  Laterality: N/A;  . SHOULDER SURGERY    . WISDOM TOOTH EXTRACTION     Past Medical History:  Diagnosis Date  . Abnormal perimenopausal bleeding   . Cervical dysplasia   . Depression   . Head trauma   . Pregnancy induced hypertension   . Rheumatoid arthritis ([place])   . Seizure disorder ([place])   . Shingles   . STD (sexually transmitted disease)    Gonorrhea   BP 129/83   Pulse 77   Temp (!) 97.2 F (36.2 C)   Ht 5\' 5"  (1.651 m)   Wt  147 lb (66.7 kg)   LMP 07/30/2016 (LMP Unknown)   SpO2 98%   BMI 24.46 kg/m   Opioid Risk Score:   Fall Risk Score:  `1  Depression screen PHQ 2/9  Depression screen [facility] 2/9 06/02/2018 09/30/2017 08/09/2017  Decreased Interest 0 1 0  Down, Depressed, Hopeless 0 1 0  PHQ - 2 Score 0 2 0    Review of Systems  Constitutional: Positive for unexpected weight change.  HENT: Negative.   Eyes: Negative.   Respiratory: Negative.   Cardiovascular: Negative.   Gastrointestinal: Negative.   Endocrine: Negative.   Genitourinary: Negative.   Musculoskeletal: Positive for arthralgias, back pain and gait problem.       Spasms   Skin: Negative.   Allergic/Immunologic: Negative.   Neurological:       Tingling  Psychiatric/Behavioral: Positive for dysphoric mood. The patient is nervous/anxious.   All other systems reviewed and are negative.      Objective:   Physical Exam Vitals signs and nursing note reviewed.  Constitutional:      Appearance: Normal appearance.  HENT:     Head: Normocephalic and atraumatic.  Musculoskeletal:     Lumbar back: She exhibits decreased range of motion and tenderness. She exhibits no deformity.  Neurological:     General: No focal deficit present.     Mental Status: She is alert and oriented to person, place, and time.     Sensory: Sensation is intact.     Gait: Gait is intact.     Deep Tendon Reflexes:     Reflex Scores:      Patellar reflexes are 1+ on the right side and 1+ on the left side.      Achilles reflexes are 1+ on the right side and 1+ on the left side.    Comments: 5/5 in bilateral HF, KE, ADF Neg SLR  Psychiatric:        Mood and Affect: Mood normal.        Behavior: Behavior normal.           Assessment & Plan:  1.  Lumbar spondylosis without myelopathy, had only 8wks relief with RF RIght L3-4-5 Discussed treatment options Referral to PT for core stabilization RTC 1 mo to re eval pain and function, may consider Cooled RF as  option

## 2019-01-28 NOTE — Patient Instructions (Signed)

## 2019-01-30 ENCOUNTER — Ambulatory Visit
Admission: RE | Admit: 2019-01-30 | Discharge: 2019-01-30 | Disposition: A | Discharge status: Home / Self Care | Payer: Medicare Other | Source: Ambulatory Visit | Attending: Family Medicine | Admitting: Family Medicine

## 2019-01-30 ENCOUNTER — Other Ambulatory Visit: Payer: Self-pay

## 2019-01-30 ENCOUNTER — Ambulatory Visit: Payer: Medicare Other

## 2019-01-30 DIAGNOSIS — N644 Mastodynia: Secondary | ICD-10-CM

## 2019-02-10 ENCOUNTER — Telehealth: Payer: Self-pay | Admitting: Family Medicine

## 2019-02-10 NOTE — Telephone Encounter (Signed)
Pt called in to make provider aware that her insurance (Medicaid) doesn't cover tiZANidine (ZANAFLEX) 4 MG capsule , pt would like to know if PCP could prescribe something different?

## 2019-02-10 NOTE — Telephone Encounter (Signed)
Please advise 

## 2019-02-16 ENCOUNTER — Other Ambulatory Visit: Payer: Self-pay | Admitting: Family Medicine

## 2019-02-16 MED ORDER — BACLOFEN 10 MG PO TABS: 10.0000 mg | ORAL_TABLET | Freq: Two times a day (BID) | ORAL | 2 refills | Status: DC | PRN

## 2019-02-16 NOTE — Telephone Encounter (Signed)
Prescription for baclofen 10 mg sent to her pharmacy to try 1 tablet twice daily as needed.  Similar side effects that Zanaflex in regard to drowsiness. Thanks, BJ

## 2019-02-16 NOTE — Telephone Encounter (Signed)
Spoke with patient. She expressed understanding. Nothing further needed.

## 2019-02-18 ENCOUNTER — Ambulatory Visit: Payer: Medicare Other | Admitting: Physical Therapy

## 2019-02-24 ENCOUNTER — Encounter: Payer: Self-pay | Admitting: Physical Medicine & Rehabilitation

## 2019-02-24 ENCOUNTER — Encounter: Payer: Medicare Other | Attending: Physical Medicine & Rehabilitation | Admitting: Physical Medicine & Rehabilitation

## 2019-02-24 ENCOUNTER — Telehealth: Payer: Self-pay | Admitting: *Deleted

## 2019-02-24 ENCOUNTER — Other Ambulatory Visit: Payer: Self-pay

## 2019-02-24 VITALS — BP 127/84 | HR 71 | Temp 97.7°F | Ht 62.0 in | Wt 146.0 lb

## 2019-02-24 DIAGNOSIS — Z79891 Long term (current) use of opiate analgesic: Secondary | ICD-10-CM

## 2019-02-24 DIAGNOSIS — G8929 Other chronic pain: Secondary | ICD-10-CM | POA: Diagnosis present

## 2019-02-24 DIAGNOSIS — M25551 Pain in right hip: Secondary | ICD-10-CM | POA: Diagnosis present

## 2019-02-24 DIAGNOSIS — G894 Chronic pain syndrome: Secondary | ICD-10-CM | POA: Diagnosis present

## 2019-02-24 DIAGNOSIS — Z5181 Encounter for therapeutic drug level monitoring: Secondary | ICD-10-CM

## 2019-02-24 DIAGNOSIS — M47816 Spondylosis without myelopathy or radiculopathy, lumbar region: Secondary | ICD-10-CM | POA: Insufficient documentation

## 2019-02-24 NOTE — Telephone Encounter (Signed)
Patricia [name] called and reports she was supposed to do a drug screen and forgot to go to lab and needs to know if she can come first thing in the morning. I told her that she has 24 hours and tomorrow am will be ok. She says she will be here first thing in the am.
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
## 2019-02-24 NOTE — Patient Instructions (Signed)
If hip xray looks like arthritis will call you in for injection

## 2019-03-01 LAB — TOXASSURE SELECT,+ANTIDEPR,UR

## 2019-03-03 ENCOUNTER — Telehealth: Payer: Self-pay | Admitting: *Deleted

## 2019-03-03 NOTE — Telephone Encounter (Signed)
This is the one that I saw yesterday I must have clicked on the other patients name. We will cancel the remaining tramadol prescription for Patricia [name] due to violation of controlled substance agreement

## 2019-03-03 NOTE — Telephone Encounter (Signed)
Remainder of Tramadol canceled with [facility]. Ms Endlich notified.

## 2019-03-03 NOTE — Telephone Encounter (Signed)
Urine drug screen was positive for Tramadol but was also positive for THC.

## 2019-03-03 NOTE — Addendum Note (Signed)
Addended by: Caro Hight on: 03/03/2019 03:46 PM   Modules accepted: Orders

## 2019-03-13 ENCOUNTER — Telehealth: Payer: Self-pay | Admitting: *Deleted

## 2019-03-13 DIAGNOSIS — G894 Chronic pain syndrome: Secondary | ICD-10-CM

## 2019-03-13 NOTE — Telephone Encounter (Signed)
Copied from [place] [id number]. Topic: General - Other >> Mar 12, 2019  1:40 PM Wynetta Emery, [place] C wrote: Reason for CRM: pt is requesting a referral sent to pain management:  [phone] - fax Phone: [phone] - pt isn't sure of the name of the location

## 2019-03-13 NOTE — Telephone Encounter (Signed)
Okay for referral?

## 2019-03-16 NOTE — Telephone Encounter (Signed)
It is ok to place referral as requested. Thanks, BJ 

## 2019-03-16 NOTE — Telephone Encounter (Signed)
Referral entered per pt request, they will contact her to schedule.

## 2019-03-16 NOTE — Addendum Note (Signed)
Addended by: Rodrigo Ran on: 03/16/2019 10:46 AM   Modules accepted: Orders

## 2019-05-12 ENCOUNTER — Encounter: Payer: Self-pay | Admitting: Family Medicine

## 2019-05-12 ENCOUNTER — Telehealth: Payer: Medicare HMO | Admitting: Family Medicine

## 2019-05-12 DIAGNOSIS — Z79891 Long term (current) use of opiate analgesic: Secondary | ICD-10-CM | POA: Diagnosis not present

## 2019-05-12 DIAGNOSIS — G8929 Other chronic pain: Secondary | ICD-10-CM | POA: Diagnosis not present

## 2019-05-12 DIAGNOSIS — M25512 Pain in left shoulder: Secondary | ICD-10-CM | POA: Diagnosis not present

## 2019-05-12 DIAGNOSIS — M25511 Pain in right shoulder: Secondary | ICD-10-CM | POA: Diagnosis not present

## 2019-05-12 DIAGNOSIS — M5412 Radiculopathy, cervical region: Secondary | ICD-10-CM | POA: Diagnosis not present

## 2019-05-12 DIAGNOSIS — M542 Cervicalgia: Secondary | ICD-10-CM | POA: Diagnosis not present

## 2019-05-12 DIAGNOSIS — F1120 Opioid dependence, uncomplicated: Secondary | ICD-10-CM | POA: Diagnosis not present

## 2019-05-12 DIAGNOSIS — G894 Chronic pain syndrome: Secondary | ICD-10-CM | POA: Diagnosis not present

## 2019-05-13 ENCOUNTER — Encounter: Payer: Self-pay | Admitting: Family Medicine

## 2019-05-13 ENCOUNTER — Telehealth (INDEPENDENT_AMBULATORY_CARE_PROVIDER_SITE_OTHER): Payer: Medicare HMO | Admitting: Family Medicine

## 2019-05-13 VITALS — BP 125/85 | Ht 62.0 in

## 2019-05-13 DIAGNOSIS — F319 Bipolar disorder, unspecified: Secondary | ICD-10-CM | POA: Diagnosis not present

## 2019-05-13 DIAGNOSIS — M79601 Pain in right arm: Secondary | ICD-10-CM | POA: Diagnosis not present

## 2019-05-13 DIAGNOSIS — I10 Essential (primary) hypertension: Secondary | ICD-10-CM | POA: Diagnosis not present

## 2019-05-13 MED ORDER — BACLOFEN 10 MG PO TABS: 10.0000 mg | ORAL_TABLET | Freq: Every day | ORAL | 2 refills | Status: DC | PRN

## 2019-05-13 NOTE — Progress Notes (Signed)
Virtual Visit via Telephone Note  I connected with Patricia [name] on 05/13/19 at  3:30 PM EST by telephone and verified that I am speaking with the correct person using two identifiers.   I discussed the limitations, risks, security and privacy concerns of performing an evaluation and management service by telephone and the availability of in person appointments. I also discussed with the patient that there may be a patient responsible charge related to this service. The patient expressed understanding and agreed to proceed.  Location patient: home Location provider: work office Participants present for the call: patient, provider Patient did not have a visit in the prior 7 days to address this/these issue(s).  Chief Complaint  Patient presents with  . Arm Pain    History of Present Illness: Patricia [name] is a 49 year old female with history of chronic pain, hypertension, and bipolar disorder who is C/O a "few weeks" of right UE pain, pain was"bad",+ limitation of ROM. Pain extends from shoulder to above elbow. Constant, stabbing-like pain that was interfering with sleep.  She has not noted numbness, tingling, or burning. She has history of left upper extremity radicular pain and neck pain.  Yesterday she had a follow-up with pain management, she was started on Lyrica 25 mg, she took 2-3 caps and has noted great improvement.  She is also taking oral steroid, methylprednisolone. She is on gabapentin 300 mg 3 times daily.  Now pain is 6/10 and she is able to move extremity. She has not noted erythema or edema of affected area.  Negative for fever, chills, unusual fatigue, CP, dyspnea, or palpitations.  Next follow-up appointment with pain management is on 05/20/19. Hypertension: Currently she is on HCTZ 25 mg daily and amlodipine-benazepril 5-10 mg. -Days ago she had mild headache, resolved after Zyrtec and eating something. Negative for visual changes, abdominal pain, nausea,  vomiting, gross hematuria, decreased urine output, or erythema. BP readings: 120s/80s.  Lab Results  Component Value Date   CREATININE 0.69 09/30/2018   BUN 13 09/30/2018   NA 140 09/30/2018   K 4.0 09/30/2018   CL 105 09/30/2018   CO2 28 09/30/2018   Bipolar disorder/depression: She has not followed with psychiatrist in a while. She is still taking Seroquel 100 mg daily and Effexor  XR 37.5 mg daily. She is reporting symptoms as well controlled. She is getting medication from her psychiatrist and according to patient, she can follow if needed.  Observations/Objective: Patient sounds cheerful and well on the phone. I do not appreciate any SOB. Speech and thought processing are grossly intact. Patient reported vitals:  Assessment and Plan:  1. Pain of right upper extremity She is reporting improvement. History suggest radicular pain. She is also on gabapentin and according to patient, she was just started on Lyrica; so recommend decreasing dose of gabapentin from 300 mg 3 times daily to twice daily. We discussed some side effects of medications, including methylprednisolone. May need cervical MRI if problem does not resolve in 4-6 weeks. Instructed about warning signs.  2. Hypertension, essential, benign BP seems to be adequately controlled based on reported readings. No changes in current management. Continue low-salt diet.  - Comprehensive metabolic panel; Future  3. Bipolar 1 disorder, depressed ([place]) She reporting problem as well controlled. Encouraged to continue medication and follow-ups with psychiatrist.   Follow Up Instructions:  Return in about 6 months (around 11/10/2019) for Labs on 05/15/19.  I did not refer this patient for an OV in the next 24 hours  for this/these issue(s).  I discussed the assessment and treatment plan with the patient.Patricia [name] was provided an opportunity to ask questions and all were answered. She  agreed with the plan and  demonstrated an understanding of the instructions.    I provided 12 minutes of non-face-to-face time during this encounter.   Lejuan Botto Martinique, MD

## 2019-05-14 ENCOUNTER — Other Ambulatory Visit: Payer: Self-pay

## 2019-05-14 NOTE — Progress Notes (Signed)
Patient is a scheduled to follow up with Dr. Martinique on 11/10/2019 at 10 AM

## 2019-05-15 ENCOUNTER — Other Ambulatory Visit: Payer: Medicare HMO

## 2019-05-19 ENCOUNTER — Other Ambulatory Visit: Payer: Self-pay

## 2019-05-20 ENCOUNTER — Other Ambulatory Visit: Payer: Self-pay | Admitting: Family Medicine

## 2019-05-20 ENCOUNTER — Other Ambulatory Visit (INDEPENDENT_AMBULATORY_CARE_PROVIDER_SITE_OTHER): Payer: Medicare HMO

## 2019-05-20 ENCOUNTER — Telehealth: Payer: Self-pay | Admitting: Family Medicine

## 2019-05-20 DIAGNOSIS — K219 Gastro-esophageal reflux disease without esophagitis: Secondary | ICD-10-CM

## 2019-05-20 DIAGNOSIS — I10 Essential (primary) hypertension: Secondary | ICD-10-CM

## 2019-05-20 LAB — COMPREHENSIVE METABOLIC PANEL
ALT: 19 U/L (ref 0–35)
AST: 13 U/L (ref 0–37)
Albumin: 4 g/dL (ref 3.5–5.2)
Alkaline Phosphatase: 86 U/L (ref 39–117)
BUN: 14 mg/dL (ref 6–23)
CO2: 32 mEq/L (ref 19–32)
Calcium: 9.6 mg/dL (ref 8.4–10.5)
Chloride: 105 mEq/L (ref 96–112)
Creatinine, Ser: 0.56 mg/dL (ref 0.40–1.20)
GFR: 138.7 mL/min (ref >60.00)
Glucose, Bld: 79 mg/dL (ref 70–99)
Potassium: 4 mEq/L (ref 3.5–5.1)
Sodium: 142 mEq/L (ref 135–145)
Total Bilirubin: 0.4 mg/dL (ref 0.2–1.2)
Total Protein: 6.5 g/dL (ref 6.0–8.3)

## 2019-05-20 MED ORDER — OMEPRAZOLE 20 MG PO CPDR: 20.0000 mg | DELAYED_RELEASE_CAPSULE | Freq: Every day | ORAL | 3 refills | Status: DC

## 2019-05-20 NOTE — Telephone Encounter (Signed)
Prescription for omeprazole 20 mg was sent to her pharmacy to take 30-minute before breakfast for 6 to 8 weeks then as needed. The main treatment of acid reflux is to avoid food that aggravate symptoms. Thanks, BJ

## 2019-05-20 NOTE — Telephone Encounter (Signed)
Pt is in the office today wanting to see if she can get something for acid reflux and state that her acid reflux from eating fried foods,pizza, spaghetti, chocolate and orange juice.  Pharm:  [facility] on [address].

## 2019-05-20 NOTE — Telephone Encounter (Signed)
Please advise 

## 2019-05-22 NOTE — Telephone Encounter (Signed)
Pt aware Rx was sent in & that her recent lab draw was normal.

## 2019-05-27 DIAGNOSIS — F250 Schizoaffective disorder, bipolar type: Secondary | ICD-10-CM | POA: Diagnosis not present

## 2019-06-01 ENCOUNTER — Telehealth: Payer: Self-pay | Admitting: Family Medicine

## 2019-06-01 NOTE — Telephone Encounter (Signed)
Pt said that muscle relaxer are no longer working. She can still feel pain after taking them. She would like to see if she could up the dosage.   If able to prescribe the correct pharmacy will be [facility].

## 2019-06-05 ENCOUNTER — Other Ambulatory Visit: Payer: Self-pay

## 2019-06-05 NOTE — Telephone Encounter (Signed)
Pt has appt Monday morning at 7am.

## 2019-06-08 ENCOUNTER — Ambulatory Visit: Payer: Medicare HMO | Admitting: Family Medicine

## 2019-06-08 ENCOUNTER — Other Ambulatory Visit: Payer: Self-pay

## 2019-06-09 ENCOUNTER — Ambulatory Visit (INDEPENDENT_AMBULATORY_CARE_PROVIDER_SITE_OTHER): Payer: Medicare HMO | Admitting: Family Medicine

## 2019-06-09 ENCOUNTER — Other Ambulatory Visit (HOSPITAL_COMMUNITY)
Admission: RE | Admit: 2019-06-09 | Discharge: 2019-06-09 | Disposition: A | Discharge status: Home / Self Care | Payer: Medicare HMO | Source: Ambulatory Visit | Attending: Family Medicine | Admitting: Family Medicine

## 2019-06-09 ENCOUNTER — Encounter: Payer: Self-pay | Admitting: Family Medicine

## 2019-06-09 VITALS — BP 132/80 | HR 101 | Resp 16 | Ht 62.0 in | Wt 151.4 lb

## 2019-06-09 DIAGNOSIS — N898 Other specified noninflammatory disorders of vagina: Secondary | ICD-10-CM | POA: Diagnosis not present

## 2019-06-09 DIAGNOSIS — N760 Acute vaginitis: Secondary | ICD-10-CM | POA: Diagnosis not present

## 2019-06-09 DIAGNOSIS — R69 Illness, unspecified: Secondary | ICD-10-CM | POA: Diagnosis not present

## 2019-06-09 DIAGNOSIS — A5909 Other urogenital trichomoniasis: Secondary | ICD-10-CM | POA: Insufficient documentation

## 2019-06-09 MED ORDER — TERCONAZOLE 0.4 % VA CREA: 1.0000 | TOPICAL_CREAM | Freq: Every day | VAGINAL | 0 refills | Status: AC

## 2019-06-09 MED ORDER — FLUCONAZOLE 150 MG PO TABS: 150.0000 mg | ORAL_TABLET | ORAL | 0 refills | Status: AC

## 2019-06-09 NOTE — Progress Notes (Signed)
ACUTE VISIT   HPI:  Chief Complaint  Patient presents with  . Vaginal Discharge    Patricia [name] is a 49 y.o. female, who is here today complaining of a month of constant yellowish vaginal discharge. This is a new problem. Odorous, she does not describe fish odor but states that she does not like the smell. Vaginal pruritus. She has not noted vaginal bleeding, pelvic pain, or genital lesions. Negative for fever, chills, N/V, gross hematuria,dysuria,or skin rash. Some urinary frequency.  She is not sure about exacerbating or alleviating factors. Problem has been stable.  She tried OTC Monistat, it helped with odor and pruritus.  Treated for STD's in the past. Denies sexual intercourse, has not been sexually active for 3 years.  Review of Systems  Constitutional: Negative for chills and fever.  HENT: Negative for mouth sores and sore throat.   Gastrointestinal: Negative for abdominal pain.  Genitourinary: Negative for difficulty urinating, urgency and vaginal pain.  Musculoskeletal: Positive for arthralgias (chronic), back pain (No more than usual) and neck pain (chronic). Negative for joint swelling.  Rest see pertinent positives and negatives per HPI.   Current Outpatient Medications on File Prior to Visit  Medication Sig Dispense Refill  . amLODipine-benazepril (LOTREL) 5-10 MG capsule Take 1 capsule by mouth at bedtime. 90 capsule 2  . baclofen (LIORESAL) 10 MG tablet Take 1 tablet (10 mg total) by mouth daily as needed for muscle spasms. 60 each 2  . gabapentin (NEURONTIN) 300 MG capsule Take 1 capsule (300 mg total) by mouth 3 (three) times daily. 90 capsule 2  . hydrochlorothiazide (HYDRODIURIL) 25 MG tablet Take 1 tablet (25 mg total) by mouth daily. 90 tablet 2  . Multiple Vitamins-Minerals (MULTIVITAMIN WITH MINERALS) tablet Take 1 tablet by mouth daily.    . nicotine (NICODERM CQ - DOSED IN MG/24 HOURS) 14 mg/24hr patch Place 1 patch (14 mg total)  onto the skin daily. 28 patch 1  . omeprazole (PRILOSEC) 20 MG capsule Take 1 capsule (20 mg total) by mouth daily before breakfast. 30 capsule 3  . QUEtiapine (SEROQUEL) 100 MG tablet Take 100 mg by mouth at bedtime.    Marland Kitchen venlafaxine (EFFEXOR) 37.5 MG tablet Take 37.5 mg by mouth once.     No current facility-administered medications on file prior to visit.   Past Medical History:  Diagnosis Date  . Abnormal perimenopausal bleeding   . Cervical dysplasia   . Depression   . Head trauma   . Pregnancy induced hypertension   . Rheumatoid arthritis ([place])   . Seizure disorder ([place])   . Shingles   . STD (sexually transmitted disease)    Gonorrhea   No Known Allergies  Social History   Socioeconomic History  . Marital status: Single    Spouse name: Not on file  . Number of children: Not on file  . Years of education: Not on file  . Highest education level: Not on file  Occupational History  . Not on file  Tobacco Use  . Smoking status: Former Smoker    Packs/day: 0.10    Years: 20.00    Pack years: 2.00    Start date: 08/31/2016  . Smokeless tobacco: Never Used  . Tobacco comment: quit 04-19-2018  Substance and Sexual Activity  . Alcohol use: Yes    Comment: occasionaly  . Drug use: Yes    Types: Marijuana    Comment: last use 05-19-2018  . Sexual activity: Yes  Comment: 1st intercourse 85 yrs-5 partners  Other Topics Concern  . Not on file  Social History Narrative   Lives with Patricia [name], daughter and grandson      Lives in a two story home.      Right handed      Highest level of education- 12 grade      Disabled      Social Determinants of Health   Financial Resource Strain:   . Difficulty of Paying Living Expenses: Not on file  Food Insecurity:   . Worried About Charity fundraiser in the Last Year: Not on file  . Ran Out of Food in the Last Year: Not on file  Transportation Needs:   . Lack of Transportation (Medical): Not on file  . Lack of  Transportation (Non-Medical): Not on file  Physical Activity:   . Days of Exercise per Week: Not on file  . Minutes of Exercise per Session: Not on file  Stress:   . Feeling of Stress : Not on file  Social Connections:   . Frequency of Communication with Friends and Family: Not on file  . Frequency of Social Gatherings with Friends and Family: Not on file  . Attends Religious Services: Not on file  . Active Member of Clubs or Organizations: Not on file  . Attends Archivist Meetings: Not on file  . Marital Status: Not on file    Vitals:   06/09/19 1540  BP: 132/80  Pulse: (!) 101  Resp: 16  SpO2: 98%   Body mass index is 27.69 kg/m.  Physical Exam  Nursing note and vitals reviewed. Constitutional: She is oriented to person, place, and time. She appears well-developed. No distress.  HENT:  Head: Normocephalic and atraumatic.  Eyes: Conjunctivae are normal.  Respiratory: Effort normal. No respiratory distress.  GI: Soft. She exhibits no mass. There is no abdominal tenderness. There is no CVA tenderness.  Genitourinary: There is no rash, tenderness or lesion on the right labia. There is no rash, tenderness or lesion on the left labia. Cervix exhibits discharge. Cervix exhibits no friability.    Vaginal discharge and erythema present.     No vaginal tenderness or bleeding.  There is erythema in the vagina. No tenderness or bleeding in the vagina.    Genitourinary Comments: Thick yellow/greenish thick discharge. No odorous.   Musculoskeletal:        General: No edema.  Neurological: She is alert and oriented to person, place, and time. She has normal strength. Gait normal.  Skin: Skin is warm. No erythema.  Psychiatric: Her mood appears anxious.  Fairly groomed,good eye contact.   ASSESSMENT AND PLAN:  Ms. Patricia [name] was seen today for vaginal discharge.  Diagnoses and all orders for this visit:  Vaginal discharge We discussed possible etiologies. ?  BV, Candida  infection. According to patient, she has not been sexually active for 3 years, so the likelihood of this being STD is low.  Further recommendation will be given according to urine cytology.  -     Cervicovaginal ancillary only( [place]) -     fluconazole (DIFLUCAN) 150 MG tablet; Take 1 tablet (150 mg total) by mouth once a week for 2 doses. -     terconazole (TERAZOL 7) 0.4 % vaginal cream; Place 1 applicator vaginally at bedtime for 7 days.  Vulvovaginitis Monistat seemed to help with some symptoms, so we will treat empirically as Candida vulvovaginitis. Diflucan 150 mg weekly x2 and Terazol vaginal  cream for 7 days.  General recommendations for prevention or recurrent vaginitis given on AVS.  Return if symptoms worsen or fail to improve.   Quandra Fedorchak G. Martinique, MD  [facility]. [place] office.

## 2019-06-09 NOTE — Patient Instructions (Signed)
A few things to remember from today's visit:   Vaginal discharge - Plan: Cervicovaginal ancillary only( [place]), fluconazole (DIFLUCAN) 150 MG tablet, terconazole (TERAZOL 7) 0.4 % vaginal cream  Vulvovaginitis   Vaginitis  Vaginitis is irritation and swelling (inflammation) of the vagina. It happens when normal bacteria and yeast in the vagina grow too much. There are many types of this condition. Treatment will depend on the type you have. Follow these instructions at home: Lifestyle  Keep your vagina area clean and dry. ? Avoid using soap. ? Rinse the area with water.  Do not do the following until your doctor says it is okay: ? Wash and clean out the vagina (douche). ? Use tampons. ? Have sex.  Wipe from front to back after going to the bathroom.  Let air reach your vagina. ? Wear cotton underwear. ? Do not wear:  Underwear while you sleep.  Tight pants.  Thong underwear.  Underwear or nylons without a cotton panel. ? Take off any wet clothing, such as bathing suits, as soon as possible.  Use gentle, non-scented products. Do not use things that can irritate the vagina, such as fabric softeners. Avoid the following products if they are scented: ? Feminine sprays. ? Detergents. ? Tampons. ? Feminine hygiene products. ? Soaps or bubble baths.  Practice safe sex and use condoms. General instructions  Take over-the-counter and prescription medicines only as told by your doctor.  If you were prescribed an antibiotic medicine, take or use it as told by your doctor. Do not stop taking or using the antibiotic even if you start to feel better.  Keep all follow-up visits as told by your doctor. This is important. Contact a doctor if:  You have pain in your belly.  You have a fever.  Your symptoms last for more than 2-3 days. Get help right away if:  You have a fever and your symptoms get worse all of a sudden. Summary  Vaginitis is irritation and  swelling of the vagina. It can happen when the normal bacteria and yeast in the vagina grow too much. There are many types.  Treatment will depend on the type you have.  Do not douche, use tampons , or have sex until your health care provider approves. When you can return to sex, practice safe sex and use condoms. This information is not intended to replace advice given to you by your health care provider. Make sure you discuss any questions you have with your health care provider. Document Revised: 03/01/2017 Document Reviewed: 04/10/2016 Elsevier Patient Education  [address].  Please be sure medication list is accurate. If a new problem present, please set up appointment sooner than planned today.

## 2019-06-10 ENCOUNTER — Telehealth: Payer: Self-pay | Admitting: Family Medicine

## 2019-06-10 DIAGNOSIS — M542 Cervicalgia: Secondary | ICD-10-CM

## 2019-06-10 DIAGNOSIS — M792 Neuralgia and neuritis, unspecified: Secondary | ICD-10-CM

## 2019-06-10 MED ORDER — GABAPENTIN 300 MG PO CAPS: 300.0000 mg | ORAL_CAPSULE | Freq: Three times a day (TID) | ORAL | 2 refills | Status: DC

## 2019-06-10 NOTE — Telephone Encounter (Signed)
Rx refilled.

## 2019-06-10 NOTE — Telephone Encounter (Signed)
Pt said that she went to get prescriptions and the gabapentin was not filled. She said she needs a refill because her legs are burning bad. Pt would like medication sent to [facility] [address] [place].

## 2019-06-11 LAB — CERVICOVAGINAL ANCILLARY ONLY
Bacterial Vaginitis (gardnerella): NEGATIVE
Candida Glabrata: NEGATIVE
Candida Vaginitis: NEGATIVE
Chlamydia: NEGATIVE
Comment: NEGATIVE
Comment: NEGATIVE
Comment: NEGATIVE
Comment: NEGATIVE
Comment: NEGATIVE
Comment: NORMAL
Neisseria Gonorrhea: NEGATIVE
Trichomonas: POSITIVE — AB

## 2019-06-12 ENCOUNTER — Other Ambulatory Visit: Payer: Self-pay

## 2019-06-12 MED ORDER — METRONIDAZOLE 500 MG PO TABS: 500.0000 mg | ORAL_TABLET | Freq: Two times a day (BID) | ORAL | 0 refills | Status: AC

## 2019-07-14 ENCOUNTER — Other Ambulatory Visit: Payer: Self-pay | Admitting: Family Medicine

## 2019-07-14 DIAGNOSIS — M25512 Pain in left shoulder: Secondary | ICD-10-CM | POA: Diagnosis not present

## 2019-07-14 DIAGNOSIS — M25511 Pain in right shoulder: Secondary | ICD-10-CM | POA: Diagnosis not present

## 2019-07-14 DIAGNOSIS — G8929 Other chronic pain: Secondary | ICD-10-CM | POA: Diagnosis not present

## 2019-07-14 DIAGNOSIS — M542 Cervicalgia: Secondary | ICD-10-CM | POA: Diagnosis not present

## 2019-07-14 DIAGNOSIS — G894 Chronic pain syndrome: Secondary | ICD-10-CM | POA: Diagnosis not present

## 2019-07-14 DIAGNOSIS — M5412 Radiculopathy, cervical region: Secondary | ICD-10-CM | POA: Diagnosis not present

## 2019-07-14 DIAGNOSIS — Z79891 Long term (current) use of opiate analgesic: Secondary | ICD-10-CM | POA: Diagnosis not present

## 2019-07-14 DIAGNOSIS — F1120 Opioid dependence, uncomplicated: Secondary | ICD-10-CM | POA: Diagnosis not present

## 2019-07-30 DIAGNOSIS — F1120 Opioid dependence, uncomplicated: Secondary | ICD-10-CM | POA: Diagnosis not present

## 2019-07-30 DIAGNOSIS — G894 Chronic pain syndrome: Secondary | ICD-10-CM | POA: Diagnosis not present

## 2019-07-30 DIAGNOSIS — M25512 Pain in left shoulder: Secondary | ICD-10-CM | POA: Diagnosis not present

## 2019-07-30 DIAGNOSIS — G8929 Other chronic pain: Secondary | ICD-10-CM | POA: Diagnosis not present

## 2019-07-30 DIAGNOSIS — M25511 Pain in right shoulder: Secondary | ICD-10-CM | POA: Diagnosis not present

## 2019-07-30 DIAGNOSIS — M542 Cervicalgia: Secondary | ICD-10-CM | POA: Diagnosis not present

## 2019-07-30 DIAGNOSIS — M5412 Radiculopathy, cervical region: Secondary | ICD-10-CM | POA: Diagnosis not present

## 2019-07-30 DIAGNOSIS — Z79891 Long term (current) use of opiate analgesic: Secondary | ICD-10-CM | POA: Diagnosis not present

## 2019-07-31 DIAGNOSIS — R609 Edema, unspecified: Secondary | ICD-10-CM | POA: Diagnosis not present

## 2019-07-31 DIAGNOSIS — G629 Polyneuropathy, unspecified: Secondary | ICD-10-CM | POA: Diagnosis not present

## 2019-07-31 DIAGNOSIS — M2550 Pain in unspecified joint: Secondary | ICD-10-CM | POA: Diagnosis not present

## 2019-07-31 DIAGNOSIS — K219 Gastro-esophageal reflux disease without esophagitis: Secondary | ICD-10-CM | POA: Diagnosis not present

## 2019-07-31 DIAGNOSIS — Z79891 Long term (current) use of opiate analgesic: Secondary | ICD-10-CM | POA: Diagnosis not present

## 2019-07-31 DIAGNOSIS — R69 Illness, unspecified: Secondary | ICD-10-CM | POA: Diagnosis not present

## 2019-07-31 DIAGNOSIS — Z87891 Personal history of nicotine dependence: Secondary | ICD-10-CM | POA: Diagnosis not present

## 2019-07-31 DIAGNOSIS — G8929 Other chronic pain: Secondary | ICD-10-CM | POA: Diagnosis not present

## 2019-08-11 DIAGNOSIS — M542 Cervicalgia: Secondary | ICD-10-CM | POA: Diagnosis not present

## 2019-08-11 DIAGNOSIS — M5412 Radiculopathy, cervical region: Secondary | ICD-10-CM | POA: Diagnosis not present

## 2019-08-11 DIAGNOSIS — Z79891 Long term (current) use of opiate analgesic: Secondary | ICD-10-CM | POA: Diagnosis not present

## 2019-08-11 DIAGNOSIS — G894 Chronic pain syndrome: Secondary | ICD-10-CM | POA: Diagnosis not present

## 2019-08-11 DIAGNOSIS — F1120 Opioid dependence, uncomplicated: Secondary | ICD-10-CM | POA: Diagnosis not present

## 2019-08-11 DIAGNOSIS — M25511 Pain in right shoulder: Secondary | ICD-10-CM | POA: Diagnosis not present

## 2019-08-11 DIAGNOSIS — G8929 Other chronic pain: Secondary | ICD-10-CM | POA: Diagnosis not present

## 2019-08-11 DIAGNOSIS — M25512 Pain in left shoulder: Secondary | ICD-10-CM | POA: Diagnosis not present

## 2019-08-24 ENCOUNTER — Encounter: Payer: Medicare HMO | Attending: Registered Nurse | Admitting: Registered Nurse

## 2019-09-21 DIAGNOSIS — R69 Illness, unspecified: Secondary | ICD-10-CM | POA: Diagnosis not present

## 2019-09-22 DIAGNOSIS — R69 Illness, unspecified: Secondary | ICD-10-CM | POA: Diagnosis not present

## 2019-09-23 DIAGNOSIS — R69 Illness, unspecified: Secondary | ICD-10-CM | POA: Diagnosis not present

## 2019-09-25 DIAGNOSIS — R69 Illness, unspecified: Secondary | ICD-10-CM | POA: Diagnosis not present

## 2019-09-28 DIAGNOSIS — R69 Illness, unspecified: Secondary | ICD-10-CM | POA: Diagnosis not present

## 2019-09-29 DIAGNOSIS — R69 Illness, unspecified: Secondary | ICD-10-CM | POA: Diagnosis not present

## 2019-09-30 DIAGNOSIS — R69 Illness, unspecified: Secondary | ICD-10-CM | POA: Diagnosis not present

## 2019-10-02 DIAGNOSIS — R69 Illness, unspecified: Secondary | ICD-10-CM | POA: Diagnosis not present

## 2019-10-12 DIAGNOSIS — R69 Illness, unspecified: Secondary | ICD-10-CM | POA: Diagnosis not present

## 2019-10-14 ENCOUNTER — Other Ambulatory Visit: Payer: Self-pay | Admitting: Family Medicine

## 2019-10-14 DIAGNOSIS — I10 Essential (primary) hypertension: Secondary | ICD-10-CM

## 2019-11-10 ENCOUNTER — Ambulatory Visit: Payer: Medicare HMO | Admitting: Family Medicine

## 2019-11-26 ENCOUNTER — Other Ambulatory Visit: Payer: Self-pay

## 2019-11-26 ENCOUNTER — Ambulatory Visit
Admission: EM | Admit: 2019-11-26 | Discharge: 2019-11-26 | Disposition: A | Discharge status: Home / Self Care | Payer: Medicare HMO | Attending: Family Medicine | Admitting: Family Medicine

## 2019-11-26 DIAGNOSIS — R1013 Epigastric pain: Secondary | ICD-10-CM

## 2019-11-26 DIAGNOSIS — K529 Noninfective gastroenteritis and colitis, unspecified: Secondary | ICD-10-CM

## 2019-11-26 DIAGNOSIS — R112 Nausea with vomiting, unspecified: Secondary | ICD-10-CM

## 2019-11-26 MED ORDER — SUCRALFATE 1 G PO TABS: 1.0000 g | ORAL_TABLET | Freq: Three times a day (TID) | ORAL | 0 refills | Status: DC

## 2019-11-26 MED ORDER — ONDANSETRON HCL 4 MG PO TABS: 4.0000 mg | ORAL_TABLET | Freq: Four times a day (QID) | ORAL | 0 refills | Status: DC

## 2019-11-26 MED ORDER — OMEPRAZOLE 20 MG PO CPDR: 20.0000 mg | DELAYED_RELEASE_CAPSULE | Freq: Every day | ORAL | 0 refills | Status: DC

## 2019-11-26 NOTE — Discharge Instructions (Addendum)
Take the zofran every 6 hours as needed for nausea and vomiting  Take the omeprazole daily for at least 2 weeks  I have sent in carafate for you to take three times daily with meals  Follow up with this office or with primary care if symptoms are persisting  Follow up in the ER for high fever, increased vomiting/diarrhea, decreased urine output, trouble swallowing, trouble breathing, other concerning symptoms

## 2019-11-26 NOTE — ED Provider Notes (Signed)
[place]   [id number] 11/26/19 Arrival Time: 1209  CC: ABDOMINAL PAIN  SUBJECTIVE:  Patricia [name] is a 49 y.o. female who presents with complaint of abdominal discomfort that began abruptly x 4 days ago. Reports nausea and vomiting intermittently since then. Reports that the thinks that she ate some bad chicken. Localizes pain to epigastrum.  Describes as intermittent  and burning in character. Has not taken OTC medications for this. Denies alleviating or aggravating factors. Denies similar symptoms in the past. Last BM yesterday.    Denies fever, chills, appetite changes, weight changes, chest pain, SOB, diarrhea, constipation, hematochezia, melena, dysuria, difficulty urinating, increased frequency or urgency, flank pain, loss of bowel or bladder function, vaginal discharge, vaginal odor, vaginal bleeding, dyspareunia, pelvic pain.     Patient's last menstrual period was 07/30/2016 (lmp unknown).  ROS: As per HPI.  All other pertinent ROS negative.     Past Medical History:  Diagnosis Date  . Abnormal perimenopausal bleeding   . Cervical dysplasia   . Depression   . Head trauma   . Pregnancy induced hypertension   . Rheumatoid arthritis ([place])   . Seizure disorder ([place])   . Shingles   . STD (sexually transmitted disease)    Gonorrhea   Past Surgical History:  Procedure Laterality Date  . BICEPS TENDON REPAIR    . CESAREAN SECTION    . DILATATION & CURETTAGE/HYSTEROSCOPY WITH MYOSURE N/A 05/23/2018   Procedure: DILATATION & CURETTAGE/HYSTEROSCOPY;  Surgeon: Anastasio Auerbach, MD;  Location: [facility];  Service: Gynecology;  Laterality: N/A;  . SHOULDER SURGERY    . WISDOM TOOTH EXTRACTION     Allergies  Allergen Reactions  . Tramadol    No current facility-administered medications on file prior to encounter.   Current Outpatient Medications on File Prior to Encounter  Medication Sig Dispense Refill  . amLODipine-benazepril (LOTREL)  5-10 MG capsule TAKE 1 CAPSULE BY MOUTH AT BEDTIME 90 capsule 2  . baclofen (LIORESAL) 10 MG tablet Take 1 tablet (10 mg total) by mouth daily as needed for muscle spasms. 60 each 2  . gabapentin (NEURONTIN) 300 MG capsule Take 1 capsule (300 mg total) by mouth 3 (three) times daily. 90 capsule 2  . hydrochlorothiazide (HYDRODIURIL) 25 MG tablet TAKE 1 TABLET(25 MG) BY MOUTH DAILY 90 tablet 2  . Multiple Vitamins-Minerals (MULTIVITAMIN WITH MINERALS) tablet Take 1 tablet by mouth daily.    . nicotine (NICODERM CQ - DOSED IN MG/24 HOURS) 14 mg/24hr patch Place 1 patch (14 mg total) onto the skin daily. 28 patch 1  . QUEtiapine (SEROQUEL) 100 MG tablet Take 100 mg by mouth at bedtime.    Marland Kitchen venlafaxine (EFFEXOR) 37.5 MG tablet Take 37.5 mg by mouth once.     Social History   Socioeconomic History  . Marital status: Single    Spouse name: Not on file  . Number of children: Not on file  . Years of education: Not on file  . Highest education level: Not on file  Occupational History  . Not on file  Tobacco Use  . Smoking status: Former Smoker    Packs/day: 0.10    Years: 20.00    Pack years: 2.00    Start date: 08/31/2016  . Smokeless tobacco: Never Used  . Tobacco comment: quit 04-19-2018  Vaping Use  . Vaping Use: Never used  Substance and Sexual Activity  . Alcohol use: Yes    Comment: occasionaly  . Drug use: Yes  Types: Marijuana    Comment: last use 05-19-2018  . Sexual activity: Yes    Comment: 1st intercourse 43 yrs-5 partners  Other Topics Concern  . Not on file  Social History Narrative   Lives with Rosalee Kaufman, daughter and grandson      Lives in a two story home.      Right handed      Highest level of education- 12 grade      Disabled      Social Determinants of Health   Financial Resource Strain:   . Difficulty of Paying Living Expenses: Not on file  Food Insecurity:   . Worried About Charity fundraiser in the Last Year: Not on file  . Ran Out of Food in the  Last Year: Not on file  Transportation Needs:   . Lack of Transportation (Medical): Not on file  . Lack of Transportation (Non-Medical): Not on file  Physical Activity:   . Days of Exercise per Week: Not on file  . Minutes of Exercise per Session: Not on file  Stress:   . Feeling of Stress : Not on file  Social Connections:   . Frequency of Communication with Friends and Family: Not on file  . Frequency of Social Gatherings with Friends and Family: Not on file  . Attends Religious Services: Not on file  . Active Member of Clubs or Organizations: Not on file  . Attends Archivist Meetings: Not on file  . Marital Status: Not on file  Intimate Partner Violence:   . Fear of Current or Ex-Partner: Not on file  . Emotionally Abused: Not on file  . Physically Abused: Not on file  . Sexually Abused: Not on file   Family History  Problem Relation Age of Onset  . Healthy Father   . Cancer Maternal Grandfather        Lung  . Healthy Mother      OBJECTIVE:  Vitals:   11/26/19 1301  BP: (!) 136/92  Pulse: 93  Resp: 16  Temp: 98.2 F (36.8 C)  TempSrc: Oral  SpO2: 96%    General appearance: Alert; NAD HEENT: NCAT.  Oropharynx clear.  Lungs: clear to auscultation bilaterally without adventitious breath sounds Heart: regular rate and rhythm.  Radial pulses 2+ symmetrical bilaterally Abdomen: soft, non-distended; normal active bowel sounds; non-tender to light and deep palpation; nontender at McBurney's point; negative Murphy's sign; negative rebound; no guarding Back: no CVA tenderness Extremities: no edema; symmetrical with no gross deformities Skin: warm and dry Neurologic: normal gait Psychological: alert and cooperative; normal mood and affect  LABS: No results found for this or any previous visit (from the past 24 hour(s)).  DIAGNOSTIC STUDIES: No results found.   ASSESSMENT & PLAN:  1. Noninfectious gastroenteritis, unspecified type   2. Nausea and  vomiting, intractability of vomiting not specified, unspecified vomiting type   3. Epigastric pain     Meds ordered this encounter  Medications  . sucralfate (CARAFATE) 1 g tablet    Sig: Take 1 tablet (1 g total) by mouth 4 (four) times daily -  with meals and at bedtime for 15 days.    Dispense:  60 tablet    Refill:  0    Order Specific Question:   Supervising Provider    Answer:   Chase Picket [id number]  . ondansetron (ZOFRAN) 4 MG tablet    Sig: Take 1 tablet (4 mg total) by mouth every 6 (six) hours.  Dispense:  12 tablet    Refill:  0    Order Specific Question:   Supervising Provider    Answer:   Chase Picket [id number]  . omeprazole (PRILOSEC) 20 MG capsule    Sig: Take 1 capsule (20 mg total) by mouth daily.    Dispense:  30 capsule    Refill:  0    Order Specific Question:   Supervising Provider    Answer:   Chase Picket [id number]    Prescribed Carafate Prescribed omeprazole Get rest and drink fluids Zofran prescribed.  Take as directed.    DIET Instructions:  30 minutes after taking nausea medicine, begin with sips of clear liquids. If able to hold down 2 - 4 ounces for 30 minutes, begin drinking more. Increase your fluid intake to replace losses. Clear liquids only for 24 hours (water, tea, sport drinks, clear flat ginger ale or cola and juices, broth, jello, popsicles, ect). Advance to bland foods, applesauce, rice, baked or boiled chicken, ect. Avoid milk, greasy foods and anything that doesn't agree with you.  If you experience new or worsening symptoms return or go to ER such as fever, chills, nausea, vomiting, diarrhea, bloody or dark tarry stools, constipation, urinary symptoms, worsening abdominal discomfort, symptoms that do not improve with medications, inability to keep fluids down.  Reviewed expectations re: course of current medical issues. Questions answered. Outlined signs and symptoms indicating need for more acute  intervention. Patient verbalized understanding. After Visit Summary given.   Faustino Congress, NP 11/26/19 [phone]

## 2019-11-26 NOTE — ED Triage Notes (Addendum)
Pt reports she was diagnosed with food poisoning a week ago. States she is still having generalized abdominal pain and decreased appetite causing her to feel weak all over.   Reports before she got diagnosed with food poisoning she was having some sinus drainage that continues.

## 2019-12-29 ENCOUNTER — Telehealth: Payer: Self-pay | Admitting: Family Medicine

## 2019-12-29 NOTE — Telephone Encounter (Signed)
She's coming on 10/4. Was this changed to Baclofen? I don't see the Tizanidine on her list anymore.

## 2019-12-29 NOTE — Telephone Encounter (Signed)
Pt requsting refill for: Tizanidine 4mg  Send to: [facility] [phone] - [address]  [place], [place] [address]  Phone:  [phone] Fax:  [phone]

## 2020-01-04 ENCOUNTER — Other Ambulatory Visit: Payer: Self-pay

## 2020-01-04 ENCOUNTER — Ambulatory Visit (INDEPENDENT_AMBULATORY_CARE_PROVIDER_SITE_OTHER): Payer: 59 | Admitting: Family Medicine

## 2020-01-04 ENCOUNTER — Encounter: Payer: Self-pay | Admitting: Family Medicine

## 2020-01-04 VITALS — BP 128/80 | HR 90 | Resp 16 | Ht 62.0 in | Wt 167.0 lb

## 2020-01-04 DIAGNOSIS — M542 Cervicalgia: Secondary | ICD-10-CM | POA: Diagnosis not present

## 2020-01-04 DIAGNOSIS — F319 Bipolar disorder, unspecified: Secondary | ICD-10-CM

## 2020-01-04 DIAGNOSIS — I10 Essential (primary) hypertension: Secondary | ICD-10-CM

## 2020-01-04 DIAGNOSIS — E6609 Other obesity due to excess calories: Secondary | ICD-10-CM

## 2020-01-04 DIAGNOSIS — Z6831 Body mass index (BMI) 31.0-31.9, adult: Secondary | ICD-10-CM

## 2020-01-04 MED ORDER — QUETIAPINE FUMARATE 100 MG PO TABS: 100.0000 mg | ORAL_TABLET | Freq: Every day | ORAL | 1 refills | Status: DC

## 2020-01-04 MED ORDER — VENLAFAXINE HCL 37.5 MG PO TABS: 37.5000 mg | ORAL_TABLET | Freq: Every day | ORAL | 1 refills | Status: DC

## 2020-01-04 MED ORDER — BACLOFEN 10 MG PO TABS: 10.0000 mg | ORAL_TABLET | Freq: Every day | ORAL | 2 refills | Status: DC | PRN

## 2020-01-04 MED ORDER — AMLODIPINE BESY-BENAZEPRIL HCL 5-10 MG PO CAPS: 1.0000 | ORAL_CAPSULE | Freq: Every day | ORAL | 2 refills | Status: DC

## 2020-01-04 NOTE — Telephone Encounter (Signed)
Pt had visit today.

## 2020-01-04 NOTE — Progress Notes (Signed)
HPI: [name] is a 49 y.o. female, who is here today for chronic disease management.   She was last seen on 06/09/19.  She is not longer seeing psychiatrist, last time was months ago. She changed insurance now and planning on arranging appt with her former provider. She is not on medications.  She is following with pain management, she follows once per months.  She needs refills for Baclofen , helps with loosing muscles up.  HTN: She is on HCTZ 25 mg daily and amlodipine-benazepril 5-10 mg daily. She is checking BP, readings are "good." Negative for severe/frequent headache, visual changes, chest pain, dyspnea, palpitation, focal weakness, or edema.  Lab Results  Component Value Date   CREATININE 0.56 05/20/2019   BUN 14 05/20/2019   NA 142 05/20/2019   K 4.0 05/20/2019   CL 105 05/20/2019   CO2 32 05/20/2019   She is not exercising regularly, she tries to walk a lot. She eats a lot boil and bake, not a lot fried food. She snacks on peanut butter and jelly sandwich, cracker.  Chronic pain: Upper and lower back pain. +Radiculopathy, cervical and lumbar. She is on Gabapentin 300 mg tid. She also takes Baclofen 10 mg bid prn. Established with pain management, Dr Jackey Loge.  Review of Systems  Constitutional: Negative for activity change, appetite change, fatigue and fever.  HENT: Negative for mouth sores, nosebleeds and sore throat.   Respiratory: Negative for cough and wheezing.   Gastrointestinal: Negative for abdominal pain, nausea and vomiting.       Negative for changes in bowel habits.  Genitourinary: Negative for decreased urine volume and hematuria.  Neurological: Negative for syncope, facial asymmetry and weakness.  Rest of ROS, see pertinent positives sand negatives in HPI  Current Outpatient Medications on File Prior to Visit  Medication Sig Dispense Refill  . gabapentin (NEURONTIN) 300 MG capsule Take 1 capsule (300 mg total) by mouth 3  (three) times daily. 90 capsule 2  . hydrochlorothiazide (HYDRODIURIL) 25 MG tablet TAKE 1 TABLET(25 MG) BY MOUTH DAILY 90 tablet 2  . Multiple Vitamins-Minerals (MULTIVITAMIN WITH MINERALS) tablet Take 1 tablet by mouth daily.    . nicotine (NICODERM CQ - DOSED IN MG/24 HOURS) 14 mg/24hr patch Place 1 patch (14 mg total) onto the skin daily. 28 patch 1  . ondansetron (ZOFRAN) 4 MG tablet Take 1 tablet (4 mg total) by mouth every 6 (six) hours. 12 tablet 0  . omeprazole (PRILOSEC) 20 MG capsule Take 1 capsule (20 mg total) by mouth daily. 30 capsule 0  . sucralfate (CARAFATE) 1 g tablet Take 1 tablet (1 g total) by mouth 4 (four) times daily -  with meals and at bedtime for 15 days. 60 tablet 0   No current facility-administered medications on file prior to visit.   Past Medical History:  Diagnosis Date  . Abnormal perimenopausal bleeding   . Cervical dysplasia   . Depression   . Head trauma   . Pregnancy induced hypertension   . Rheumatoid arthritis ([place])   . Seizure disorder ([place])   . Shingles   . STD (sexually transmitted disease)    Gonorrhea   Allergies  Allergen Reactions  . Tramadol     Social History   Socioeconomic History  . Marital status: Single    Spouse name: Not on file  . Number of children: Not on file  . Years of education: Not on file  . Highest education level: Not on  file  Occupational History  . Not on file  Tobacco Use  . Smoking status: Former Smoker    Packs/day: 0.10    Years: 20.00    Pack years: 2.00    Start date: 08/31/2016  . Smokeless tobacco: Never Used  . Tobacco comment: quit 04-19-2018  Vaping Use  . Vaping Use: Never used  Substance and Sexual Activity  . Alcohol use: Yes    Comment: occasionaly  . Drug use: Yes    Types: Marijuana    Comment: last use 05-19-2018  . Sexual activity: Yes    Comment: 1st intercourse 43 yrs-5 partners  Other Topics Concern  . Not on file  Social History Narrative   Lives with Rosalee Kaufman, daughter  and grandson      Lives in a two story home.      Right handed      Highest level of education- 12 grade      Disabled      Social Determinants of Health   Financial Resource Strain:   . Difficulty of Paying Living Expenses: Not on file  Food Insecurity:   . Worried About Charity fundraiser in the Last Year: Not on file  . Ran Out of Food in the Last Year: Not on file  Transportation Needs:   . Lack of Transportation (Medical): Not on file  . Lack of Transportation (Non-Medical): Not on file  Physical Activity:   . Days of Exercise per Week: Not on file  . Minutes of Exercise per Session: Not on file  Stress:   . Feeling of Stress : Not on file  Social Connections:   . Frequency of Communication with Friends and Family: Not on file  . Frequency of Social Gatherings with Friends and Family: Not on file  . Attends Religious Services: Not on file  . Active Member of Clubs or Organizations: Not on file  . Attends Archivist Meetings: Not on file  . Marital Status: Not on file    Vitals:   01/04/20 0917  BP: 128/80  Pulse: 90  Resp: 16  SpO2: 97%   Wt Readings from Last 3 Encounters:  01/04/20 167 lb (75.8 kg)  06/09/19 151 lb 6 oz (68.7 kg)  02/24/19 146 lb (66.2 kg)   Body mass index is 30.54 kg/m.  Physical Exam Vitals and nursing note reviewed.  Constitutional:      General: She is not in acute distress.    Appearance: She is well-developed.  HENT:     Head: Normocephalic and atraumatic.     Mouth/Throat:     Mouth: Mucous membranes are moist.     Dentition: Has dentures.     Pharynx: Oropharynx is clear.  Eyes:     Conjunctiva/sclera: Conjunctivae normal.     Pupils: Pupils are equal, round, and reactive to light.  Cardiovascular:     Rate and Rhythm: Normal rate and regular rhythm.     Pulses:          Posterior tibial pulses are 2+ on the right side and 2+ on the left side.     Heart sounds: No murmur heard.   Pulmonary:     Effort:  Pulmonary effort is normal. No respiratory distress.     Breath sounds: Normal breath sounds.  Abdominal:     Palpations: Abdomen is soft. There is no hepatomegaly or mass.     Tenderness: There is no abdominal tenderness.  Musculoskeletal:     Cervical  back: Spasms and tenderness present. No edema or bony tenderness. Decreased range of motion.       Back:  Lymphadenopathy:     Cervical: No cervical adenopathy.  Skin:    General: Skin is warm.     Findings: No erythema or rash.  Neurological:     General: No focal deficit present.     Mental Status: She is alert and oriented to person, place, and time.     Cranial Nerves: No cranial nerve deficit.     Gait: Gait normal.    ASSESSMENT AND PLAN:  Ms. HAZEL LEVEILLE was seen today for chronic disease management.  Orders Placed This Encounter  Procedures  . COMPLETE METABOLIC PANEL WITH GFR   Lab Results  Component Value Date   CREATININE 0.61 01/04/2020   BUN 10 01/04/2020   NA 141 01/04/2020   K 4.1 01/04/2020   CL 106 01/04/2020   CO2 29 01/04/2020   Lab Results  Component Value Date   ALT 34 (H) 01/04/2020   AST 33 01/04/2020   ALKPHOS 86 05/20/2019   BILITOT 0.5 01/04/2020   Hypertension, essential, benign BP adequately controlled. Continue Amlodipine-Benazepril 5-10 mg daily. And HCTZ 25 mg daily. Low salt diet. Continue monitoring BP regularly.  -     amLODipine-benazepril (LOTREL) 5-10 MG capsule; Take 1 capsule by mouth at bedtime.  Cervicalgia With radiculopathy. Chronic,stable. Continue Baclofen 10 mg bid as needed and Gabapentin 300 mg tid. Follows with pain management.  -     baclofen (LIORESAL) 10 MG tablet; Take 1 tablet (10 mg total) by mouth daily as needed for muscle spasms.  Class 1 obesity due to excess calories with body mass index (BMI) of 31.0 to 31.9 in adult, unspecified whether serious comorbidity present Steadily gaining wt. We discussed benefits of wt loss as well as adverse  effects of obesity. Consistency with healthy diet and physical activity recommended. Calorie count. 10-15 min of daily walking.  Bipolar 1 disorder, depressed ([place]) Strongly recommend arranging f/u appt with her psychiatrist. Resume Effexor and Seroquel. Instructed about warning signs.  -     venlafaxine (EFFEXOR) 37.5 MG tablet; Take 1 tablet (37.5 mg total) by mouth daily. -     QUEtiapine (SEROQUEL) 100 MG tablet; Take 1 tablet (100 mg total) by mouth at bedtime.   Return in about 5 months (around 06/03/2020) for Needs AWV. Appt for flu vaccine later this month.   Ruqayyah Lute G. [place], MD  [facility] [place] office.  A few things to remember from today's visit:   Hypertension, essential, benign - Plan: COMPLETE METABOLIC PANEL WITH GFR  Bipolar 1 disorder, depressed ([place]), Chronic - Plan: venlafaxine (EFFEXOR) 37.5 MG tablet, QUEtiapine (SEROQUEL) 100 MG tablet  Class 1 obesity due to excess calories with body mass index (BMI) of 31.0 to 31.9 in adult, unspecified whether serious comorbidity present  Cervicalgia - Plan: baclofen (LIORESAL) 10 MG tablet  If you need refills please call your pharmacy. Do not use My Chart to request refills or for acute issues that need immediate attention.  Resume depression meds and arrange appointment with psychiatrist again. Start walking 15-30 min at least 5 times per week. Keep a food diary or download a apps for calorie count, < 1800 cal/day. Pool exercises and low impact will help with wt loss.    Please be sure medication list is accurate. If a new problem present, please set up appointment sooner than planned today.

## 2020-01-04 NOTE — Patient Instructions (Addendum)
A few things to remember from today's visit:   Hypertension, essential, benign - Plan: COMPLETE METABOLIC PANEL WITH GFR  Bipolar 1 disorder, depressed ([place]), Chronic - Plan: venlafaxine (EFFEXOR) 37.5 MG tablet, QUEtiapine (SEROQUEL) 100 MG tablet  Class 1 obesity due to excess calories with body mass index (BMI) of 31.0 to 31.9 in adult, unspecified whether serious comorbidity present  Cervicalgia - Plan: baclofen (LIORESAL) 10 MG tablet  If you need refills please call your pharmacy. Do not use My Chart to request refills or for acute issues that need immediate attention.  Resume depression meds and arrange appointment with psychiatrist again. Start walking 15-30 min at least 5 times per week. Keep a food diary or download a apps for calorie count, < 1800 cal/day. Pool exercises and low impact will help with wt loss.    Please be sure medication list is accurate. If a new problem present, please set up appointment sooner than planned today.

## 2020-01-05 LAB — COMPLETE METABOLIC PANEL WITH GFR
AG Ratio: 1.5 (calc) (ref 1.0–2.5)
ALT: 34 U/L — ABNORMAL HIGH (ref 6–29)
AST: 33 U/L (ref 10–35)
Albumin: 4.4 g/dL (ref 3.6–5.1)
Alkaline phosphatase (APISO): 101 U/L (ref 37–153)
BUN: 10 mg/dL (ref 7–25)
CO2: 29 mmol/L (ref 20–32)
Calcium: 10 mg/dL (ref 8.6–10.4)
Chloride: 106 mmol/L (ref 98–110)
Creat: 0.61 mg/dL (ref 0.50–1.05)
GFR, Est African American: 123 mL/min/{1.73_m2} (ref >=60)
GFR, Est Non African American: 106 mL/min/{1.73_m2} (ref >=60)
Globulin: 3 g/dL (calc) (ref 1.9–3.7)
Glucose, Bld: 86 mg/dL (ref 65–99)
Potassium: 4.1 mmol/L (ref 3.5–5.3)
Sodium: 141 mmol/L (ref 135–146)
Total Bilirubin: 0.5 mg/dL (ref 0.2–1.2)
Total Protein: 7.4 g/dL (ref 6.1–8.1)

## 2020-01-08 ENCOUNTER — Encounter: Payer: Self-pay | Admitting: Family Medicine

## 2020-01-14 ENCOUNTER — Telehealth: Payer: Self-pay | Admitting: Family Medicine

## 2020-01-14 NOTE — Telephone Encounter (Signed)
Pt is calling in stating that she saw a missed call and would like to have a call back for her lab results

## 2020-01-15 NOTE — Telephone Encounter (Signed)
See result note.  

## 2020-02-03 ENCOUNTER — Telehealth: Payer: Self-pay | Admitting: Family Medicine

## 2020-02-03 DIAGNOSIS — M542 Cervicalgia: Secondary | ICD-10-CM

## 2020-02-03 MED ORDER — BACLOFEN 10 MG PO TABS: 10.0000 mg | ORAL_TABLET | Freq: Every day | ORAL | 2 refills | Status: DC | PRN

## 2020-02-03 NOTE — Telephone Encounter (Signed)
Pt is calling in stating that she went to her pharmacy and it they did not have her Rx baclofen (LIORESAL) 10 MG and would like to see if it can be resent.  Pharm:  [facility] on [address] (at [place])

## 2020-02-03 NOTE — Telephone Encounter (Signed)
Rx resent.

## 2020-02-08 NOTE — Progress Notes (Deleted)
HPI: [name] is a 49 y.o. female, who is here today for her routine physical.  Last CPE: ***  Regular exercise 3 or more time per week: *** Following a healthy diet: *** She lives with ***  Chronic medical problems: chronic pain,HTN,bipolar disorder,neuropathic pain.  Pap smear:S/P hysterectomy. Hx of abnormal pap smears: *** Hx of STD's ***  Immunization History  Administered Date(s) Administered  . Influenza,inj,Quad PF,6+ Mos 03/10/2018  . Influenza-Unspecified 01/17/2017    Mammogram: 01/29/09. Colonoscopy: *** DEXA: N/A  Hep C screening: ***  She has *** concerns today.  Review of Systems  Current Outpatient Medications on File Prior to Visit  Medication Sig Dispense Refill  . amLODipine-benazepril (LOTREL) 5-10 MG capsule Take 1 capsule by mouth at bedtime. 90 capsule 2  . baclofen (LIORESAL) 10 MG tablet Take 1 tablet (10 mg total) by mouth daily as needed for muscle spasms. 60 each 2  . gabapentin (NEURONTIN) 300 MG capsule Take 1 capsule (300 mg total) by mouth 3 (three) times daily. 90 capsule 2  . hydrochlorothiazide (HYDRODIURIL) 25 MG tablet TAKE 1 TABLET(25 MG) BY MOUTH DAILY 90 tablet 2  . Multiple Vitamins-Minerals (MULTIVITAMIN WITH MINERALS) tablet Take 1 tablet by mouth daily.    . nicotine (NICODERM CQ - DOSED IN MG/24 HOURS) 14 mg/24hr patch Place 1 patch (14 mg total) onto the skin daily. 28 patch 1  . omeprazole (PRILOSEC) 20 MG capsule Take 1 capsule (20 mg total) by mouth daily. 30 capsule 0  . ondansetron (ZOFRAN) 4 MG tablet Take 1 tablet (4 mg total) by mouth every 6 (six) hours. 12 tablet 0  . QUEtiapine (SEROQUEL) 100 MG tablet Take 1 tablet (100 mg total) by mouth at bedtime. 30 tablet 1  . sucralfate (CARAFATE) 1 g tablet Take 1 tablet (1 g total) by mouth 4 (four) times daily -  with meals and at bedtime for 15 days. 60 tablet 0  . venlafaxine (EFFEXOR) 37.5 MG tablet Take 1 tablet (37.5 mg total) by mouth daily. 30 tablet  1   No current facility-administered medications on file prior to visit.     Past Medical History:  Diagnosis Date  . Abnormal perimenopausal bleeding   . Cervical dysplasia   . Depression   . Head trauma   . Pregnancy induced hypertension   . Rheumatoid arthritis ([place])   . Seizure disorder ([place])   . Shingles   . STD (sexually transmitted disease)    Gonorrhea    Past Surgical History:  Procedure Laterality Date  . BICEPS TENDON REPAIR    . CESAREAN SECTION    . DILATATION & CURETTAGE/HYSTEROSCOPY WITH MYOSURE N/A 05/23/2018   Procedure: DILATATION & CURETTAGE/HYSTEROSCOPY;  Surgeon: Anastasio Auerbach, MD;  Location: [facility];  Service: Gynecology;  Laterality: N/A;  . SHOULDER SURGERY    . WISDOM TOOTH EXTRACTION      Allergies  Allergen Reactions  . Tramadol     Family History  Problem Relation Age of Onset  . Healthy Father   . Cancer Maternal Grandfather        Lung  . Healthy Mother     Social History   Socioeconomic History  . Marital status: Single    Spouse name: Not on file  . Number of children: Not on file  . Years of education: Not on file  . Highest education level: Not on file  Occupational History  . Not on file  Tobacco Use  .  Smoking status: Former Smoker    Packs/day: 0.10    Years: 20.00    Pack years: 2.00    Start date: 08/31/2016  . Smokeless tobacco: Never Used  . Tobacco comment: quit 04-19-2018  Vaping Use  . Vaping Use: Never used  Substance and Sexual Activity  . Alcohol use: Yes    Comment: occasionaly  . Drug use: Yes    Types: Marijuana    Comment: last use 05-19-2018  . Sexual activity: Yes    Comment: 1st intercourse 89 yrs-5 partners  Other Topics Concern  . Not on file  Social History Narrative   Lives with Rosalee Kaufman, daughter and grandson      Lives in a two story home.      Right handed      Highest level of education- 12 grade      Disabled      Social Determinants of Health    Financial Resource Strain:   . Difficulty of Paying Living Expenses: Not on file  Food Insecurity:   . Worried About Charity fundraiser in the Last Year: Not on file  . Ran Out of Food in the Last Year: Not on file  Transportation Needs:   . Lack of Transportation (Medical): Not on file  . Lack of Transportation (Non-Medical): Not on file  Physical Activity:   . Days of Exercise per Week: Not on file  . Minutes of Exercise per Session: Not on file  Stress:   . Feeling of Stress : Not on file  Social Connections:   . Frequency of Communication with Friends and Family: Not on file  . Frequency of Social Gatherings with Friends and Family: Not on file  . Attends Religious Services: Not on file  . Active Member of Clubs or Organizations: Not on file  . Attends Archivist Meetings: Not on file  . Marital Status: Not on file     There were no vitals filed for this visit. There is no height or weight on file to calculate BMI.   Wt Readings from Last 3 Encounters:  01/04/20 167 lb (75.8 kg)  06/09/19 151 lb 6 oz (68.7 kg)  02/24/19 146 lb (66.2 kg)     Physical Exam    ASSESSMENT AND PLAN:  Ms. Patricia [name] was here today annual physical examination.     No orders of the defined types were placed in this encounter.   There are no diagnoses linked to this encounter.  No problem-specific Assessment & Plan notes found for this encounter.            No follow-ups on file.          Abbegail Matuska G. [place], MD  [facility]. [place] office.

## 2020-02-09 ENCOUNTER — Encounter: Payer: 59 | Admitting: Family Medicine

## 2020-02-09 DIAGNOSIS — Z1159 Encounter for screening for other viral diseases: Secondary | ICD-10-CM

## 2020-02-17 ENCOUNTER — Telehealth: Payer: Self-pay | Admitting: Family Medicine

## 2020-02-17 NOTE — Telephone Encounter (Signed)
Form on providers desk to be signed

## 2020-02-17 NOTE — Telephone Encounter (Signed)
Total and Permanent Disability form to be filled out--place in dr's folder.  Fax to [phone] and call patient at [phone] upon completion.

## 2020-02-19 NOTE — Telephone Encounter (Signed)
Form faxed, copy placed up front for pt to pick up, pt is aware.

## 2020-03-04 ENCOUNTER — Other Ambulatory Visit: Payer: Self-pay | Admitting: Family Medicine

## 2020-03-04 DIAGNOSIS — F319 Bipolar disorder, unspecified: Secondary | ICD-10-CM

## 2020-03-24 ENCOUNTER — Telehealth: Payer: Self-pay | Admitting: Family Medicine

## 2020-03-24 NOTE — Telephone Encounter (Signed)
Left message for patient to call back and schedule Medicare Annual Wellness Visit (AWV) either virtually or in office.   Last AWV no information please schedule at anytime with [facility] Nurse Health Advisor 1 or 2   This should be a 45 minute visit. 

## 2020-04-13 ENCOUNTER — Ambulatory Visit (INDEPENDENT_AMBULATORY_CARE_PROVIDER_SITE_OTHER): Payer: 59

## 2020-04-13 DIAGNOSIS — Z1211 Encounter for screening for malignant neoplasm of colon: Secondary | ICD-10-CM

## 2020-04-13 DIAGNOSIS — Z1231 Encounter for screening mammogram for malignant neoplasm of breast: Secondary | ICD-10-CM

## 2020-04-13 DIAGNOSIS — Z0000 Encounter for general adult medical examination without abnormal findings: Secondary | ICD-10-CM

## 2020-04-13 NOTE — Patient Instructions (Signed)
Patricia [name] , Thank you for taking time to come for your Medicare Wellness Visit. I appreciate your ongoing commitment to your health goals. Please review the following plan we discussed and let me know if I can assist you in the future.   Screening recommendations/referrals: Colonoscopy: Currently due, orders placed this visit  Recommended yearly ophthalmology/optometry visit for glaucoma screening and checkup Recommended yearly dental visit for hygiene and checkup  Vaccinations: Influenza vaccine: Currently due, if you wish to receive you may do so at our office or at your local pharmacy  Pneumococcal vaccine: Not currently due a this time  Tdap vaccine: Currently due, if you wish to receive you may do so at our office or your local pharmacy  Shingles vaccine: Currently due for Shingrix, we recommend that you receive at your local pharmacy     Advanced directives: Advance directive discussed with you today. Even though you declined this today please call our office should you change your mind and we can give you the proper paperwork for you to fill out.   Conditions/risks identified: None   Next appointment: 04/14/2021 @ 2:45 PM with Embden Years, Female Preventive care refers to lifestyle choices and visits with your health care provider that can promote health and wellness. What does preventive care include?  A yearly physical exam. This is also called an annual well check.  Dental exams once or twice a year.  Routine eye exams. Ask your health care provider how often you should have your eyes checked.  Personal lifestyle choices, including:  Daily care of your teeth and gums.  Regular physical activity.  Eating a healthy diet.  Avoiding tobacco and drug use.  Limiting alcohol use.  Practicing safe sex.  Taking low-dose aspirin every day starting at age 82. What happens during an annual well check? The services and screenings  done by your health care provider during your annual well check will depend on your age, overall health, lifestyle risk factors, and family history of disease. Counseling  Your health care provider may ask you questions about your:  Alcohol use.  Tobacco use.  Drug use.  Emotional well-being.  Home and relationship well-being.  Sexual activity.  Eating habits.  Work and work Statistician. Screening  You may have the following tests or measurements:  Height, weight, and BMI.  Blood pressure.  Lipid and cholesterol levels. These may be checked every 5 years, or more frequently if you are over 60 years old.  Skin check.  Lung cancer screening. You may have this screening every year starting at age 49 if you have a 30-pack-year history of smoking and currently smoke or have quit within the past 15 years.  Fecal occult blood test (FOBT) of the stool. You may have this test every year starting at age 4.  Flexible sigmoidoscopy or colonoscopy. You may have a sigmoidoscopy every 5 years or a colonoscopy every 10 years starting at age 86.  Prostate cancer screening. Recommendations will vary depending on your family history and other risks.  Hepatitis C blood test.  Hepatitis B blood test.  Sexually transmitted disease (STD) testing.  Diabetes screening. This is done by checking your blood sugar (glucose) after you have not eaten for a while (fasting). You may have this done every 1-3 years. Discuss your test results, treatment options, and if necessary, the need for more tests with your health care provider. Vaccines  Your health care provider may recommend certain vaccines,  such as:  Influenza vaccine. This is recommended every year.  Tetanus, diphtheria, and acellular pertussis (Tdap, Td) vaccine. You may need a Td booster every 10 years.  Zoster vaccine. You may need this after age 28.  Pneumococcal 13-valent conjugate (PCV13) vaccine. You may need this if you have  certain conditions and have not been vaccinated.  Pneumococcal polysaccharide (PPSV23) vaccine. You may need one or two doses if you smoke cigarettes or if you have certain conditions. Talk to your health care provider about which screenings and vaccines you need and how often you need them. This information is not intended to replace advice given to you by your health care provider. Make sure you discuss any questions you have with your health care provider. Document Released: 04/15/2015 Document Revised: 12/07/2015 Document Reviewed: 01/18/2015 Elsevier Interactive Patient Education  2017 [place] Prevention in the Home Falls can cause injuries. They can happen to people of all ages. There are many things you can do to make your home safe and to help prevent falls. What can I do on the outside of my home?  Regularly fix the edges of walkways and driveways and fix any cracks.  Remove anything that might make you trip as you walk through a door, such as a raised step or threshold.  Trim any bushes or trees on the path to your home.  Use bright outdoor lighting.  Clear any walking paths of anything that might make someone trip, such as rocks or tools.  Regularly check to see if handrails are loose or broken. Make sure that both sides of any steps have handrails.  Any raised decks and porches should have guardrails on the edges.  Have any leaves, snow, or ice cleared regularly.  Use sand or salt on walking paths during winter.  Clean up any spills in your garage right away. This includes oil or grease spills. What can I do in the bathroom?  Use night lights.  Install grab bars by the toilet and in the tub and shower. Do not use towel bars as grab bars.  Use non-skid mats or decals in the tub or shower.  If you need to sit down in the shower, use a plastic, non-slip stool.  Keep the floor dry. Clean up any water that spills on the floor as soon as it  happens.  Remove soap buildup in the tub or shower regularly.  Attach bath mats securely with double-sided non-slip rug tape.  Do not have throw rugs and other things on the floor that can make you trip. What can I do in the bedroom?  Use night lights.  Make sure that you have a light by your bed that is easy to reach.  Do not use any sheets or blankets that are too big for your bed. They should not hang down onto the floor.  Have a firm chair that has side arms. You can use this for support while you get dressed.  Do not have throw rugs and other things on the floor that can make you trip. What can I do in the kitchen?  Clean up any spills right away.  Avoid walking on wet floors.  Keep items that you use a lot in easy-to-reach places.  If you need to reach something above you, use a strong step stool that has a grab bar.  Keep electrical cords out of the way.  Do not use floor polish or wax that makes floors slippery. If you  must use wax, use non-skid floor wax.  Do not have throw rugs and other things on the floor that can make you trip. What can I do with my stairs?  Do not leave any items on the stairs.  Make sure that there are handrails on both sides of the stairs and use them. Fix handrails that are broken or loose. Make sure that handrails are as long as the stairways.  Check any carpeting to make sure that it is firmly attached to the stairs. Fix any carpet that is loose or worn.  Avoid having throw rugs at the top or bottom of the stairs. If you do have throw rugs, attach them to the floor with carpet tape.  Make sure that you have a light switch at the top of the stairs and the bottom of the stairs. If you do not have them, ask someone to add them for you. What else can I do to help prevent falls?  Wear shoes that:  Do not have high heels.  Have rubber bottoms.  Are comfortable and fit you well.  Are closed at the toe. Do not wear sandals.  If you  use a stepladder:  Make sure that it is fully opened. Do not climb a closed stepladder.  Make sure that both sides of the stepladder are locked into place.  Ask someone to hold it for you, if possible.  Clearly mark and make sure that you can see:  Any grab bars or handrails.  First and last steps.  Where the edge of each step is.  Use tools that help you move around (mobility aids) if they are needed. These include:  Canes.  Walkers.  Scooters.  Crutches.  Turn on the lights when you go into a dark area. Replace any light bulbs as soon as they burn out.  Set up your furniture so you have a clear path. Avoid moving your furniture around.  If any of your floors are uneven, fix them.  If there are any pets around you, be aware of where they are.  Review your medicines with your doctor. Some medicines can make you feel dizzy. This can increase your chance of falling. Ask your doctor what other things that you can do to help prevent falls. This information is not intended to replace advice given to you by your health care provider. Make sure you discuss any questions you have with your health care provider. Document Released: 01/13/2009 Document Revised: 08/25/2015 Document Reviewed: 04/23/2014 Elsevier Interactive Patient Education  2017 [employer].

## 2020-04-13 NOTE — Progress Notes (Signed)
Subjective:   Patricia [name] is a 50 y.o. female who presents for an Initial Medicare Annual Wellness Visit.  I connected with Patricia [name] today by telephone and verified that I am speaking with the correct person using two identifiers. Location patient: home Location provider: work Persons participating in the virtual visit: patient, provider.   I discussed the limitations, risks, security and privacy concerns of performing an evaluation and management service by telephone and the availability of in person appointments. I also discussed with the patient that there may be a patient responsible charge related to this service. The patient expressed understanding and verbally consented to this telephonic visit.    Interactive audio and video telecommunications were attempted between this provider and patient, however failed, due to patient having technical difficulties OR patient did not have access to video capability.  We continued and completed visit with audio only.      Review of Systems    N/A  Cardiac Risk Factors include: hypertension     Objective:    Today's Vitals   04/13/20 1448  PainSc: 7   PainLoc: Back   There is no height or weight on file to calculate BMI.  Advanced Directives 04/13/2020 05/23/2018 05/18/2017 11/18/2016 04/04/2014  Does Patient Have a Medical Advance Directive? No No No No No  Would patient like information on creating a medical advance directive? No - Patient declined Yes (MAU/Ambulatory/Procedural Areas - Information given) No - Patient declined - No - patient declined information    Current Medications (verified) Outpatient Encounter Medications as of 04/13/2020  Medication Sig  . amLODipine-benazepril (LOTREL) 5-10 MG capsule Take 1 capsule by mouth at bedtime.  . baclofen (LIORESAL) 10 MG tablet Take 1 tablet (10 mg total) by mouth daily as needed for muscle spasms.  Marland Kitchen gabapentin (NEURONTIN) 300 MG capsule Take 1 capsule (300 mg total)  by mouth 3 (three) times daily.  . hydrochlorothiazide (HYDRODIURIL) 25 MG tablet TAKE 1 TABLET(25 MG) BY MOUTH DAILY  . Multiple Vitamins-Minerals (MULTIVITAMIN WITH MINERALS) tablet Take 1 tablet by mouth daily.  . nicotine (NICODERM CQ - DOSED IN MG/24 HOURS) 14 mg/24hr patch Place 1 patch (14 mg total) onto the skin daily.  Marland Kitchen omeprazole (PRILOSEC) 20 MG capsule Take 1 capsule (20 mg total) by mouth daily.  . ondansetron (ZOFRAN) 4 MG tablet Take 1 tablet (4 mg total) by mouth every 6 (six) hours.  Marland Kitchen QUEtiapine (SEROQUEL) 100 MG tablet Take 1 tablet (100 mg total) by mouth at bedtime.  Marland Kitchen venlafaxine (EFFEXOR) 37.5 MG tablet TAKE 1 TABLET(37.5 MG) BY MOUTH DAILY  . sucralfate (CARAFATE) 1 g tablet Take 1 tablet (1 g total) by mouth 4 (four) times daily -  with meals and at bedtime for 15 days. (Patient not taking: Reported on 04/13/2020)   No facility-administered encounter medications on file as of 04/13/2020.    Allergies (verified) Tramadol   History: Past Medical History:  Diagnosis Date  . Abnormal perimenopausal bleeding   . Cervical dysplasia   . Depression   . Head trauma   . Pregnancy induced hypertension   . Rheumatoid arthritis ([place])   . Seizure disorder ([place])   . Shingles   . STD (sexually transmitted disease)    Gonorrhea   Past Surgical History:  Procedure Laterality Date  . BICEPS TENDON REPAIR    . CESAREAN SECTION    . DILATATION & CURETTAGE/HYSTEROSCOPY WITH MYOSURE N/A 05/23/2018   Procedure: DILATATION & CURETTAGE/HYSTEROSCOPY;  Surgeon: Anastasio Auerbach,  MD;  Location: [facility];  Service: Gynecology;  Laterality: N/A;  . SHOULDER SURGERY    . WISDOM TOOTH EXTRACTION     Family History  Problem Relation Age of Onset  . Healthy Father   . Cancer Maternal Grandfather        Lung  . Healthy Mother    Social History   Socioeconomic History  . Marital status: Single    Spouse name: Not on file  . Number of children: Not on file  .  Years of education: Not on file  . Highest education level: Not on file  Occupational History  . Not on file  Tobacco Use  . Smoking status: Former Smoker    Packs/day: 0.10    Years: 20.00    Pack years: 2.00    Start date: 08/31/2016  . Smokeless tobacco: Never Used  . Tobacco comment: quit 04-19-2018  Vaping Use  . Vaping Use: Never used  Substance and Sexual Activity  . Alcohol use: Yes    Comment: occasionaly  . Drug use: Yes    Types: Marijuana    Comment: last use 05-19-2018  . Sexual activity: Yes    Comment: 1st intercourse 24 yrs-5 partners  Other Topics Concern  . Not on file  Social History Narrative   Lives with Rosalee Kaufman, daughter and grandson      Lives in a two story home.      Right handed      Highest level of education- 12 grade      Disabled      Social Determinants of Health   Financial Resource Strain: Low Risk   . Difficulty of Paying Living Expenses: Not hard at all  Food Insecurity: No Food Insecurity  . Worried About Charity fundraiser in the Last Year: Never true  . Ran Out of Food in the Last Year: Never true  Transportation Needs: No Transportation Needs  . Lack of Transportation (Medical): No  . Lack of Transportation (Non-Medical): No  Physical Activity: Sufficiently Active  . Days of Exercise per Week: 2 days  . Minutes of Exercise per Session: 120 min  Stress: No Stress Concern Present  . Feeling of Stress : Not at all  Social Connections: Socially Isolated  . Frequency of Communication with Friends and Family: Once a week  . Frequency of Social Gatherings with Friends and Family: Once a week  . Attends Religious Services: More than 4 times per year  . Active Member of Clubs or Organizations: No  . Attends Archivist Meetings: Never  . Marital Status: Never married    Tobacco Counseling Counseling given: Not Answered Comment: quit 04-19-2018   Clinical Intake:  Pre-visit preparation completed: Yes  Pain :  0-10 Pain Score: 7  Pain Type: Chronic pain Pain Location: Back Pain Orientation: Lower,Right Pain Onset: More than a month ago Pain Frequency: Constant Pain Relieving Factors: heating pad, brace  Pain Relieving Factors: heating pad, brace  Nutritional Risks: None Diabetes: No  How often do you need to have someone help you when you read instructions, pamphlets, or other written materials from your doctor or pharmacy?: 1 - Never What is the last grade level you completed in school?: 11th grade  Diabetic?No   Interpreter Needed?: No  Information entered by :: [place] of Daily Living In your present state of health, do you have any difficulty performing the following activities: 04/13/2020  Hearing? N  Vision? N  Difficulty concentrating or making decisions? N  Walking or climbing stairs? Y  Dressing or bathing? N  Doing errands, shopping? N  Preparing Food and eating ? N  Using the Toilet? N  In the past six months, have you accidently leaked urine? N  Do you have problems with loss of bowel control? N  Managing your Medications? N  Managing your Finances? N  Housekeeping or managing your Housekeeping? N  Some recent data might be hidden    Patient Care Team: [place], Betty G, MD as PCP - General (Family Medicine)  Indicate any recent Medical Services you may have received from other than [facility] providers in the past year (date may be approximate).     Assessment:   This is a routine wellness examination for [place].  Hearing/Vision screen  Hearing Screening   125Hz  250Hz  500Hz  1000Hz  2000Hz  3000Hz  4000Hz  6000Hz  8000Hz   Right ear:           Left ear:           Vision Screening Comments: Patient states gets eyes examined once per year.  Dietary issues and exercise activities discussed: Current Exercise Habits: Structured exercise class, Type of exercise: strength training/weights;walking, Time (Minutes): 60, Frequency (Times/Week): 4, Weekly  Exercise (Minutes/Week): 240, Intensity: Moderate  Goals    . Patient Stated     I will continue to go to the gym twice per week  for at least 2 hrs       Depression Screen PHQ 2/9 Scores 04/13/2020 06/02/2018 09/30/2017 08/09/2017  PHQ - 2 Score 0 0 2 0  PHQ- 9 Score 0 - - -    Fall Risk Fall Risk  04/13/2020 02/24/2019 01/28/2019 09/22/2018 06/02/2018  Falls in the past year? 0 0 0 0 0  Number falls in past yr: 0 - - 0 -  Injury with Fall? 0 - - 0 -  Risk for fall due to : Impaired balance/gait - - - -  Follow up Falls evaluation completed;Falls prevention discussed - - Falls evaluation completed -    FALL RISK PREVENTION PERTAINING TO THE HOME:  Any stairs in or around the home? No  If so, are there any without handrails? No  Home free of loose throw rugs in walkways, pet beds, electrical cords, etc? Yes  Adequate lighting in your home to reduce risk of falls? Yes   ASSISTIVE DEVICES UTILIZED TO PREVENT FALLS:  Life alert? No  Use of a cane, walker or w/c? Yes  Grab bars in the bathroom? No  Shower chair or bench in shower? No  Elevated toilet seat or a handicapped toilet? No     Cognitive Function:   Normal cognitive status assessed by direct observation by this Nurse Health Advisor. No abnormalities found.        Immunizations Immunization History  Administered Date(s) Administered  . Influenza,inj,Quad PF,6+ Mos 03/10/2018  . Influenza-Unspecified 01/17/2017    TDAP status: Due, Education has been provided regarding the importance of this vaccine. Advised may receive this vaccine at local pharmacy or Health Dept. Aware to provide a copy of the vaccination record if obtained from local pharmacy or Health Dept. Verbalized acceptance and understanding.  Flu Vaccine status: Due, Education has been provided regarding the importance of this vaccine. Advised may receive this vaccine at local pharmacy or Health Dept. Aware to provide a copy of the vaccination record if  obtained from local pharmacy or Health Dept. Verbalized acceptance and understanding.  Pneumococcal vaccine status: Up to  date  Covid-19 vaccine status: Completed vaccines  Qualifies for Shingles Vaccine? Yes   Zostavax completed No   Shingrix Completed?: No.    Education has been provided regarding the importance of this vaccine. Patient has been advised to call insurance company to determine out of pocket expense if they have not yet received this vaccine. Advised may also receive vaccine at local pharmacy or Health Dept. Verbalized acceptance and understanding.  Screening Tests Health Maintenance  Topic Date Due  . Hepatitis C Screening  Never done  . COVID-19 Vaccine (1) Never done  . HIV Screening  Never done  . TETANUS/TDAP  Never done  . COLONOSCOPY (Pts 45-30yrs Insurance coverage will need to be confirmed)  Never done  . INFLUENZA VACCINE  11/01/2019  . MAMMOGRAM  01/29/2021  . PAP SMEAR-Modifier  03/18/2021    Health Maintenance  Health Maintenance Due  Topic Date Due  . Hepatitis C Screening  Never done  . COVID-19 Vaccine (1) Never done  . HIV Screening  Never done  . TETANUS/TDAP  Never done  . COLONOSCOPY (Pts 45-26yrs Insurance coverage will need to be confirmed)  Never done  . INFLUENZA VACCINE  11/01/2019    Colorectal cancer screening: Referral to GI placed 04/13/2020. Pt aware the office will call re: appt.  Mammogram status: Ordered 04/13/2020. Pt provided with contact info and advised to call to schedule appt.   Bone Density Screening: Does not qualify at this age   Lung Cancer Screening: (Low Dose CT Chest recommended if Age 30-80 years, 30 pack-year currently smoking OR have quit w/in 15years.) does not qualify.   Lung Cancer Screening Referral: N/A   Additional Screening:  Hepatitis C Screening: does qualify; 0.    Vision Screening: Recommended annual ophthalmology exams for early detection of glaucoma and other disorders of the eye. Is the  patient up to date with their annual eye exam?  Yes  Who is the provider or what is the name of the office in which the patient attends annual eye exams? Dr.Groat  If pt is not established with a provider, would they like to be referred to a provider to establish care? No .   Dental Screening: Recommended annual dental exams for proper oral hygiene  Community Resource Referral / Chronic Care Management: CRR required this visit?  No   CCM required this visit?  No      Plan:     I have personally reviewed and noted the following in the patient's chart:   . Medical and social history . Use of alcohol, tobacco or illicit drugs  . Current medications and supplements . Functional ability and status . Nutritional status . Physical activity . Advanced directives . List of other physicians . Hospitalizations, surgeries, and ER visits in previous 12 months . Vitals . Screenings to include cognitive, depression, and falls . Referrals and appointments  In addition, I have reviewed and discussed with patient certain preventive protocols, quality metrics, and best practice recommendations. A written personalized care plan for preventive services as well as general preventive health recommendations were provided to patient.     Ofilia Neas, LPN   7/32/2025   Nurse Notes: None

## 2020-06-29 ENCOUNTER — Other Ambulatory Visit: Payer: Self-pay | Admitting: Family Medicine

## 2020-07-01 ENCOUNTER — Other Ambulatory Visit: Payer: Self-pay | Admitting: Family Medicine

## 2020-07-19 ENCOUNTER — Encounter: Payer: 59 | Admitting: Family Medicine

## 2020-07-26 ENCOUNTER — Other Ambulatory Visit: Payer: Self-pay

## 2020-07-26 ENCOUNTER — Encounter: Payer: Self-pay | Admitting: Family Medicine

## 2020-07-26 ENCOUNTER — Ambulatory Visit (INDEPENDENT_AMBULATORY_CARE_PROVIDER_SITE_OTHER): Payer: 59 | Admitting: Family Medicine

## 2020-07-26 VITALS — BP 150/100 | HR 91 | Temp 98.0°F | Resp 16 | Ht 62.0 in | Wt 154.0 lb

## 2020-07-26 DIAGNOSIS — Z13220 Encounter for screening for lipoid disorders: Secondary | ICD-10-CM | POA: Diagnosis not present

## 2020-07-26 DIAGNOSIS — M792 Neuralgia and neuritis, unspecified: Secondary | ICD-10-CM

## 2020-07-26 DIAGNOSIS — F319 Bipolar disorder, unspecified: Secondary | ICD-10-CM | POA: Diagnosis not present

## 2020-07-26 DIAGNOSIS — M542 Cervicalgia: Secondary | ICD-10-CM | POA: Diagnosis not present

## 2020-07-26 DIAGNOSIS — Z0000 Encounter for general adult medical examination without abnormal findings: Secondary | ICD-10-CM | POA: Diagnosis not present

## 2020-07-26 DIAGNOSIS — Z1159 Encounter for screening for other viral diseases: Secondary | ICD-10-CM

## 2020-07-26 DIAGNOSIS — R739 Hyperglycemia, unspecified: Secondary | ICD-10-CM

## 2020-07-26 DIAGNOSIS — Z1211 Encounter for screening for malignant neoplasm of colon: Secondary | ICD-10-CM

## 2020-07-26 DIAGNOSIS — I10 Essential (primary) hypertension: Secondary | ICD-10-CM

## 2020-07-26 MED ORDER — VENLAFAXINE HCL 37.5 MG PO TABS: ORAL_TABLET | ORAL | 1 refills | Status: DC

## 2020-07-26 MED ORDER — AMLODIPINE BESY-BENAZEPRIL HCL 5-20 MG PO CAPS: 1.0000 | ORAL_CAPSULE | Freq: Every day | ORAL | 1 refills | Status: DC

## 2020-07-26 MED ORDER — QUETIAPINE FUMARATE 100 MG PO TABS: 100.0000 mg | ORAL_TABLET | Freq: Every day | ORAL | 1 refills | Status: DC

## 2020-07-26 MED ORDER — GABAPENTIN 300 MG PO CAPS: 300.0000 mg | ORAL_CAPSULE | Freq: Three times a day (TID) | ORAL | 2 refills | Status: DC

## 2020-07-26 NOTE — Progress Notes (Signed)
HPI: Patricia [name] is a 50 y.o. female, who is here today for her routine physical.  Last CPE: 03/18/18.  Regular exercise 3 or more time per week: She is going to the [facility], walk and machines,pool exercise,and bike. Following a healthy diet: She decreased meal portions. She is not cooking most of the time. She works in [employer], so she eats their food. She lives alone. She has friends close by.  She does not sleep well, 4-5 hours interrupted. Smokes marijuana. Former smoker. Denies alcohol intake.  Chronic medical problems: Chronic pain, cervical and lumbar radiculopathy, HTN, and bipolar disorder among some. On disability due to chronic pain and psychiatrist disorders.  Pap smear: 03/18/18. S/P hysterectomy due to fibroid  Immunization History  Administered Date(s) Administered  . Influenza,inj,Quad PF,6+ Mos 03/10/2018  . Influenza-Unspecified 01/17/2017   Mammogram: 01/30/19 Colonoscopy: Never. DEXA: N/A  Hep C screening: Never.  Follow up and concerns today: She was last seen on 01/04/20.  HTN: BP elevated today and has been elevated at home. She is on Amlodipine-Benazepril 5-10 mg daily and HCTZ 25 mg daily. BP reading at home "high." She is taking medication as instructed. Negative for unusual/frequent headache,CP,or new focal neurologic deficit.  Bipolar disorder: She has not followed with her psychiatrist. Her health insurance changed and could not afford it. Last visit 01/2020. She has not taken her medications for a couple of months. States that in general she is doing fine , she has "good and bad days." She was on Effexor ER 37.5 mg daily and Seroquel 100 mg at bedtime. She feels like medication was helping. Denies suicidal thoughts.  Chronic pain: Cervical and lumbar with radiculopathy, and bilateral shoulder pain. She is not longer following with pain management. She smokes mariguana sometimes. She is on Gabapentin 300 mg tid, which is  still helping with radicular pain.  No hx of diabetes. Glucose has been elevated, 121.  Review of Systems  Constitutional: Positive for fatigue. Negative for appetite change and fever.  HENT: Negative for dental problem, hearing loss, mouth sores, sore throat and trouble swallowing.   Eyes: Negative for redness and visual disturbance.  Respiratory: Negative for cough, shortness of breath and wheezing.   Cardiovascular: Negative for palpitations and leg swelling.  Gastrointestinal: Negative for abdominal pain, nausea and vomiting.       No changes in bowel habits.  Endocrine: Negative for cold intolerance, heat intolerance, polydipsia, polyphagia and polyuria.  Genitourinary: Negative for decreased urine volume, dysuria, hematuria, vaginal bleeding and vaginal discharge.  Musculoskeletal: Positive for arthralgias, back pain and neck pain. Negative for gait problem and myalgias.  Skin: Negative for color change and rash.  Allergic/Immunologic: Positive for environmental allergies.  Neurological: Negative for syncope, facial asymmetry and weakness.  Hematological: Negative for adenopathy. Does not bruise/bleed easily.  Psychiatric/Behavioral: Positive for sleep disturbance. Negative for confusion and hallucinations. The patient is nervous/anxious.   All other systems reviewed and are negative.  Current Outpatient Medications on File Prior to Visit  Medication Sig Dispense Refill  . baclofen (LIORESAL) 10 MG tablet Take 1 tablet (10 mg total) by mouth daily as needed for muscle spasms. 60 each 2  . Multiple Vitamins-Minerals (MULTIVITAMIN WITH MINERALS) tablet Take 1 tablet by mouth daily.    . nicotine (NICODERM CQ - DOSED IN MG/24 HOURS) 14 mg/24hr patch Place 1 patch (14 mg total) onto the skin daily. 28 patch 1  . omeprazole (PRILOSEC) 20 MG capsule TAKE 1 CAPSULE(20 MG) BY MOUTH  DAILY BEFORE AND BREAKFAST 90 capsule 0  . ondansetron (ZOFRAN) 4 MG tablet Take 1 tablet (4 mg total) by  mouth every 6 (six) hours. 12 tablet 0  . sucralfate (CARAFATE) 1 g tablet Take 1 tablet (1 g total) by mouth 4 (four) times daily -  with meals and at bedtime for 15 days. (Patient not taking: Reported on 04/13/2020) 60 tablet 0   No current facility-administered medications on file prior to visit.   Past Medical History:  Diagnosis Date  . Abnormal perimenopausal bleeding   . Cervical dysplasia   . Depression   . Head trauma   . Pregnancy induced hypertension   . Rheumatoid arthritis ([place])   . Seizure disorder ([place])   . Shingles   . STD (sexually transmitted disease)    Gonorrhea   Past Surgical History:  Procedure Laterality Date  . BICEPS TENDON REPAIR    . CESAREAN SECTION    . DILATATION & CURETTAGE/HYSTEROSCOPY WITH MYOSURE N/A 05/23/2018   Procedure: DILATATION & CURETTAGE/HYSTEROSCOPY;  Surgeon: Anastasio Auerbach, MD;  Location: [place];  Service: Gynecology;  Laterality: N/A;  . SHOULDER SURGERY    . WISDOM TOOTH EXTRACTION      Allergies  Allergen Reactions  . Tramadol     Family History  Problem Relation Age of Onset  . Healthy Father   . Cancer Maternal Grandfather        Lung  . Healthy Mother     Social History   Socioeconomic History  . Marital status: Single    Spouse name: Not on file  . Number of children: Not on file  . Years of education: Not on file  . Highest education level: Not on file  Occupational History  . Not on file  Tobacco Use  . Smoking status: Former Smoker    Packs/day: 0.10    Years: 20.00    Pack years: 2.00    Start date: 08/31/2016  . Smokeless tobacco: Never Used  . Tobacco comment: quit 04-19-2018  Vaping Use  . Vaping Use: Never used  Substance and Sexual Activity  . Alcohol use: Yes    Comment: occasionaly  . Drug use: Yes    Types: Marijuana    Comment: last use 05-19-2018  . Sexual activity: Yes    Comment: 1st intercourse 14 yrs-5 partners  Other Topics Concern  . Not on file  Social  History Narrative   Lives with Rosalee Kaufman, daughter and grandson      Lives in a two story home.      Right handed      Highest level of education- 12 grade      Disabled      Social Determinants of Health   Financial Resource Strain: Low Risk   . Difficulty of Paying Living Expenses: Not hard at all  Food Insecurity: No Food Insecurity  . Worried About Charity fundraiser in the Last Year: Never true  . Ran Out of Food in the Last Year: Never true  Transportation Needs: No Transportation Needs  . Lack of Transportation (Medical): No  . Lack of Transportation (Non-Medical): No  Physical Activity: Sufficiently Active  . Days of Exercise per Week: 2 days  . Minutes of Exercise per Session: 120 min  Stress: No Stress Concern Present  . Feeling of Stress : Not at all  Social Connections: Socially Isolated  . Frequency of Communication with Friends and Family: Once a week  . Frequency of  Social Gatherings with Friends and Family: Once a week  . Attends Religious Services: More than 4 times per year  . Active Member of Clubs or Organizations: No  . Attends Archivist Meetings: Never  . Marital Status: Never married    Vitals:   07/26/20 1449  BP: (!) 150/100  Pulse: 91  Resp: 16  Temp: 98 F (36.7 C)  SpO2: 100%   Body mass index is 28.17 kg/m.  Wt Readings from Last 3 Encounters:  07/26/20 154 lb (69.9 kg)  01/04/20 167 lb (75.8 kg)  06/09/19 151 lb 6 oz (68.7 kg)   Physical Exam Vitals and nursing note reviewed.  Constitutional:      General: She is not in acute distress.    Appearance: She is well-developed.  HENT:     Head: Normocephalic and atraumatic.     Right Ear: Hearing, tympanic membrane, ear canal and external ear normal.     Left Ear: Hearing, tympanic membrane, ear canal and external ear normal.     Mouth/Throat:     Mouth: Mucous membranes are moist.     Pharynx: Oropharynx is clear. Uvula midline.  Eyes:     Extraocular Movements:  Extraocular movements intact.     Conjunctiva/sclera: Conjunctivae normal.     Pupils: Pupils are equal, round, and reactive to light.  Neck:     Thyroid: No thyromegaly.     Trachea: No tracheal deviation.  Cardiovascular:     Rate and Rhythm: Normal rate and regular rhythm.     Pulses:          Dorsalis pedis pulses are 2+ on the right side and 2+ on the left side.     Heart sounds: No murmur heard.   Pulmonary:     Effort: Pulmonary effort is normal. No respiratory distress.     Breath sounds: Normal breath sounds.  Chest:  Breasts:     Right: No axillary adenopathy or supraclavicular adenopathy.     Left: No axillary adenopathy or supraclavicular adenopathy.    Abdominal:     Palpations: Abdomen is soft. There is no hepatomegaly or mass.     Tenderness: There is no abdominal tenderness.  Genitourinary:    Comments: Breast: No masses,nipple discharge,or skin changes bilateral. Musculoskeletal:     Left shoulder: Decreased range of motion.     Cervical back: No bony tenderness. Pain with movement present. Decreased range of motion.     Comments: No major deformity or signs of synovitis appreciated.  Lymphadenopathy:     Cervical: No cervical adenopathy.     Upper Body:     Right upper body: No supraclavicular or axillary adenopathy.     Left upper body: No supraclavicular or axillary adenopathy.  Skin:    General: Skin is warm.     Findings: No erythema or rash.  Neurological:     Mental Status: She is alert and oriented to person, place, and time.     Cranial Nerves: No cranial nerve deficit.     Motor: Weakness (LUE 4/5) present. No tremor.     Gait: Gait normal.     Deep Tendon Reflexes:     Reflex Scores:      Bicep reflexes are 2+ on the right side and 2+ on the left side.      Patellar reflexes are 2+ on the right side and 2+ on the left side. Psychiatric:        Behavior: Behavior is not agitated.  Thought Content: Thought content does not include  suicidal ideation. Thought content does not include suicidal plan.     Comments: Well groomed, good eye contact.   ASSESSMENT AND PLAN:  Ms. MILLETTE HALBERSTAM was here today annual physical examination.  Orders Placed This Encounter  Procedures  . Hemoglobin A1c  . Hepatitis C antibody screen  . Lipid panel  . Ambulatory referral to Gastroenterology    Lab Results  Component Value Date   CHOL 189 07/26/2020   HDL 50.70 07/26/2020   LDLCALC 112 (H) 07/26/2020   TRIG 132.0 07/26/2020   CHOLHDL 4 07/26/2020   Lab Results  Component Value Date   HGBA1C 6.1 07/26/2020   Routine general medical examination at a health care facility We discussed the importance of regular physical activity and healthy diet for prevention of chronic illness and/or complications. Preventive guidelines reviewed. Vaccination up to date.  Ca++ and vit D supplementation recommended. Next CPE in a year.  The 10-year ASCVD risk score Mikey Bussing DC Brooke Bonito., et al., 2013) is: 7.3%   Values used to calculate the score:     Age: 23 years     Sex: Female     Is Non-Hispanic African American: Yes     Diabetic: No     Tobacco smoker: No     Systolic Blood Pressure: 096 mmHg     Is BP treated: Yes     HDL Cholesterol: 50.7 mg/dL     Total Cholesterol: 189 mg/dL  Hypertension, essential, benign BP is not adequately controlled. Benazepril dose increased from 10 mg to 20 mg. No changes in Amlodipine dose. Continue HCTZ 25 mg daily.  -     amLODipine-benazepril (LOTREL) 5-20 MG capsule; Take 1 capsule by mouth daily.  Bipolar 1 disorder, depressed (HCC) Medications were helping, no side effects. Resume Effexor and Seroquel at same dose. Instructed about warning signs.  -     venlafaxine (EFFEXOR) 37.5 MG tablet; TAKE 1 TABLET(37.5 MG) BY MOUTH DAILY -     QUEtiapine (SEROQUEL) 100 MG tablet; Take 1 tablet (100 mg total) by mouth at bedtime.  Neuropathic pain of lower extremity, unspecified  laterality Gabapentin 300 mg tid to continue. Not longer following with pain management.  -     gabapentin (NEURONTIN) 300 MG capsule; Take 1 capsule (300 mg total) by mouth 3 (three) times daily.  Cervicalgia Chronic. Gabapentin has helped with pain, so continue same dose.  -     gabapentin (NEURONTIN) 300 MG capsule; Take 1 capsule (300 mg total) by mouth 3 (three) times daily.  Hyperglycemia Healthy life style recommended for primary prevention.  Encounter for HCV screening test for low risk patient -     Hepatitis C antibody screen  Colon cancer screening -     Ambulatory referral to Gastroenterology  Screening for lipoid disorders -     Lipid panel   Return in 3 months (on 10/25/2020) for HTN, pain.   Vencent Hauschild G. [place], MD  [facility]. [place] office. Today you have you routine preventive visit. A few things to remember from today's visit:  Routine general medical examination at a health care facility  Hypertension, essential, benign - Plan: amLODipine-benazepril (LOTREL) 5-20 MG capsule  Bipolar 1 disorder, depressed ([place]), Chronic - Plan: venlafaxine (EFFEXOR) 37.5 MG tablet, QUEtiapine (SEROQUEL) 100 MG tablet  Neuropathic pain of lower extremity, unspecified laterality - Plan: gabapentin (NEURONTIN) 300 MG capsule  Cervicalgia - Plan: gabapentin (NEURONTIN) 300 MG capsule  Hyperglycemia - Plan:  Hemoglobin A1c  Encounter for HCV screening test for low risk patient - Plan: Hepatitis C antibody screen  Colon cancer screening - Plan: Ambulatory referral to Gastroenterology  Screening for lipoid disorders - Plan: Lipid panel  If you need refills please call your pharmacy. Do not use My Chart to request refills or for acute issues that need immediate attention.   Today blood pressure medication was adjusted, benazepril increased from 10 mg to 20 mg. No changes in hydrochlorothiazide or Amlodipine.  Please be sure medication list is accurate. If a  new problem present, please set up appointment sooner than planned today.  At least 150 minutes of moderate exercise per week, daily brisk walking for 15-30 min is a good exercise option. Healthy diet low in saturated (animal) fats and sweets and consisting of fresh fruits and vegetables, lean meats such as fish and white chicken and whole grains.  These are some of recommendations for screening depending of age and risk factors:  - Vaccines:  Tdap vaccine every 10 years. Better cover at your pharmacy.  Shingles vaccine recommended at age 69, could be given after 51 years of age but not sure about insurance coverage.   Pneumonia vaccines: Pneumovax at 37. Sometimes Pneumovax is giving earlier if history of smoking, lung disease,diabetes,kidney disease among some.  Screening for diabetes at age 3 and every 3 years.  Cervical cancer prevention:  N/A   -Breast cancer: Mammogram: There is disagreement between experts about when to start screening in low risk asymptomatic female but recent recommendations are to start screening at 7 and not later than 51 years old , every 1-2 years and after 51 yo q 2 years. Screening is recommended until 51 years old but some women can continue screening depending of healthy issues. Order was placed, please call [place] [phone].  Colon cancer screening: Has been recently changed to 50 yo. Insurance may not cover until you are 51 years old. Screening is recommended until 51 years old.  Cholesterol disorder screening at age 20 and every 3 years.  Also recommended:  1. Dental visit- Brush and floss your teeth twice daily; visit your dentist twice a year. 2. Eye doctor- Get an eye exam at least every 2 years. 3. Helmet use- Always wear a helmet when riding a bicycle, motorcycle, rollerblading or skateboarding. 4. Safe sex- If you may be exposed to sexually transmitted infections, use a condom. 5. Seat belts- Seat belts can save your live; always  wear one. 6. Smoke/Carbon Monoxide detectors- These detectors need to be installed on the appropriate level of your home. Replace batteries at least once a year. 7. Skin cancer- When out in the sun please cover up and use sunscreen 15 SPF or higher. 8. Violence- If anyone is threatening or hurting you, please tell your healthcare provider.  9. Drink alcohol in moderation- Limit alcohol intake to one drink or less per day. Never drink and drive. 10. Calcium supplementation 1000 to 1200 mg daily, ideally through your diet.  Vitamin D supplementation 800 units daily.

## 2020-07-26 NOTE — Patient Instructions (Addendum)
Today you have you routine preventive visit. A few things to remember from today's visit:  Routine general medical examination at a health care facility  Hypertension, essential, benign - Plan: amLODipine-benazepril (LOTREL) 5-20 MG capsule  Bipolar 1 disorder, depressed ([place]), Chronic - Plan: venlafaxine (EFFEXOR) 37.5 MG tablet, QUEtiapine (SEROQUEL) 100 MG tablet  Neuropathic pain of lower extremity, unspecified laterality - Plan: gabapentin (NEURONTIN) 300 MG capsule  Cervicalgia - Plan: gabapentin (NEURONTIN) 300 MG capsule  Hyperglycemia - Plan: Hemoglobin A1c  Encounter for HCV screening test for low risk patient - Plan: Hepatitis C antibody screen  Colon cancer screening - Plan: Ambulatory referral to Gastroenterology  Screening for lipoid disorders - Plan: Lipid panel  If you need refills please call your pharmacy. Do not use My Chart to request refills or for acute issues that need immediate attention.   Today blood pressure medication was adjusted, benazepril increased from 10 mg to 20 mg. No changes in hydrochlorothiazide or Amlodipine.  Please be sure medication list is accurate. If a new problem present, please set up appointment sooner than planned today.  At least 150 minutes of moderate exercise per week, daily brisk walking for 15-30 min is a good exercise option. Healthy diet low in saturated (animal) fats and sweets and consisting of fresh fruits and vegetables, lean meats such as fish and white chicken and whole grains.  These are some of recommendations for screening depending of age and risk factors:  - Vaccines:  Tdap vaccine every 10 years. Better cover at your pharmacy.  Shingles vaccine recommended at age 97, could be given after 51 years of age but not sure about insurance coverage.   Pneumonia vaccines: Pneumovax at 26. Sometimes Pneumovax is giving earlier if history of smoking, lung disease,diabetes,kidney disease among some.  Screening for  diabetes at age 107 and every 3 years.  Cervical cancer prevention:  N/A   -Breast cancer: Mammogram: There is disagreement between experts about when to start screening in low risk asymptomatic female but recent recommendations are to start screening at 79 and not later than 51 years old , every 1-2 years and after 51 yo q 2 years. Screening is recommended until 51 years old but some women can continue screening depending of healthy issues. Order was placed, please call [place] 336 [phone].  Colon cancer screening: Has been recently changed to 50 yo. Insurance may not cover until you are 51 years old. Screening is recommended until 51 years old.  Cholesterol disorder screening at age 37 and every 3 years.  Also recommended:  1. Dental visit- Brush and floss your teeth twice daily; visit your dentist twice a year. 2. Eye doctor- Get an eye exam at least every 2 years. 3. Helmet use- Always wear a helmet when riding a bicycle, motorcycle, rollerblading or skateboarding. 4. Safe sex- If you may be exposed to sexually transmitted infections, use a condom. 5. Seat belts- Seat belts can save your live; always wear one. 6. Smoke/Carbon Monoxide detectors- These detectors need to be installed on the appropriate level of your home. Replace batteries at least once a year. 7. Skin cancer- When out in the sun please cover up and use sunscreen 15 SPF or higher. 8. Violence- If anyone is threatening or hurting you, please tell your healthcare provider.  9. Drink alcohol in moderation- Limit alcohol intake to one drink or less per day. Never drink and drive. 10. Calcium supplementation 1000 to 1200 mg daily, ideally through your diet.  Vitamin D supplementation 800 units daily.

## 2020-07-27 LAB — LIPID PANEL
Cholesterol: 189 mg/dL (ref 0–200)
HDL: 50.7 mg/dL (ref >39.00)
LDL Cholesterol: 112 mg/dL — ABNORMAL HIGH (ref 0–99)
NonHDL: 138.5
Total CHOL/HDL Ratio: 4
Triglycerides: 132 mg/dL (ref 0.0–149.0)
VLDL: 26.4 mg/dL (ref 0.0–40.0)

## 2020-07-27 LAB — HEPATITIS C ANTIBODY
Hepatitis C Ab: NONREACTIVE
SIGNAL TO CUT-OFF: 0.01 (ref <1.00)

## 2020-07-27 LAB — HEMOGLOBIN A1C: Hgb A1c MFr Bld: 6.1 % (ref 4.6–6.5)

## 2020-07-30 MED ORDER — HYDROCHLOROTHIAZIDE 25 MG PO TABS: ORAL_TABLET | ORAL | 2 refills | Status: DC

## 2020-09-06 ENCOUNTER — Other Ambulatory Visit: Payer: Self-pay

## 2020-09-06 ENCOUNTER — Ambulatory Visit
Admission: RE | Admit: 2020-09-06 | Discharge: 2020-09-06 | Disposition: A | Discharge status: Home / Self Care | Payer: 59 | Source: Ambulatory Visit | Attending: Family Medicine | Admitting: Family Medicine

## 2020-09-06 DIAGNOSIS — Z1231 Encounter for screening mammogram for malignant neoplasm of breast: Secondary | ICD-10-CM

## 2020-09-14 ENCOUNTER — Telehealth: Payer: Self-pay | Admitting: Family Medicine

## 2020-09-14 NOTE — Telephone Encounter (Signed)
Dr. Maryjane Hurter is calling from [facility] and wanted to speak to the provider and discuss patients functioning limitations for when returning to return, please advise. CB [phone]

## 2020-09-16 ENCOUNTER — Telehealth: Payer: Self-pay | Admitting: Family Medicine

## 2020-09-16 NOTE — Telephone Encounter (Signed)
Refer to reference #[id number]--LPN can talk to anyone at [place].  They want to know if records release was received by fax on 09/15/20--they want to know turnaround time on receiving patient's records.

## 2020-09-19 NOTE — Telephone Encounter (Signed)
I called and spoke with [employer], made them aware a records released form was not received by fax. They will re-fax it.

## 2020-09-19 NOTE — Telephone Encounter (Signed)
Form is in pcp's basket to be signed & faxed back.

## 2020-10-04 ENCOUNTER — Ambulatory Visit: Payer: 59 | Admitting: Family Medicine

## 2020-10-30 ENCOUNTER — Other Ambulatory Visit: Payer: Self-pay | Admitting: Family Medicine

## 2020-11-07 ENCOUNTER — Other Ambulatory Visit: Payer: Self-pay | Admitting: Family Medicine

## 2020-11-07 DIAGNOSIS — M792 Neuralgia and neuritis, unspecified: Secondary | ICD-10-CM

## 2020-11-07 DIAGNOSIS — M542 Cervicalgia: Secondary | ICD-10-CM

## 2020-12-01 ENCOUNTER — Other Ambulatory Visit: Payer: Self-pay | Admitting: Family Medicine

## 2020-12-01 DIAGNOSIS — I10 Essential (primary) hypertension: Secondary | ICD-10-CM

## 2020-12-13 ENCOUNTER — Encounter: Payer: Self-pay | Admitting: Family Medicine

## 2020-12-13 ENCOUNTER — Other Ambulatory Visit: Payer: Self-pay

## 2020-12-13 ENCOUNTER — Ambulatory Visit (INDEPENDENT_AMBULATORY_CARE_PROVIDER_SITE_OTHER): Payer: Medicare HMO | Admitting: Family Medicine

## 2020-12-13 VITALS — BP 140/80 | HR 93 | Resp 16 | Ht 62.0 in | Wt 152.5 lb

## 2020-12-13 DIAGNOSIS — G8929 Other chronic pain: Secondary | ICD-10-CM

## 2020-12-13 DIAGNOSIS — M545 Low back pain, unspecified: Secondary | ICD-10-CM

## 2020-12-13 DIAGNOSIS — M792 Neuralgia and neuritis, unspecified: Secondary | ICD-10-CM | POA: Diagnosis not present

## 2020-12-13 DIAGNOSIS — Z23 Encounter for immunization: Secondary | ICD-10-CM | POA: Diagnosis not present

## 2020-12-13 DIAGNOSIS — I10 Essential (primary) hypertension: Secondary | ICD-10-CM | POA: Diagnosis not present

## 2020-12-13 DIAGNOSIS — F319 Bipolar disorder, unspecified: Secondary | ICD-10-CM

## 2020-12-13 DIAGNOSIS — R7303 Prediabetes: Secondary | ICD-10-CM | POA: Diagnosis not present

## 2020-12-13 MED ORDER — MELOXICAM 15 MG PO TABS
15.0000 mg | ORAL_TABLET | Freq: Every day | ORAL | 3 refills | Status: DC | PRN
Start: 2020-12-13 — End: 2021-05-09

## 2020-12-13 MED ORDER — QUETIAPINE FUMARATE 100 MG PO TABS: 50.0000 mg | ORAL_TABLET | Freq: Every day | ORAL | 1 refills | Status: DC

## 2020-12-13 NOTE — Patient Instructions (Addendum)
A few things to remember from today's visit: Hypertension, essential, benign - Plan: Comprehensive metabolic panel  Bipolar 1 disorder, depressed (HCC)  Neuropathic pain of lower extremity, unspecified laterality  Prediabetes - Plan: Hemoglobin A1c  Bipolar 1 disorder, depressed ([place]), Chronic - Plan: QUEtiapine (SEROQUEL) 100 MG tablet  If you need refills please call your pharmacy. Do not use My Chart to request refills or for acute issues that need immediate attention.   Seroquel 1/2 tab for 4 weeks then every other day for 3 weeks and stop. No changes in Effexor. Establish with psychiatrist as we discussed. Today Meloxicam 15 mg daily as needed added. Acetaminophen 500 mg 3-4 times day. Continue low impact exercise.   Please be sure medication list is accurate. If a new problem present, please set up appointment sooner than planned today.

## 2020-12-13 NOTE — Assessment & Plan Note (Addendum)
Healthier life style habits encouraged for diabetes prevention. Further recommendations according to HgA1C result.

## 2020-12-13 NOTE — Progress Notes (Signed)
HPI: Patricia [name] is a 50 y.o. female, who is here today for 5 months follow up.   She was last seen on 07/26/20.  Hypertension:  Medications:HCTZ 25 mg daily and Amlodipine-Benazepril 5-20 mg daily. BP readings at home:130's/80's most of the time. Mildly elevated when she has back pain. Side effects:None.  Negative for unusual or severe headache, visual changes, exertional chest pain, dyspnea,  focal weakness, or edema.  Lab Results  Component Value Date   CREATININE 0.61 01/04/2020   BUN 10 01/04/2020   NA 141 01/04/2020   K 4.1 01/04/2020   CL 106 01/04/2020   CO2 29 01/04/2020   She feels like Seroquel is causing nausea. She has been on same medication for over a year. She has taken Zofran for nausea prn. She is on Effexor 37.5 mg daily. Insomnia: She wakes up a few times through the night. Denies suicidal thoughts.  She has not been exercising regularly, she is going to the gyn a few times per week. Not consistent with following a healthful diet.  Upper and lower back pain radiated to left UE and LE's respectively. LE's burning like sensation has improved with Gabapentin, she is taking 300 mg tid. Left shoulder limitation of ROM. Lower back pain has been worse for the past few months. No hx of trauma or unusual physical activity. Tried Cymbalta years ago, did not help with pain.  She is not longer following with pain management. She wonders if cannabis can be effective in treating chronic pain.  Review of Systems  Constitutional:  Negative for activity change, appetite change, fatigue and fever.  HENT:  Negative for mouth sores, nosebleeds and sore throat.   Respiratory:  Negative for cough and wheezing.   Gastrointestinal:  Negative for abdominal pain and vomiting.       Negative for changes in bowel habits.  Genitourinary:  Negative for decreased urine volume, dysuria and hematuria.  Musculoskeletal:  Positive for back pain and neck pain. Negative for  gait problem.  Skin:  Negative for wound.  Neurological:  Negative for syncope, weakness and headaches.  Psychiatric/Behavioral:  Positive for sleep disturbance. Negative for confusion and hallucinations.   Rest of ROS, see pertinent positives sand negatives in HPI  Current Outpatient Medications on File Prior to Visit  Medication Sig Dispense Refill   amLODipine-benazepril (LOTREL) 5-20 MG capsule Take 1 capsule by mouth daily. 90 capsule 1   baclofen (LIORESAL) 10 MG tablet TAKE 1 TABLET(10 MG) BY MOUTH DAILY AS NEEDED FOR MUSCLE SPASMS 60 tablet 1   gabapentin (NEURONTIN) 300 MG capsule TAKE 1 CAPSULE(300 MG) BY MOUTH THREE TIMES DAILY 90 capsule 1   hydrochlorothiazide (HYDRODIURIL) 25 MG tablet TAKE 1 TABLET(25 MG) BY MOUTH DAILY 90 tablet 2   Multiple Vitamins-Minerals (MULTIVITAMIN WITH MINERALS) tablet Take 1 tablet by mouth daily.     nicotine (NICODERM CQ - DOSED IN MG/24 HOURS) 14 mg/24hr patch Place 1 patch (14 mg total) onto the skin daily. 28 patch 1   omeprazole (PRILOSEC) 20 MG capsule TAKE 1 CAPSULE(20 MG) BY MOUTH DAILY BEFORE AND BREAKFAST 90 capsule 0   ondansetron (ZOFRAN) 4 MG tablet Take 1 tablet (4 mg total) by mouth every 6 (six) hours. 12 tablet 0   venlafaxine (EFFEXOR) 37.5 MG tablet TAKE 1 TABLET(37.5 MG) BY MOUTH DAILY 90 tablet 1   sucralfate (CARAFATE) 1 g tablet Take 1 tablet (1 g total) by mouth 4 (four) times daily -  with meals and at  bedtime for 15 days. (Patient not taking: Reported on 04/13/2020) 60 tablet 0   No current facility-administered medications on file prior to visit.   Past Medical History:  Diagnosis Date   Abnormal perimenopausal bleeding    Cervical dysplasia    Depression    Head trauma    Pregnancy induced hypertension    Rheumatoid arthritis ([place])    Seizure disorder ([place])    Shingles    STD (sexually transmitted disease)    Gonorrhea   Allergies  Allergen Reactions   Tramadol     Social History   Socioeconomic History    Marital status: Single    Spouse name: Not on file   Number of children: Not on file   Years of education: Not on file   Highest education level: Not on file  Occupational History   Not on file  Tobacco Use   Smoking status: Former    Packs/day: 0.10    Years: 20.00    Pack years: 2.00    Types: Cigarettes    Start date: 08/31/2016   Smokeless tobacco: Never   Tobacco comments:    quit 04-19-2018  Vaping Use   Vaping Use: Never used  Substance and Sexual Activity   Alcohol use: Yes    Comment: occasionaly   Drug use: Yes    Types: Marijuana    Comment: last use 05-19-2018   Sexual activity: Yes    Comment: 1st intercourse 52 yrs-5 partners  Other Topics Concern   Not on file  Social History Narrative   Lives with godson, daughter and grandson      Lives in a two story home.      Right handed      Highest level of education- 12 grade      Disabled      Social Determinants of Health   Financial Resource Strain: Low Risk    Difficulty of Paying Living Expenses: Not hard at all  Food Insecurity: No Food Insecurity   Worried About Charity fundraiser in the Last Year: Never true   Arboriculturist in the Last Year: Never true  Transportation Needs: No Transportation Needs   Lack of Transportation (Medical): No   Lack of Transportation (Non-Medical): No  Physical Activity: Sufficiently Active   Days of Exercise per Week: 2 days   Minutes of Exercise per Session: 120 min  Stress: No Stress Concern Present   Feeling of Stress : Not at all  Social Connections: Socially Isolated   Frequency of Communication with Friends and Family: Once a week   Frequency of Social Gatherings with Friends and Family: Once a week   Attends Religious Services: More than 4 times per year   Active Member of [employer] or Organizations: No   Attends Archivist Meetings: Never   Marital Status: Never married   Vitals:   12/13/20 1508  BP: 140/80  Pulse: 93  Resp: 16  SpO2: 99%    Wt Readings from Last 3 Encounters:  12/13/20 152 lb 8 oz (69.2 kg)  07/26/20 154 lb (69.9 kg)  01/04/20 167 lb (75.8 kg)   Body mass index is 27.89 kg/m.  Physical Exam Vitals and nursing note reviewed.  Constitutional:      General: She is not in acute distress.    Appearance: She is well-developed.  HENT:     Head: Normocephalic and atraumatic.     Mouth/Throat:     Mouth: Mucous membranes are moist.  Dentition: Has dentures.     Pharynx: Oropharynx is clear.  Eyes:     Conjunctiva/sclera: Conjunctivae normal.  Cardiovascular:     Rate and Rhythm: Normal rate and regular rhythm.     Pulses:          Dorsalis pedis pulses are 2+ on the right side and 2+ on the left side.     Heart sounds: No murmur heard. Pulmonary:     Effort: Pulmonary effort is normal. No respiratory distress.     Breath sounds: Normal breath sounds.  Abdominal:     Palpations: Abdomen is soft. There is no hepatomegaly or mass.     Tenderness: There is no abdominal tenderness.  Musculoskeletal:     Lumbar back: Tenderness present. No bony tenderness. Negative right straight leg raise test and negative left straight leg raise test.  Lymphadenopathy:     Cervical: No cervical adenopathy.  Skin:    General: Skin is warm.     Findings: No erythema or rash.  Neurological:     General: No focal deficit present.     Mental Status: She is alert and oriented to person, place, and time.     Cranial Nerves: No cranial nerve deficit.     Comments: Antalgic gait, not assisted.  Psychiatric:     Comments: Well groomed, good eye contact.   ASSESSMENT AND PLAN:  Patricia [name] was seen today for 5 months follow-up.  Orders Placed This Encounter  Procedures   Flu Vaccine QUAD 41moIM (Fluarix, Fluzone & Alfiuria Quad PF)   Hemoglobin A1c   Comprehensive metabolic panel   Lab Results  Component Value Date   HGBA1C 6.2 12/13/2020   Lab Results  Component Value Date   CREATININE 0.72  12/13/2020   BUN 12 12/13/2020   NA 140 12/13/2020   K 3.6 12/13/2020   CL 104 12/13/2020   CO2 27 12/13/2020   Lab Results  Component Value Date   ALT 14 12/13/2020   AST 14 12/13/2020   ALKPHOS 86 12/13/2020   BILITOT 0.4 12/13/2020   Neuropathic pain of lower extremity, unspecified laterality Continue Gabapentin 300 mg tid. Appropriate skin care discussed.  Need for influenza vaccination -     Flu Vaccine QUAD 661moM (Fluarix, Fluzone & Alfiuria Quad PF)  Bipolar 1 disorder, depressed (HCC) Seroquel to wean off because side effects. She is planning on re-establish care with [facility]. Continue Effexor XR same dose. Instructed about warning signs.  Hypertension, essential, benign Home BP readings at home, most adequately controlled. Continue HCTZ 25 mg daily and Amlodipine-Benazepril 5-20 mg daily. Continue monitoring BP regularly. Low salt diet recommended.  Lower back pain Having more pain for the past few days, no hx of trauma; so I do not think imaging is needed at this time. Meloxicam 15 mg daily prn added today. Some side effects discussed.  Prediabetes Healthier life style habits encouraged for diabetes prevention. Further recommendations according to HgA1C result.   Return in about 4 months (around 04/14/2021).   Argie Lober G. JoMartiniqueMD  [facility].

## 2020-12-13 NOTE — Assessment & Plan Note (Signed)
Seroquel to wean off because side effects. She is planning on re-establish care with [facility]. Continue Effexor XR same dose. Instructed about warning signs.

## 2020-12-13 NOTE — Assessment & Plan Note (Addendum)
Having more pain for the past few days, no hx of trauma; so I do not think imaging is needed at this time. Meloxicam 15 mg daily prn added today. Some side effects discussed.

## 2020-12-13 NOTE — Assessment & Plan Note (Addendum)
Home BP readings at home, most adequately controlled. Continue HCTZ 25 mg daily and Amlodipine-Benazepril 5-20 mg daily. Continue monitoring BP regularly. Low salt diet recommended.

## 2020-12-14 LAB — COMPREHENSIVE METABOLIC PANEL
ALT: 14 U/L (ref 0–35)
AST: 14 U/L (ref 0–37)
Albumin: 4.2 g/dL (ref 3.5–5.2)
Alkaline Phosphatase: 86 U/L (ref 39–117)
BUN: 12 mg/dL (ref 6–23)
CO2: 27 mEq/L (ref 19–32)
Calcium: 9.6 mg/dL (ref 8.4–10.5)
Chloride: 104 mEq/L (ref 96–112)
Creatinine, Ser: 0.72 mg/dL (ref 0.40–1.20)
GFR: 96.77 mL/min (ref >60.00)
Glucose, Bld: 115 mg/dL — ABNORMAL HIGH (ref 70–99)
Potassium: 3.6 mEq/L (ref 3.5–5.1)
Sodium: 140 mEq/L (ref 135–145)
Total Bilirubin: 0.4 mg/dL (ref 0.2–1.2)
Total Protein: 7.4 g/dL (ref 6.0–8.3)

## 2020-12-14 LAB — HEMOGLOBIN A1C: Hgb A1c MFr Bld: 6.2 % (ref 4.6–6.5)

## 2021-01-09 ENCOUNTER — Telehealth: Payer: Self-pay | Admitting: Family Medicine

## 2021-01-09 ENCOUNTER — Telehealth: Payer: Self-pay

## 2021-01-09 DIAGNOSIS — M542 Cervicalgia: Secondary | ICD-10-CM

## 2021-01-09 DIAGNOSIS — G8929 Other chronic pain: Secondary | ICD-10-CM

## 2021-01-09 DIAGNOSIS — M545 Low back pain, unspecified: Secondary | ICD-10-CM

## 2021-01-09 DIAGNOSIS — M792 Neuralgia and neuritis, unspecified: Secondary | ICD-10-CM

## 2021-01-09 NOTE — Telephone Encounter (Signed)
I see that a referral for pain management has been placed, so she can wait until she sees a provider or try methocarbamol 500 mg 3 times daily as needed (#45/0).  Similar side effects than baclofen in regards to drowsiness. Thanks, BJ

## 2021-01-09 NOTE — Telephone Encounter (Signed)
Patient called asking for a referral to a pain management clinic pt has new insurance.  Pt would like a call back.

## 2021-01-09 NOTE — Addendum Note (Signed)
Addended by: Rodrigo Ran on: 01/09/2021 04:56 PM   Modules accepted: Orders

## 2021-01-09 NOTE — Telephone Encounter (Signed)
I called and spoke with patient. She is requesting a pain management referral. Advised I would send her referral to the [facility] office to see if they accept her insurance.

## 2021-01-09 NOTE — Telephone Encounter (Signed)
Patient called stating that she was prescribed baclofen (LIORESAL) 10 MG tablet for her muscle spasms. She says that the medication is no longer working for her. She says that she is not sure if it is because she has been taking it for a while but she is starting to feel spasms in her vaginal area.  Please advise.

## 2021-01-10 MED ORDER — METHOCARBAMOL 500 MG PO TABS: 500.0000 mg | ORAL_TABLET | Freq: Three times a day (TID) | ORAL | 0 refills | Status: DC

## 2021-01-10 NOTE — Addendum Note (Signed)
Addended by: Rodrigo Ran on: 01/10/2021 09:55 AM   Modules accepted: Orders

## 2021-01-10 NOTE — Telephone Encounter (Signed)
I called and spoke with patient. She is aware a new Rx has been sent in. She is also aware that her referral was updated to go to [facility].

## 2021-01-10 NOTE — Telephone Encounter (Signed)
Patient called back to state that she reached out and spoke with someone from [facility] regarding pain management. Patient wanted to provide the following information:  - Phone number is [phone].  - Physicians are Dr.Foster and Dr. Simona Huh.  Patient could be contacted at [phone].  Please advise.

## 2021-01-10 NOTE — Telephone Encounter (Signed)
Referral updated

## 2021-01-11 NOTE — Progress Notes (Signed)
error 

## 2021-01-16 ENCOUNTER — Telehealth: Payer: Self-pay | Admitting: Family Medicine

## 2021-01-16 DIAGNOSIS — M542 Cervicalgia: Secondary | ICD-10-CM

## 2021-01-16 DIAGNOSIS — M545 Low back pain, unspecified: Secondary | ICD-10-CM

## 2021-01-16 DIAGNOSIS — M792 Neuralgia and neuritis, unspecified: Secondary | ICD-10-CM

## 2021-01-16 DIAGNOSIS — G8929 Other chronic pain: Secondary | ICD-10-CM

## 2021-01-16 NOTE — Telephone Encounter (Signed)
Pt is calling and does not want to go to [facility]. Pt would like to go to [facility] on [address] for her neck and back pain. Pt stated she seen chiropractic and was told her spine is not align. [facility] phone number is [phone] and fax number [phone]. Please advise

## 2021-01-17 NOTE — Telephone Encounter (Signed)
New referral entered 

## 2021-02-02 ENCOUNTER — Other Ambulatory Visit: Payer: Self-pay | Admitting: Family Medicine

## 2021-02-02 DIAGNOSIS — F319 Bipolar disorder, unspecified: Secondary | ICD-10-CM

## 2021-02-08 ENCOUNTER — Other Ambulatory Visit: Payer: Self-pay | Admitting: Family Medicine

## 2021-04-14 ENCOUNTER — Ambulatory Visit: Payer: 59

## 2021-04-18 ENCOUNTER — Telehealth: Payer: Self-pay

## 2021-04-18 NOTE — Telephone Encounter (Signed)
Unsuccessful attempt to reach patient for scheduled AWV. On preferred number listed in notes. Left message on voicemail okay to reschedule.

## 2021-05-08 NOTE — Progress Notes (Signed)
Chief Complaint  Patient presents with   Medication Refill    Baclofen is not helping, pt had yearly medicare exam and was told to inquire about Cymbalta.   HPI:  Ms.Daira ARLESIA KIEL is a 51 y.o. female, who is here today to follow on medication. She was last seen on 12/13/20. No new problems since her last visit.  Hypertension:  Medications:Amlodipine-Benazepril 5-20 mg daily and HCTZ 25 mg daily. BP readings at home:Occasionally, yesterday 140's/80's. Side effects:None. Negative for unusual or severe headache, visual changes, exertional chest pain, dyspnea,  focal weakness, or edema.  Lab Results  Component Value Date   CREATININE 0.72 12/13/2020   BUN 12 12/13/2020   NA 140 12/13/2020   K 3.6 12/13/2020   CL 104 12/13/2020   CO2 27 12/13/2020   Chronic pain. Baclofen is not helping with back pain. +Lower and upper back pain. Sometimes lower back pain is radiated to LE's and burning sensation on lower back and LE's with tingling. Left-sided upper back and cervical pain, LUE numbness.  In 12/2020 Methocarbamol was prescribed and it seemed to help but her pharmacy still filling Baclofen, which was discontinued because lack of effectiveness.  Gabapentin has helped greatly with lower extremity burning sensation, she is on 300 mg bid. S/P branch radiofrequency neurotomy on the fluoroscopic guidance on 07/28/2018.  She has established with pain management, following monthly. States that she just had back MRI done and pending report. Following with provider at [facility]. She is on Hydrocodone-Acetaminophen 5-325 mg 1-2 times per day.  She is also seeing chiropractor weekly. Requesting a rx for a walker and handicap sticker. Lower back pain gets worse with prolonged standing and walking. Alleviated by leaning against something when walking.  Smoking 6-7 cig per day. She would like a Rx for nicotine patches.  She drinks "a lot" of regular soda. Trying  to avoid fried foods.  Bipolar disorder: She is still taking Seroquel 100 mg 1/2 tab daily. She is not longer taking Effexor, it caused nausea. She was trying to re-establish with former psychiatrist but log waiting hours, so we would like to try a new provider.  Review of Systems  Constitutional:  Positive for fatigue. Negative for activity change, appetite change and fever.  HENT:  Negative for mouth sores, nosebleeds and sore throat.   Respiratory:  Negative for cough and wheezing.   Gastrointestinal:  Negative for abdominal pain, nausea and vomiting.       Negative for changes in bowel habits.  Genitourinary:  Negative for decreased urine volume, dysuria and hematuria.  Neurological:  Negative for syncope, facial asymmetry and weakness.  Psychiatric/Behavioral:  Negative for confusion and hallucinations.   Rest see pertinent positives and negatives per HPI.  Current Outpatient Medications on File Prior to Visit  Medication Sig Dispense Refill   amLODipine-benazepril (LOTREL) 5-20 MG capsule Take 1 capsule by mouth daily. 90 capsule 1   hydrochlorothiazide (HYDRODIURIL) 25 MG tablet TAKE 1 TABLET(25 MG) BY MOUTH DAILY 90 tablet 2   Multiple Vitamins-Minerals (MULTIVITAMIN WITH MINERALS) tablet Take 1 tablet by mouth daily.     omeprazole (PRILOSEC) 20 MG capsule TAKE 1 CAPSULE(20 MG) BY MOUTH DAILY BEFORE AND BREAKFAST 90 capsule 0   ondansetron (ZOFRAN) 4 MG tablet Take 1 tablet (4 mg total) by mouth every 6 (six) hours. 12 tablet 0   QUEtiapine (SEROQUEL) 100 MG tablet Take 0.5 tablets (50 mg total) by mouth at bedtime. 90 tablet 1   No current  facility-administered medications on file prior to visit.   Past Medical History:  Diagnosis Date   Abnormal perimenopausal bleeding    Cervical dysplasia    Depression    Head trauma    Pregnancy induced hypertension    Rheumatoid arthritis ([place])    Seizure disorder ([place])    Shingles    STD (sexually transmitted disease)     Gonorrhea   Allergies  Allergen Reactions   Tramadol     Social History   Socioeconomic History   Marital status: Single    Spouse name: Not on file   Number of children: Not on file   Years of education: Not on file   Highest education level: Not on file  Occupational History   Not on file  Tobacco Use   Smoking status: Former    Packs/day: 0.10    Years: 20.00    Pack years: 2.00    Types: Cigarettes    Start date: 08/31/2016   Smokeless tobacco: Never   Tobacco comments:    quit 04-19-2018  Vaping Use   Vaping Use: Never used  Substance and Sexual Activity   Alcohol use: Yes    Comment: occasionaly   Drug use: Yes    Types: Marijuana    Comment: last use 05-19-2018   Sexual activity: Yes    Comment: 1st intercourse 54 yrs-5 partners  Other Topics Concern   Not on file  Social History Narrative   Lives with godson, daughter and grandson      Lives in a two story home.      Right handed      Highest level of education- 12 grade      Disabled      Social Determinants of Health   Financial Resource Strain: Not on file  Food Insecurity: Not on file  Transportation Needs: Not on file  Physical Activity: Not on file  Stress: Not on file  Social Connections: Not on file   Vitals:   05/09/21 1020  BP: 120/80  Pulse: 90  Resp: 16  SpO2: 99%   Wt Readings from Last 3 Encounters:  05/09/21 155 lb 6 oz (70.5 kg)  12/13/20 152 lb 8 oz (69.2 kg)  07/26/20 154 lb (69.9 kg)   Body mass index is 28.42 kg/m.  Physical Exam Vitals and nursing note reviewed.  Constitutional:      General: She is not in acute distress.    Appearance: She is well-developed.  HENT:     Head: Normocephalic and atraumatic.     Mouth/Throat:     Mouth: Mucous membranes are moist.     Pharynx: Oropharynx is clear.  Eyes:     Conjunctiva/sclera: Conjunctivae normal.  Cardiovascular:     Rate and Rhythm: Normal rate and regular rhythm.     Pulses:          Posterior tibial  pulses are 2+ on the right side and 2+ on the left side.     Heart sounds: No murmur heard. Pulmonary:     Effort: Pulmonary effort is normal. No respiratory distress.     Breath sounds: Normal breath sounds.  Abdominal:     Palpations: Abdomen is soft. There is no hepatomegaly or mass.     Tenderness: There is no abdominal tenderness.  Musculoskeletal:     Cervical back: Spasms and tenderness present. Decreased range of motion.     Thoracic back: Tenderness present.     Lumbar back: No tenderness or bony  tenderness.       Back:  Lymphadenopathy:     Cervical: No cervical adenopathy.  Skin:    General: Skin is warm.     Findings: No erythema or rash.  Neurological:     General: No focal deficit present.     Mental Status: She is alert and oriented to person, place, and time.     Cranial Nerves: No cranial nerve deficit.     Comments: Stable gait with no assistance.  Psychiatric:     Comments: Well groomed, good eye contact.   ASSESSMENT AND PLAN:  [name] was seen today for medication refill.  Diagnoses and all orders for this visit: Orders Placed This Encounter  Procedures   Basic metabolic panel   Hemoglobin A1c   Ambulatory referral to Gastroenterology   Lab Results  Component Value Date   CREATININE 0.63 05/09/2021   BUN 12 05/09/2021   NA 140 05/09/2021   K 3.7 05/09/2021   CL 102 05/09/2021   CO2 33 (H) 05/09/2021   Lab Results  Component Value Date   HGBA1C 6.1 05/09/2021   Hypertension, essential, benign BP adequately controlled. Continue Amlodipine-Benazepril 5-20 mg daily and HCTZ 25 mg daily. Low salt/DASH diet recommended. Monitor BP at home.  Bipolar 1 disorder, depressed (HCC) Continue Seroquel 100 mg 1/2 tab daily to continue. Duloxetine 30 mg started today. Instructed about warning signs. List of psychiatrist in the area with contact information given today.  Tobacco use disorder Encouraged smoking cessation.  We discussed adverse  effects of tobacco use. Nicotine patches 14 mg/24 hours refill sent.  Prediabetes We reviewed healthy life style habits for diabetes prevention. Further recommendations according to HgA1C result.  Cervicalgia Chronic. Methocarbamol helped some, so refill sent. Side effects discussed. Duloxetine may help, some side effects discussed. Following with pain management.   Neuropathic pain of lower extremity Continue gabapentin 300 mg bid. Following with pain pin management. Lumbar MRI result pending, per pt report.  Colon cancer screening -     Ambulatory referral to Gastroenterology  Nicotine dependence, cigarettes, in remission -     nicotine (NICODERM CQ - DOSED IN MG/24 HOURS) 14 mg/24hr patch; Place 1 patch (14 mg total) onto the skin daily.  I spent a total of 45 minutes in both face to face and non face to face activities for this visit on the date of this encounter. During this time history was obtained and documented, examination was performed, prior labs reviewed, and assessment/plan discussed.  Return in about 4 months (around 09/06/2021) for Please cancel appt in 07/2021, needs AWV.  Aleenah Homen G. [place], MD  [facility]. [place] office.

## 2021-05-09 ENCOUNTER — Ambulatory Visit (INDEPENDENT_AMBULATORY_CARE_PROVIDER_SITE_OTHER): Payer: 59 | Admitting: Family Medicine

## 2021-05-09 ENCOUNTER — Encounter: Payer: Self-pay | Admitting: Family Medicine

## 2021-05-09 VITALS — BP 120/80 | HR 90 | Resp 16 | Ht 62.0 in | Wt 155.4 lb

## 2021-05-09 DIAGNOSIS — R7303 Prediabetes: Secondary | ICD-10-CM | POA: Diagnosis not present

## 2021-05-09 DIAGNOSIS — F17200 Nicotine dependence, unspecified, uncomplicated: Secondary | ICD-10-CM

## 2021-05-09 DIAGNOSIS — F17211 Nicotine dependence, cigarettes, in remission: Secondary | ICD-10-CM

## 2021-05-09 DIAGNOSIS — F319 Bipolar disorder, unspecified: Secondary | ICD-10-CM | POA: Diagnosis not present

## 2021-05-09 DIAGNOSIS — M792 Neuralgia and neuritis, unspecified: Secondary | ICD-10-CM

## 2021-05-09 DIAGNOSIS — I10 Essential (primary) hypertension: Secondary | ICD-10-CM

## 2021-05-09 DIAGNOSIS — M542 Cervicalgia: Secondary | ICD-10-CM

## 2021-05-09 DIAGNOSIS — Z1211 Encounter for screening for malignant neoplasm of colon: Secondary | ICD-10-CM

## 2021-05-09 LAB — BASIC METABOLIC PANEL
BUN: 12 mg/dL (ref 6–23)
CO2: 33 mEq/L — ABNORMAL HIGH (ref 19–32)
Calcium: 9.9 mg/dL (ref 8.4–10.5)
Chloride: 102 mEq/L (ref 96–112)
Creatinine, Ser: 0.63 mg/dL (ref 0.40–1.20)
GFR: 102.38 mL/min (ref >60.00)
Glucose, Bld: 69 mg/dL — ABNORMAL LOW (ref 70–99)
Potassium: 3.7 mEq/L (ref 3.5–5.1)
Sodium: 140 mEq/L (ref 135–145)

## 2021-05-09 LAB — HEMOGLOBIN A1C: Hgb A1c MFr Bld: 6.1 % (ref 4.6–6.5)

## 2021-05-09 MED ORDER — METHOCARBAMOL 500 MG PO TABS: 500.0000 mg | ORAL_TABLET | Freq: Two times a day (BID) | ORAL | 1 refills | Status: DC | PRN

## 2021-05-09 MED ORDER — GABAPENTIN 300 MG PO CAPS: 300.0000 mg | ORAL_CAPSULE | Freq: Two times a day (BID) | ORAL | 1 refills | Status: DC

## 2021-05-09 MED ORDER — NICOTINE 14 MG/24HR TD PT24: 14.0000 mg | MEDICATED_PATCH | Freq: Every day | TRANSDERMAL | 1 refills | Status: DC

## 2021-05-09 MED ORDER — DULOXETINE HCL 30 MG PO CPEP: 30.0000 mg | ORAL_CAPSULE | Freq: Every day | ORAL | 1 refills | Status: DC

## 2021-05-09 NOTE — Assessment & Plan Note (Addendum)
Chronic. Methocarbamol helped some, so refill sent. Side effects discussed. Duloxetine may help, some side effects discussed. Following with pain management.

## 2021-05-09 NOTE — Assessment & Plan Note (Addendum)
Continue Seroquel 100 mg 1/2 tab daily to continue. Duloxetine 30 mg started today. Instructed about warning signs. List of psychiatrist in the area with contact information given today.

## 2021-05-09 NOTE — Assessment & Plan Note (Addendum)
BP adequately controlled. Continue Amlodipine-Benazepril 5-20 mg daily and HCTZ 25 mg daily. Low salt/DASH diet recommended. Monitor BP at home.

## 2021-05-09 NOTE — Assessment & Plan Note (Addendum)
We reviewed healthy life style habits for diabetes prevention. Further recommendations according to HgA1C result.

## 2021-05-09 NOTE — Assessment & Plan Note (Addendum)
Encouraged smoking cessation.  We discussed adverse effects of tobacco use. Nicotine patches 14 mg/24 hours refill sent.

## 2021-05-09 NOTE — Assessment & Plan Note (Signed)
Continue gabapentin 300 mg bid. Following with pain pin management. Lumbar MRI result pending, per pt report.

## 2021-05-09 NOTE — Patient Instructions (Addendum)
A few things to remember from today's visit:  Hypertension, essential, benign - Plan: Basic metabolic panel  Bipolar 1 disorder, depressed ([place]), Chronic  Prediabetes - Plan: Basic metabolic panel, Hemoglobin A1c  Neuropathic pain of lower extremity, unspecified laterality - Plan: gabapentin (NEURONTIN) 300 MG capsule  Cervicalgia - Plan: DULoxetine (CYMBALTA) 30 MG capsule, methocarbamol (ROBAXIN) 500 MG tablet, gabapentin (NEURONTIN) 300 MG capsule  Colon cancer screening - Plan: Ambulatory referral to Gastroenterology  If you need refills please call your pharmacy. Do not use My Chart to request refills or for acute issues that need immediate attention.   Continue monitoring blood pressure at home, goal is under 140/90 ideally under 130/80. Be sure you received Methocarbamol at your pharmacy. Try to get 10,000 steps daily. Continue following with pain management and arrange appt with psychiatrist.  Cymbalta started today, let me know in 6 weeks if you feel like it is helping and if we can increase dose to 60 mg.  Please be sure medication list is accurate. If a new problem present, please set up appointment sooner than planned today.  DASH Eating Plan DASH stands for Dietary Approaches to Stop Hypertension. The DASH eating plan is a healthy eating plan that has been shown to: Reduce high blood pressure (hypertension). Reduce your risk for type 2 diabetes, heart disease, and stroke. Help with weight loss. What are tips for following this plan? Reading food labels Check food labels for the amount of salt (sodium) per serving. Choose foods with less than 5 percent of the Daily Value of sodium. Generally, foods with less than 300 milligrams (mg) of sodium per serving fit into this eating plan. To find whole grains, look for the word "whole" as the first word in the ingredient list. Shopping Buy products labeled as "low-sodium" or "no salt added." Buy fresh foods. Avoid canned  foods and pre-made or frozen meals. Cooking Avoid adding salt when cooking. Use salt-free seasonings or herbs instead of table salt or sea salt. Check with your health care provider or pharmacist before using salt substitutes. Do not fry foods. Cook foods using healthy methods such as baking, boiling, grilling, roasting, and broiling instead. Cook with heart-healthy oils, such as olive, canola, avocado, soybean, or sunflower oil. Meal planning  Eat a balanced diet that includes: 4 or more servings of fruits and 4 or more servings of vegetables each day. Try to fill one-half of your plate with fruits and vegetables. 6-8 servings of whole grains each day. Less than 6 oz (170 g) of lean meat, poultry, or fish each day. A 3-oz (85-g) serving of meat is about the same size as a deck of cards. One egg equals 1 oz (28 g). 2-3 servings of low-fat dairy each day. One serving is 1 cup (237 mL). 1 serving of nuts, seeds, or beans 5 times each week. 2-3 servings of heart-healthy fats. Healthy fats called omega-3 fatty acids are found in foods such as walnuts, flaxseeds, fortified milks, and eggs. These fats are also found in cold-water fish, such as sardines, salmon, and mackerel. Limit how much you eat of: Canned or prepackaged foods. Food that is high in trans fat, such as some fried foods. Food that is high in saturated fat, such as fatty meat. Desserts and other sweets, sugary drinks, and other foods with added sugar. Full-fat dairy products. Do not salt foods before eating. Do not eat more than 4 egg yolks a week. Try to eat at least 2 vegetarian meals a week.  Eat more home-cooked food and less restaurant, buffet, and fast food. Lifestyle When eating at a restaurant, ask that your food be prepared with less salt or no salt, if possible. If you drink alcohol: Limit how much you use to: 0-1 drink a day for women who are not pregnant. 0-2 drinks a day for men. Be aware of how much alcohol is in  your drink. In the [place] one drink equals one 12 oz bottle of beer (355 mL), one 5 oz glass of wine (148 mL), or one 1 oz glass of hard liquor (44 mL). General information Avoid eating more than 2,300 mg of salt a day. If you have hypertension, you may need to reduce your sodium intake to 1,500 mg a day. Work with your health care provider to maintain a healthy body weight or to lose weight. Ask what an ideal weight is for you. Get at least 30 minutes of exercise that causes your heart to beat faster (aerobic exercise) most days of the week. Activities may include walking, swimming, or biking. Work with your health care provider or dietitian to adjust your eating plan to your individual calorie needs. What foods should I eat? Fruits All fresh, dried, or frozen fruit. Canned fruit in natural juice (without added sugar). Vegetables Fresh or frozen vegetables (raw, steamed, roasted, or grilled). Low-sodium or reduced-sodium tomato and vegetable juice. Low-sodium or reduced-sodium tomato sauce and tomato paste. Low-sodium or reduced-sodium canned vegetables. Grains Whole-grain or whole-wheat bread. Whole-grain or whole-wheat pasta. Brown rice. [place]. Bulgur. Whole-grain and low-sodium cereals. Pita bread. Low-fat, low-sodium crackers. Whole-wheat flour tortillas. Meats and other proteins Skinless chicken or [place]. Ground chicken or [place]. Pork with fat trimmed off. Fish and seafood. Egg whites. Dried beans, peas, or lentils. Unsalted nuts, nut butters, and seeds. Unsalted canned beans. Lean cuts of beef with fat trimmed off. Low-sodium, lean precooked or cured meat, such as sausages or meat loaves. Dairy Low-fat (1%) or fat-free (skim) milk. Reduced-fat, low-fat, or fat-free cheeses. Nonfat, low-sodium ricotta or cottage cheese. Low-fat or nonfat yogurt. Low-fat, low-sodium cheese. Fats and oils Soft margarine without trans fats. Vegetable oil. Reduced-fat, low-fat, or light mayonnaise and  salad dressings (reduced-sodium). Canola, safflower, olive, avocado, soybean, and sunflower oils. Avocado. Seasonings and condiments Herbs. Spices. Seasoning mixes without salt. Other foods Unsalted popcorn and pretzels. Fat-free sweets. The items listed above may not be a complete list of foods and beverages you can eat. Contact a dietitian for more information. What foods should I avoid? Fruits Canned fruit in a light or heavy syrup. Fried fruit. Fruit in cream or butter sauce. Vegetables Creamed or fried vegetables. Vegetables in a cheese sauce. Regular canned vegetables (not low-sodium or reduced-sodium). Regular canned tomato sauce and paste (not low-sodium or reduced-sodium). Regular tomato and vegetable juice (not low-sodium or reduced-sodium). Angie Fava. Olives. Grains Baked goods made with fat, such as croissants, muffins, or some breads. Dry pasta or rice meal packs. Meats and other proteins Fatty cuts of meat. Ribs. Fried meat. Berniece Salines. Bologna, salami, and other precooked or cured meats, such as sausages or meat loaves. Fat from the back of a pig (fatback). Bratwurst. Salted nuts and seeds. Canned beans with added salt. Canned or smoked fish. Whole eggs or egg yolks. Chicken or [place] with skin. Dairy Whole or 2% milk, cream, and half-and-half. Whole or full-fat cream cheese. Whole-fat or sweetened yogurt. Full-fat cheese. Nondairy creamers. Whipped toppings. Processed cheese and cheese spreads. Fats and oils Butter. Stick margarine. Lard. Shortening. Ghee. Berniece Salines  fat. Tropical oils, such as coconut, palm kernel, or palm oil. Seasonings and condiments Onion salt, garlic salt, seasoned salt, table salt, and sea salt. Worcestershire sauce. Tartar sauce. Barbecue sauce. Teriyaki sauce. Soy sauce, including reduced-sodium. Steak sauce. Canned and packaged gravies. Fish sauce. Oyster sauce. Cocktail sauce. Store-bought horseradish. Ketchup. Mustard. Meat flavorings and tenderizers. Bouillon  cubes. Hot sauces. Pre-made or packaged marinades. Pre-made or packaged taco seasonings. Relishes. Regular salad dressings. Other foods Salted popcorn and pretzels. The items listed above may not be a complete list of foods and beverages you should avoid. Contact a dietitian for more information. Where to find more information National Heart, Lung, and Blood Institute: https://wilson-eaton.com/ American Heart Association: www.heart.org Academy of Nutrition and Dietetics: www.eatright.[place]: www.kidney.org Summary The DASH eating plan is a healthy eating plan that has been shown to reduce high blood pressure (hypertension). It may also reduce your risk for type 2 diabetes, heart disease, and stroke. When on the DASH eating plan, aim to eat more fresh fruits and vegetables, whole grains, lean proteins, low-fat dairy, and heart-healthy fats. With the DASH eating plan, you should limit salt (sodium) intake to 2,300 mg a day. If you have hypertension, you may need to reduce your sodium intake to 1,500 mg a day. Work with your health care provider or dietitian to adjust your eating plan to your individual calorie needs. This information is not intended to replace advice given to you by your health care provider. Make sure you discuss any questions you have with your health care provider. Document Revised: 02/20/2019 Document Reviewed: 02/20/2019 Elsevier Patient Education  2022 [employer].

## 2021-05-11 ENCOUNTER — Encounter: Payer: Self-pay | Admitting: Family Medicine

## 2021-05-19 ENCOUNTER — Ambulatory Visit (INDEPENDENT_AMBULATORY_CARE_PROVIDER_SITE_OTHER): Payer: 59

## 2021-05-19 ENCOUNTER — Other Ambulatory Visit: Payer: Self-pay | Admitting: Family Medicine

## 2021-05-19 VITALS — BP 128/64 | HR 85 | Temp 98.1°F | Ht 62.0 in | Wt 155.3 lb

## 2021-05-19 DIAGNOSIS — Z0000 Encounter for general adult medical examination without abnormal findings: Secondary | ICD-10-CM

## 2021-05-19 NOTE — Progress Notes (Signed)
Subjective:   Patricia [name] is a 51 y.o. female who presents for Medicare Annual (Subsequent) preventive examination.  Review of Systems     Cardiac Risk Factors include: advanced age (>68men, >65 women);hypertension;smoking/ tobacco exposure     Objective:    Today's Vitals   05/19/21 1525 05/19/21 1533  BP: 128/64   Pulse: 85   Temp: 98.1 F (36.7 C)   TempSrc: Oral   SpO2: 99%   Weight: 155 lb 4.8 oz (70.4 kg)   Height: 5\' 2"  (1.575 m)   PainSc:  6    Body mass index is 28.4 kg/m.  Advanced Directives 05/19/2021 04/13/2020 05/23/2018 05/18/2017 11/18/2016 04/04/2014  Does Patient Have a Medical Advance Directive? No No No No No No  Would patient like information on creating a medical advance directive? No - Patient declined No - Patient declined Yes (MAU/Ambulatory/Procedural Areas - Information given) No - Patient declined - No - patient declined information    Current Medications (verified) Outpatient Encounter Medications as of 05/19/2021  Medication Sig   amLODipine-benazepril (LOTREL) 5-20 MG capsule Take 1 capsule by mouth daily.   DULoxetine (CYMBALTA) 30 MG capsule Take 1 capsule (30 mg total) by mouth daily.   gabapentin (NEURONTIN) 300 MG capsule Take 1 capsule (300 mg total) by mouth 2 (two) times daily.   hydrochlorothiazide (HYDRODIURIL) 25 MG tablet TAKE 1 TABLET(25 MG) BY MOUTH DAILY   methocarbamol (ROBAXIN) 500 MG tablet Take 1 tablet (500 mg total) by mouth 2 (two) times daily as needed for muscle spasms.   Multiple Vitamins-Minerals (MULTIVITAMIN WITH MINERALS) tablet Take 1 tablet by mouth daily.   nicotine (NICODERM CQ - DOSED IN MG/24 HOURS) 14 mg/24hr patch Place 1 patch (14 mg total) onto the skin daily.   omeprazole (PRILOSEC) 20 MG capsule TAKE 1 CAPSULE(20 MG) BY MOUTH DAILY BEFORE AND BREAKFAST   ondansetron (ZOFRAN) 4 MG tablet Take 1 tablet (4 mg total) by mouth every 6 (six) hours.   QUEtiapine (SEROQUEL) 100 MG tablet Take 0.5 tablets (50  mg total) by mouth at bedtime.   No facility-administered encounter medications on file as of 05/19/2021.    Allergies (verified) Tramadol   History: Past Medical History:  Diagnosis Date   Abnormal perimenopausal bleeding    Cervical dysplasia    Depression    Head trauma    Pregnancy induced hypertension    Rheumatoid arthritis ([place])    Seizure disorder ([place])    Shingles    STD (sexually transmitted disease)    Gonorrhea   Past Surgical History:  Procedure Laterality Date   BICEPS TENDON REPAIR     CESAREAN SECTION     DILATATION & CURETTAGE/HYSTEROSCOPY WITH MYOSURE N/A 05/23/2018   Procedure: DILATATION & CURETTAGE/HYSTEROSCOPY;  Surgeon: Anastasio Auerbach, MD;  Location: [place];  Service: Gynecology;  Laterality: N/A;   SHOULDER SURGERY     WISDOM TOOTH EXTRACTION     Family History  Problem Relation Age of Onset   Healthy Father    Cancer Maternal Grandfather        Lung   Healthy Mother    Social History   Socioeconomic History   Marital status: Single    Spouse name: Not on file   Number of children: Not on file   Years of education: Not on file   Highest education level: Not on file  Occupational History   Not on file  Tobacco Use   Smoking status: Former  Packs/day: 0.10    Years: 20.00    Pack years: 2.00    Types: Cigarettes    Start date: 08/31/2016   Smokeless tobacco: Never   Tobacco comments:    quit 04-19-2018  Vaping Use   Vaping Use: Never used  Substance and Sexual Activity   Alcohol use: Yes    Comment: occasionaly   Drug use: Yes    Types: Marijuana    Comment: last use 05-19-2018   Sexual activity: Yes    Comment: 1st intercourse 18 yrs-5 partners  Other Topics Concern   Not on file  Social History Narrative   Lives with godson, daughter and grandson      Lives in a two story home.      Right handed      Highest level of education- 12 grade      Disabled      Social Determinants of Health    Financial Resource Strain: Medium Risk   Difficulty of Paying Living Expenses: Somewhat hard  Food Insecurity: Food Insecurity Present   Worried About Charity fundraiser in the Last Year: Sometimes true   Ran Out of Food in the Last Year: Sometimes true  Transportation Needs: No Transportation Needs   Lack of Transportation (Medical): No   Lack of Transportation (Non-Medical): No  Physical Activity: Insufficiently Active   Days of Exercise per Week: 3 days   Minutes of Exercise per Session: 10 min  Stress: Stress Concern Present   Feeling of Stress : To some extent  Social Connections: Moderately Integrated   Frequency of Communication with Friends and Family: Three times a week   Frequency of Social Gatherings with Friends and Family: Three times a week   Attends Religious Services: More than 4 times per year   Active Member of Clubs or Organizations: Yes   Attends Archivist Meetings: More than 4 times per year   Marital Status: Never married     Clinical Intake:  Pre-visit preparation completed: Yes Diabetic? No  Activities of Daily Living In your present state of health, do you have any difficulty performing the following activities: 05/19/2021  Hearing? N  Vision? N  Difficulty concentrating or making decisions? N  Walking or climbing stairs? N  Dressing or bathing? N  Doing errands, shopping? N  Preparing Food and eating ? N  Using the Toilet? N  In the past six months, have you accidently leaked urine? N  Do you have problems with loss of bowel control? N  Managing your Medications? N  Managing your Finances? N  Housekeeping or managing your Housekeeping? N  Some recent data might be hidden    Patient Care Team: [place], Betty G, MD as PCP - General (Family Medicine)  Indicate any recent Medical Services you may have received from other than [facility] providers in the past year (date may be approximate).     Assessment:   This is a routine wellness  examination for [place].  Hearing/Vision screen Hearing Screening - Comments:: No difficulty hearing Vision Screening - Comments:: Wears glasses. Followed by Dr Schuyler Amor  Dietary issues and exercise activities discussed: Exercise limited by: None identified   Goals Addressed               This Visit's Progress     Patient Stated (pt-stated)        I will continue to go to the gym twice per week  for at least 2 hrs  Depression Screen PHQ 2/9 Scores 05/19/2021 05/09/2021 04/13/2020 06/02/2018 09/30/2017 08/09/2017  PHQ - 2 Score 0 3 0 0 2 0  PHQ- 9 Score 1 14 0 - - -    Fall Risk Fall Risk  05/19/2021 04/13/2020 02/24/2019 01/28/2019 09/22/2018  Falls in the past year? 0 0 0 0 0  Number falls in past yr: 0 0 - - 0  Injury with Fall? 0 0 - - 0  Risk for fall due to : - Impaired balance/gait - - -  Follow up - Falls evaluation completed;Falls prevention discussed - - Falls evaluation completed    FALL RISK PREVENTION PERTAINING TO THE HOME:  Any stairs in or around the home? Yes  If so, are there any without handrails? No  Home free of loose throw rugs in walkways, pet beds, electrical cords, etc? No  Adequate lighting in your home to reduce risk of falls? Yes   ASSISTIVE DEVICES UTILIZED TO PREVENT FALLS:  Life alert? No  Use of a cane, walker or w/c? No  Grab bars in the bathroom? No  Shower chair or bench in shower? No  Elevated toilet seat or a handicapped toilet? No   TIMED UP AND GO:  Was the test performed? Yes .  Length of time to ambulate 10 feet: 5 sec.   Gait slow and steady without use of assistive device  Cognitive Function:    Immunizations Immunization History  Administered Date(s) Administered   Influenza,inj,Quad PF,6+ Mos 03/10/2018, 12/13/2020   Influenza-Unspecified 01/17/2017   PFIZER(Purple Top)SARS-COV-2 Vaccination 07/04/2020    TDAP status: Due, Education has been provided regarding the importance of this vaccine. Advised may receive  this vaccine at local pharmacy or Health Dept. Aware to provide a copy of the vaccination record if obtained from local pharmacy or Health Dept. Verbalized acceptance and understanding.  Flu Vaccine status: Up to date  Pneumococcal vaccine status: Due, Education has been provided regarding the importance of this vaccine. Advised may receive this vaccine at local pharmacy or Health Dept. Aware to provide a copy of the vaccination record if obtained from local pharmacy or Health Dept. Verbalized acceptance and understanding.  Covid Vaccine Up to date  Qualifies for Shingles Vaccine? Yes   Zostavax completed No   Shingrix Completed?: No.    Education has been provided regarding the importance of this vaccine. Patient has been advised to call insurance company to determine out of pocket expense if they have not yet received this vaccine. Advised may also receive vaccine at local pharmacy or Health Dept. Verbalized acceptance and understanding.  Screening Tests Health Maintenance  Topic Date Due   TETANUS/TDAP  Never done   COLONOSCOPY (Pts 45-44yrs Insurance coverage will need to be confirmed)  Never done   COVID-19 Vaccine (2 - Pfizer series) 07/25/2020   PAP SMEAR-Modifier  03/18/2021   Zoster Vaccines- Shingrix (1 of 2) 08/16/2021 (Originally 06/22/2019)   HIV Screening  07/27/2023 (Originally 06/21/1985)   MAMMOGRAM  09/07/2022   INFLUENZA VACCINE  Completed   Hepatitis C Screening  Completed   HPV VACCINES  Aged Out    Health Maintenance  Health Maintenance Due  Topic Date Due   TETANUS/TDAP  Never done   COLONOSCOPY (Pts 45-53yrs Insurance coverage will need to be confirmed)  Never done   COVID-19 Vaccine (2 - [place] series) 07/25/2020   PAP SMEAR-Modifier  03/18/2021    Colorectal cancer screening: Referral to GI placed 05/09/21. Pt aware the office will call re: appt.  Mammogram status: Completed 09/06/20. Repeat every year   Lung Cancer Screening: (Low Dose CT Chest  recommended if Age 55-80 years, 30 pack-year currently smoking OR have quit w/in 15years.) does qualify.   Additional Screening:  Hepatitis C Screening: does qualify; Completed   Vision Screening: Recommended annual ophthalmology exams for early detection of glaucoma and other disorders of the eye. Is the patient up to date with their annual eye exam?  Yes  Who is the provider or what is the name of the office in which the patient attends annual eye exams? Dr Schuyler Amor If pt is not established with a provider, would they like to be referred to a provider to establish care? No .   Dental Screening: Recommended annual dental exams for proper oral hygiene  Community Resource Referral / Chronic Care Management:  CRR required this visit?  No   CCM required this visit?  No      Plan:     I have personally reviewed and noted the following in the patients chart:   Medical and social history Use of alcohol, tobacco or illicit drugs  Current medications and supplements including opioid prescriptions.  Functional ability and status Nutritional status Physical activity Advanced directives List of other physicians Hospitalizations, surgeries, and ER visits in previous 12 months Vitals Screenings to include cognitive, depression, and falls Referrals and appointments  In addition, I have reviewed and discussed with patient certain preventive protocols, quality metrics, and best practice recommendations. A written personalized care plan for preventive services as well as general preventive health recommendations were provided to patient.     Criselda Peaches, LPN   [id number]   Nurse Notes: None

## 2021-05-19 NOTE — Patient Instructions (Addendum)
Patricia [name] , Thank you for taking time to come for your Medicare Wellness Visit. I appreciate your ongoing commitment to your health goals. Please review the following plan we discussed and let me know if I can assist you in the future.   These are the goals we discussed:  Goals       Patient Stated (pt-stated)      I will continue to go to the gym twice per week  for at least 2 hrs         This is a list of the screening recommended for you and due dates:  Health Maintenance  Topic Date Due   Tetanus Vaccine  Never done   Colon Cancer Screening  Never done   COVID-19 Vaccine (2 - Pfizer series) 07/25/2020   Pap Smear  03/18/2021   Zoster (Shingles) Vaccine (1 of 2) 08/16/2021*   HIV Screening  07/27/2023*   Mammogram  09/07/2022   Flu Shot  Completed   Hepatitis C Screening: USPSTF Recommendation to screen - Ages 18-79 yo.  Completed   HPV Vaccine  Aged Out  *Topic was postponed. The date shown is not the original due date.    Advanced directives: No Patient deferred  Conditions/risks identified: None  Next appointment: Follow up in one year for your annual wellness visit.   Preventive Care 40-64 Years, Female Preventive care refers to lifestyle choices and visits with your health care provider that can promote health and wellness. What does preventive care include? A yearly physical exam. This is also called an annual well check. Dental exams once or twice a year. Routine eye exams. Ask your health care provider how often you should have your eyes checked. Personal lifestyle choices, including: Daily care of your teeth and gums. Regular physical activity. Eating a healthy diet. Avoiding tobacco and drug use. Limiting alcohol use. Practicing safe sex. Taking low-dose aspirin daily starting at age 52. Taking vitamin and mineral supplements as recommended by your health care provider. What happens during an annual well check? The services and screenings done by  your health care provider during your annual well check will depend on your age, overall health, lifestyle risk factors, and family history of disease. Counseling  Your health care provider may ask you questions about your: Alcohol use. Tobacco use. Drug use. Emotional well-being. Home and relationship well-being. Sexual activity. Eating habits. Work and work Statistician. Method of birth control. Menstrual cycle. Pregnancy history. Screening  You may have the following tests or measurements: Height, weight, and BMI. Blood pressure. Lipid and cholesterol levels. These may be checked every 5 years, or more frequently if you are over 16 years old. Skin check. Lung cancer screening. You may have this screening every year starting at age 74 if you have a 30-pack-year history of smoking and currently smoke or have quit within the past 15 years. Fecal occult blood test (FOBT) of the stool. You may have this test every year starting at age 63. Flexible sigmoidoscopy or colonoscopy. You may have a sigmoidoscopy every 5 years or a colonoscopy every 10 years starting at age 49. Hepatitis C blood test. Hepatitis B blood test. Sexually transmitted disease (STD) testing. Diabetes screening. This is done by checking your blood sugar (glucose) after you have not eaten for a while (fasting). You may have this done every 1-3 years. Mammogram. This may be done every 1-2 years. Talk to your health care provider about when you should start having regular mammograms. This may depend  on whether you have a family history of breast cancer. BRCA-related cancer screening. This may be done if you have a family history of breast, ovarian, tubal, or peritoneal cancers. Pelvic exam and Pap test. This may be done every 3 years starting at age 71. Starting at age 61, this may be done every 5 years if you have a Pap test in combination with an HPV test. Bone density scan. This is done to screen for osteoporosis. You may  have this scan if you are at high risk for osteoporosis. Discuss your test results, treatment options, and if necessary, the need for more tests with your health care provider. Vaccines  Your health care provider may recommend certain vaccines, such as: Influenza vaccine. This is recommended every year. Tetanus, diphtheria, and acellular pertussis (Tdap, Td) vaccine. You may need a Td booster every 10 years. Zoster vaccine. You may need this after age 53. Pneumococcal 13-valent conjugate (PCV13) vaccine. You may need this if you have certain conditions and were not previously vaccinated. Pneumococcal polysaccharide (PPSV23) vaccine. You may need one or two doses if you smoke cigarettes or if you have certain conditions. Talk to your health care provider about which screenings and vaccines you need and how often you need them. This information is not intended to replace advice given to you by your health care provider. Make sure you discuss any questions you have with your health care provider. Document Released: 04/15/2015 Document Revised: 12/07/2015 Document Reviewed: 01/18/2015 Elsevier Interactive Patient Education  2017 [employer] Prevention in the Home Falls can cause injuries. They can happen to people of all ages. There are many things you can do to make your home safe and to help prevent falls. What can I do on the outside of my home? Regularly fix the edges of walkways and driveways and fix any cracks. Remove anything that might make you trip as you walk through a door, such as a raised step or threshold. Trim any bushes or trees on the path to your home. Use bright outdoor lighting. Clear any walking paths of anything that might make someone trip, such as rocks or tools. Regularly check to see if handrails are loose or broken. Make sure that both sides of any steps have handrails. Any raised decks and porches should have guardrails on the edges. Have any leaves, snow,  or ice cleared regularly. Use sand or salt on walking paths during winter. Clean up any spills in your garage right away. This includes oil or grease spills. What can I do in the bathroom? Use night lights. Install grab bars by the toilet and in the tub and shower. Do not use towel bars as grab bars. Use non-skid mats or decals in the tub or shower. If you need to sit down in the shower, use a plastic, non-slip stool. Keep the floor dry. Clean up any water that spills on the floor as soon as it happens. Remove soap buildup in the tub or shower regularly. Attach bath mats securely with double-sided non-slip rug tape. Do not have throw rugs and other things on the floor that can make you trip. What can I do in the bedroom? Use night lights. Make sure that you have a light by your bed that is easy to reach. Do not use any sheets or blankets that are too big for your bed. They should not hang down onto the floor. Have a firm chair that has side arms. You can use  this for support while you get dressed. Do not have throw rugs and other things on the floor that can make you trip. What can I do in the kitchen? Clean up any spills right away. Avoid walking on wet floors. Keep items that you use a lot in easy-to-reach places. If you need to reach something above you, use a strong step stool that has a grab bar. Keep electrical cords out of the way. Do not use floor polish or wax that makes floors slippery. If you must use wax, use non-skid floor wax. Do not have throw rugs and other things on the floor that can make you trip. What can I do with my stairs? Do not leave any items on the stairs. Make sure that there are handrails on both sides of the stairs and use them. Fix handrails that are broken or loose. Make sure that handrails are as long as the stairways. Check any carpeting to make sure that it is firmly attached to the stairs. Fix any carpet that is loose or worn. Avoid having throw rugs  at the top or bottom of the stairs. If you do have throw rugs, attach them to the floor with carpet tape. Make sure that you have a light switch at the top of the stairs and the bottom of the stairs. If you do not have them, ask someone to add them for you. What else can I do to help prevent falls? Wear shoes that: Do not have high heels. Have rubber bottoms. Are comfortable and fit you well. Are closed at the toe. Do not wear sandals. If you use a stepladder: Make sure that it is fully opened. Do not climb a closed stepladder. Make sure that both sides of the stepladder are locked into place. Ask someone to hold it for you, if possible. Clearly mark and make sure that you can see: Any grab bars or handrails. First and last steps. Where the edge of each step is. Use tools that help you move around (mobility aids) if they are needed. These include: Canes. Walkers. Scooters. Crutches. Turn on the lights when you go into a dark area. Replace any light bulbs as soon as they burn out. Set up your furniture so you have a clear path. Avoid moving your furniture around. If any of your floors are uneven, fix them. If there are any pets around you, be aware of where they are. Review your medicines with your doctor. Some medicines can make you feel dizzy. This can increase your chance of falling. Ask your doctor what other things that you can do to help prevent falls. This information is not intended to replace advice given to you by your health care provider. Make sure you discuss any questions you have with your health care provider. Document Released: 01/13/2009 Document Revised: 08/25/2015 Document Reviewed: 04/23/2014 Elsevier Interactive Patient Education  2017 [employer].

## 2021-05-31 ENCOUNTER — Encounter: Payer: Self-pay | Admitting: Gastroenterology

## 2021-06-13 ENCOUNTER — Ambulatory Visit (AMBULATORY_SURGERY_CENTER): Payer: Medicare Other

## 2021-06-13 ENCOUNTER — Other Ambulatory Visit: Payer: Self-pay

## 2021-06-13 VITALS — Ht 62.0 in | Wt 155.0 lb

## 2021-06-13 DIAGNOSIS — Z1211 Encounter for screening for malignant neoplasm of colon: Secondary | ICD-10-CM

## 2021-06-13 MED ORDER — NA SULFATE-K SULFATE-MG SULF 17.5-3.13-1.6 GM/177ML PO SOLN: 1.0000 | Freq: Once | ORAL | 0 refills | Status: AC

## 2021-06-13 NOTE — Progress Notes (Signed)
? ?  Patient's pre-visit was done today over the phone with the patient  ? ?Name,DOB and address verified.  ?  ?Patient denies any allergies to Eggs and Soy.  ? ?Patient denies any problems with anesthesia/sedation. ? ?Patient denies taking diet pills or blood thinners.  ? ?Denies atrial flutter or atrial fib ? ?Denies chronic constipation ? ?No home Oxygen.  ? ?Packet of Prep instructions sent by My Chart or mail to patient including a copy of a consent form if by mail-pt is aware.  ? ?Patient understands to call us back with any questions or concerns.  ? ?Patient is aware of our care-partner policy and MVHQI-69 safety protocol.  ? ?Last seizure 1995. ? ? ?  ? ?  ?

## 2021-07-04 ENCOUNTER — Encounter: Payer: Self-pay | Admitting: Gastroenterology

## 2021-07-04 ENCOUNTER — Ambulatory Visit (AMBULATORY_SURGERY_CENTER): Payer: Medicare Other | Admitting: Gastroenterology

## 2021-07-04 VITALS — BP 133/88 | HR 79 | Temp 98.2°F | Resp 11 | Ht 62.0 in | Wt 155.0 lb

## 2021-07-04 DIAGNOSIS — Z1211 Encounter for screening for malignant neoplasm of colon: Secondary | ICD-10-CM | POA: Diagnosis not present

## 2021-07-04 DIAGNOSIS — F32A Depression, unspecified: Secondary | ICD-10-CM | POA: Diagnosis not present

## 2021-07-04 DIAGNOSIS — D124 Benign neoplasm of descending colon: Secondary | ICD-10-CM | POA: Diagnosis not present

## 2021-07-04 DIAGNOSIS — D122 Benign neoplasm of ascending colon: Secondary | ICD-10-CM | POA: Diagnosis not present

## 2021-07-04 DIAGNOSIS — K635 Polyp of colon: Secondary | ICD-10-CM | POA: Diagnosis not present

## 2021-07-04 MED ORDER — SODIUM CHLORIDE 0.9 % IV SOLN: 500.0000 mL | Freq: Once | INTRAVENOUS | Status: DC

## 2021-07-04 NOTE — Progress Notes (Signed)
PT taken to PACU. Monitors in place. VSS. Report given to RN. 

## 2021-07-04 NOTE — Progress Notes (Signed)
Called to room to assist during endoscopic procedure.  Patient ID and intended procedure confirmed with present staff. Received instructions for my participation in the procedure from the performing physician.  

## 2021-07-04 NOTE — Progress Notes (Signed)
VS completed by CW.   Pt's states no medical or surgical changes since previsit or office visit.  

## 2021-07-04 NOTE — Patient Instructions (Signed)
Discharge instructions given. Handout on polyps. Resume previous medications. YOU HAD AN ENDOSCOPIC PROCEDURE TODAY AT THE [facility]:   Refer to the procedure report that was given to you for any specific questions about what was found during the examination.  If the procedure report does not answer your questions, please call your gastroenterologist to clarify.  If you requested that your care partner not be given the details of your procedure findings, then the procedure report has been included in a sealed envelope for you to review at your convenience later.  YOU SHOULD EXPECT: Some feelings of bloating in the abdomen. Passage of more gas than usual.  Walking can help get rid of the air that was put into your GI tract during the procedure and reduce the bloating. If you had a lower endoscopy (such as a colonoscopy or flexible sigmoidoscopy) you may notice spotting of blood in your stool or on the toilet paper. If you underwent a bowel prep for your procedure, you may not have a normal bowel movement for a few days.  Please Note:  You might notice some irritation and congestion in your nose or some drainage.  This is from the oxygen used during your procedure.  There is no need for concern and it should clear up in a day or so.  SYMPTOMS TO REPORT IMMEDIATELY:  Following lower endoscopy (colonoscopy or flexible sigmoidoscopy):  Excessive amounts of blood in the stool  Significant tenderness or worsening of abdominal pains  Swelling of the abdomen that is new, acute  Fever of 100F or higher   For urgent or emergent issues, a gastroenterologist can be reached at any hour by calling [phone]. Do not use MyChart messaging for urgent concerns.    DIET:  We do recommend a small meal at first, but then you may proceed to your regular diet.  Drink plenty of fluids but you should avoid alcoholic beverages for 24 hours.  ACTIVITY:  You should plan to take it easy for the rest  of today and you should NOT DRIVE or use heavy machinery until tomorrow (because of the sedation medicines used during the test).    FOLLOW UP: Our staff will call the number listed on your records 48-72 hours following your procedure to check on you and address any questions or concerns that you may have regarding the information given to you following your procedure. If we do not reach you, we will leave a message.  We will attempt to reach you two times.  During this call, we will ask if you have developed any symptoms of COVID 19. If you develop any symptoms (ie: fever, flu-like symptoms, shortness of breath, cough etc.) before then, please call [phone].  If you test positive for Covid 19 in the 2 weeks post procedure, please call and report this information to us.    If any biopsies were taken you will be contacted by phone or by letter within the next 1-3 weeks.  Please call us at [phone] if you have not heard about the biopsies in 3 weeks.    SIGNATURES/CONFIDENTIALITY: You and/or your care partner have signed paperwork which will be entered into your electronic medical record.  These signatures attest to the fact that that the information above on your After Visit Summary has been reviewed and is understood.  Full responsibility of the confidentiality of this discharge information lies with you and/or your care-partner.  

## 2021-07-04 NOTE — Op Note (Signed)
[place] ?Patient Name: Patricia [name] ?Procedure Date: 07/04/2021 11:06 AM ?MRN: [id number] ?Endoscopist: Thornton Park MD, MD ?Age: 51 ?Referring MD:  ?Date of Birth: 02-04-1971 ?Gender: Female ?Account #: [id number] ?Procedure:                Colonoscopy ?Indications:              Screening for colorectal malignant neoplasm, This  ?                          is the patient's first colonoscopy ?Medicines:                Monitored Anesthesia Care ?Procedure:                Pre-Anesthesia Assessment: ?                          - Prior to the procedure, a History and Physical  ?                          was performed, and patient medications and  ?                          allergies were reviewed. The patient's tolerance of  ?                          previous anesthesia was also reviewed. The risks  ?                          and benefits of the procedure and the sedation  ?                          options and risks were discussed with the patient.  ?                          All questions were answered, and informed consent  ?                          was obtained. Prior Anticoagulants: The patient has  ?                          taken no previous anticoagulant or antiplatelet  ?                          agents. ASA Grade Assessment: II - A patient with  ?                          mild systemic disease. After reviewing the risks  ?                          and benefits, the patient was deemed in  ?                          satisfactory condition to undergo the procedure. ?  After obtaining informed consent, the colonoscope  ?                          was passed under direct vision. Throughout the  ?                          procedure, the patient's blood pressure, pulse, and  ?                          oxygen saturations were monitored continuously. The  ?                          Olympus PCF-H190DL (#[id number]) Colonoscope was  ?                          introduced through the  anus and advanced to the 2  ?                          cm into the ileum. The colonoscopy was technically  ?                          difficult and complex due to restricted mobility of  ?                          the colon. Successful completion of the procedure  ?                          was aided by changing the patient's position,  ?                          withdrawing the scope and replacing with the  ?                          pediatric colonoscope and applying abdominal  ?                          pressure. The patient tolerated the procedure well.  ?                          The quality of the bowel preparation was good. The  ?                          terminal ileum, ileocecal valve, appendiceal  ?                          orifice, and rectum were photographed. ?Scope In: 11:15:22 AM ?Scope Out: 11:38:47 AM ?Scope Withdrawal Time: 0 hours 13 minutes 7 seconds  ?Total Procedure Duration: 0 hours 23 minutes 25 seconds  ?Findings:                 The perianal and digital rectal examinations were  ?                          normal. ?  Two sessile polyps were found in the descending  ?                          colon. The polyps were 3 to 4 mm in size. These  ?                          polyps were removed with a cold snare. Resection  ?                          and retrieval were complete. Estimated blood loss  ?                          was minimal. ?                          A 3 mm polyp was found in the ascending colon. The  ?                          polyp was sessile. The polyp was removed with a  ?                          cold snare. Resection and retrieval were complete.  ?                          Estimated blood loss was minimal. ?                          The exam was otherwise without abnormality on  ?                          direct and retroflexion views. ?Complications:            No immediate complications. ?Estimated Blood Loss:     Estimated blood loss was  minimal. ?Impression:               - Two 3 to 4 mm polyps in the descending colon,  ?                          removed with a cold snare. Resected and retrieved. ?                          - One 3 mm polyp in the ascending colon, removed  ?                          with a cold snare. Resected and retrieved. ?                          - The examination was otherwise normal on direct  ?                          and retroflexion views. ?Recommendation:           - Patient has a contact number available for  ?  emergencies. The signs and symptoms of potential  ?                          delayed complications were discussed with the  ?                          patient. Return to normal activities tomorrow.  ?                          Written discharge instructions were provided to the  ?                          patient. ?                          - Resume previous diet. ?                          - Continue present medications. ?                          - Await pathology results. ?                          - Repeat colonoscopy date to be determined after  ?                          pending pathology results are reviewed for  ?                          surveillance. ?                          - Emerging evidence supports eating a diet of  ?                          fruits, vegetables, grains, calcium, and yogurt  ?                          while reducing red meat and alcohol may reduce the  ?                          risk of colon cancer. ?                          - Thank you for allowing me to be involved in your  ?                          colon cancer prevention. ?Thornton Park MD, MD ?07/04/2021 11:42:19 AM ?This report has been signed electronically. ?

## 2021-07-04 NOTE — Progress Notes (Signed)
? ?Referring Provider: Martinique, Betty G, MD ?Primary Care Physician:  Martinique, Betty G, MD ? ?Indication for Procedure:  Colon cancer screening ? ? ?IMPRESSION:  ?Need for colon cancer screening ?Appropriate candidate for monitored anesthesia care ? ?PLAN: ?Colonoscopy in the [facility] today ? ? ?HPI: CORLISS COGGESHALL is a 51 y.o. female presents for screening colonoscopy. ? ?No prior colonoscopy or colon cancer screening. ? ?No baseline GI symptoms.  ? ?No known family history of colon cancer or polyps. No family history of uterine/endometrial cancer, pancreatic cancer or gastric/stomach cancer. ? ? ?Past Medical History:  ?Diagnosis Date  ? Abnormal perimenopausal bleeding   ? Anxiety   ? Cervical dysplasia   ? Depression   ? GERD (gastroesophageal reflux disease)   ? Head trauma   ? Pregnancy induced hypertension   ? Rheumatoid arthritis ([place])   ? Seizure disorder ([place])   ? Shingles   ? STD (sexually transmitted disease)   ? Gonorrhea  ? ? ?Past Surgical History:  ?Procedure Laterality Date  ? BICEPS TENDON REPAIR    ? CESAREAN SECTION    ? DILATATION & CURETTAGE/HYSTEROSCOPY WITH MYOSURE N/A 05/23/2018  ? Procedure: DILATATION & CURETTAGE/HYSTEROSCOPY;  Surgeon: Anastasio Auerbach, MD;  Location: [facility];  Service: Gynecology;  Laterality: N/A;  ? SHOULDER SURGERY    ? WISDOM TOOTH EXTRACTION    ? ? ?Current Outpatient Medications  ?Medication Sig Dispense Refill  ? amLODipine-benazepril (LOTREL) 5-20 MG capsule Take 1 capsule by mouth daily. 90 capsule 1  ? gabapentin (NEURONTIN) 300 MG capsule Take 1 capsule (300 mg total) by mouth 2 (two) times daily. 90 capsule 1  ? methocarbamol (ROBAXIN) 500 MG tablet Take 1 tablet (500 mg total) by mouth 2 (two) times daily as needed for muscle spasms. 60 tablet 1  ? Multiple Vitamins-Minerals (MULTIVITAMIN WITH MINERALS) tablet Take 1 tablet by mouth daily.    ? omeprazole (PRILOSEC) 20 MG capsule Take 1 capsule (20 mg total) by mouth daily. 90 capsule 2  ?  QUEtiapine (SEROQUEL) 100 MG tablet Take 0.5 tablets (50 mg total) by mouth at bedtime. 90 tablet 1  ? DULoxetine (CYMBALTA) 30 MG capsule Take 1 capsule (30 mg total) by mouth daily. 30 capsule 1  ? hydrochlorothiazide (HYDRODIURIL) 25 MG tablet TAKE 1 TABLET(25 MG) BY MOUTH DAILY 90 tablet 2  ? nicotine (NICODERM CQ - DOSED IN MG/24 HOURS) 14 mg/24hr patch Place 1 patch (14 mg total) onto the skin daily. 28 patch 1  ? ondansetron (ZOFRAN) 4 MG tablet Take 1 tablet (4 mg total) by mouth every 6 (six) hours. (Patient not taking: Reported on 06/13/2021) 12 tablet 0  ? ?Current Facility-Administered Medications  ?Medication Dose Route Frequency Provider Last Rate Last Admin  ? 0.9 %  sodium chloride infusion  500 mL Intravenous Once Thornton Park, MD      ? ? ?Allergies as of 07/04/2021 - Review Complete 07/04/2021  ?Allergen Reaction Noted  ? Tramadol  11/26/2019  ? ? ?Family History  ?Problem Relation Age of Onset  ? Healthy Mother   ? Healthy Father   ? Esophageal cancer Paternal Uncle   ? Cancer Maternal Grandfather   ?     Lung  ? Colon cancer Neg Hx   ? Colon polyps Neg Hx   ? Rectal cancer Neg Hx   ? Stomach cancer Neg Hx   ? ? ? ?Physical Exam: ?General:   Alert,  well-nourished, pleasant and cooperative in NAD ?Head:  Normocephalic and atraumatic. ?Eyes:  Sclera clear, no icterus.   Conjunctiva pink. ?Mouth:  No deformity or lesions.   ?Neck:  Supple; no masses or thyromegaly. ?Lungs:  Clear throughout to auscultation.   No wheezes. ?Heart:  Regular rate and rhythm; no murmurs. ?Abdomen:  Soft, non-tender, nondistended, normal bowel sounds, no rebound or guarding.  ?Msk:  Symmetrical. No boney deformities ?LAD: No inguinal or umbilical LAD ?Extremities:  No clubbing or edema. ?Neurologic:  Alert and  oriented x4;  grossly nonfocal ?Skin:  No obvious rash or bruise. ?Psych:  Alert and cooperative. Normal mood and affect. ? ? ? ? ?Studies/Results: ?No results found. ? ? ? ?Lexus Barletta L. Tarri Glenn, MD,  MPH ?07/04/2021, 11:00 AM ? ? ? ?  ?

## 2021-07-06 ENCOUNTER — Telehealth: Payer: Self-pay | Admitting: *Deleted

## 2021-07-06 ENCOUNTER — Telehealth: Payer: Self-pay

## 2021-07-06 NOTE — Telephone Encounter (Signed)
No answer for post procedure call back. Unable to leave voicemail. ?

## 2021-07-06 NOTE — Telephone Encounter (Signed)
Left message on follow up call. 

## 2021-07-08 ENCOUNTER — Encounter: Payer: Self-pay | Admitting: Gastroenterology

## 2021-07-13 ENCOUNTER — Other Ambulatory Visit: Payer: Self-pay | Admitting: Family Medicine

## 2021-07-13 DIAGNOSIS — I10 Essential (primary) hypertension: Secondary | ICD-10-CM

## 2021-07-28 ENCOUNTER — Encounter: Payer: 59 | Admitting: Family Medicine

## 2021-09-01 NOTE — Progress Notes (Deleted)
HPI: [name] is a 51 y.o. female, who is here today for her routine physical.  Last CPE: 07/26/20  Regular exercise: *** Following a healthful diet: ***  Chronic medical problems: Chronic pain, hypertension, bipolar 1 disorder, prediabetes, and back pain with radiculopathy among some.  Immunization History  Administered Date(s) Administered   Influenza,inj,Quad PF,6+ Mos 03/10/2018, 12/13/2020   Influenza-Unspecified 01/17/2017   PFIZER(Purple Top)SARS-COV-2 Vaccination 07/04/2020   Health Maintenance  Topic Date Due   TETANUS/TDAP  Never done   Zoster Vaccines- Shingrix (1 of 2) Never done   COVID-19 Vaccine (2 - Pfizer series) 07/25/2020   PAP SMEAR-Modifier  03/18/2021   HIV Screening  07/27/2023 (Originally 06/21/1985)   INFLUENZA VACCINE  10/31/2021   MAMMOGRAM  09/07/2022   COLONOSCOPY (Pts 45-65yr Insurance coverage will need to be confirmed)  07/05/2031   Hepatitis C Screening  Completed   HPV VACCINES  Aged Out   She has *** concerns today. Lab Results  Component Value Date   HGBA1C 6.1 05/09/2021   Lab Results  Component Value Date   CHOL 189 07/26/2020   HDL 50.70 07/26/2020   LDLCALC 112 (H) 07/26/2020   TRIG 132.0 07/26/2020   CHOLHDL 4 07/26/2020   Lab Results  Component Value Date   CREATININE 0.63 05/09/2021   BUN 12 05/09/2021   NA 140 05/09/2021   K 3.7 05/09/2021   CL 102 05/09/2021   CO2 33 (H) 05/09/2021   Review of Systems  Current Outpatient Medications on File Prior to Visit  Medication Sig Dispense Refill   amLODipine-benazepril (LOTREL) 5-20 MG capsule TAKE 1 CAPSULE BY MOUTH DAILY 90 capsule 1   DULoxetine (CYMBALTA) 30 MG capsule Take 1 capsule (30 mg total) by mouth daily. 30 capsule 1   gabapentin (NEURONTIN) 300 MG capsule Take 1 capsule (300 mg total) by mouth 2 (two) times daily. 90 capsule 1   hydrochlorothiazide (HYDRODIURIL) 25 MG tablet TAKE 1 TABLET(25 MG) BY MOUTH DAILY 90 tablet 2   methocarbamol  (ROBAXIN) 500 MG tablet Take 1 tablet (500 mg total) by mouth 2 (two) times daily as needed for muscle spasms. 60 tablet 1   Multiple Vitamins-Minerals (MULTIVITAMIN WITH MINERALS) tablet Take 1 tablet by mouth daily.     nicotine (NICODERM CQ - DOSED IN MG/24 HOURS) 14 mg/24hr patch Place 1 patch (14 mg total) onto the skin daily. 28 patch 1   omeprazole (PRILOSEC) 20 MG capsule Take 1 capsule (20 mg total) by mouth daily. 90 capsule 2   ondansetron (ZOFRAN) 4 MG tablet Take 1 tablet (4 mg total) by mouth every 6 (six) hours. (Patient not taking: Reported on 06/13/2021) 12 tablet 0   QUEtiapine (SEROQUEL) 100 MG tablet Take 0.5 tablets (50 mg total) by mouth at bedtime. 90 tablet 1   Current Facility-Administered Medications on File Prior to Visit  Medication Dose Route Frequency Provider Last Rate Last Admin   0.9 %  sodium chloride infusion  500 mL Intravenous Once BThornton Park MD       Past Medical History:  Diagnosis Date   Abnormal perimenopausal bleeding    Anxiety    Cervical dysplasia    Depression    GERD (gastroesophageal reflux disease)    Head trauma    Pregnancy induced hypertension    [place]    [place]    Shingles    STD (sexually transmitted disease)    Gonorrhea   Past Surgical History:  Procedure Laterality  Date   BICEPS TENDON REPAIR     CESAREAN SECTION     DILATATION & CURETTAGE/HYSTEROSCOPY WITH MYOSURE N/A 05/23/2018   Procedure: DILATATION & CURETTAGE/HYSTEROSCOPY;  Surgeon: Anastasio Auerbach, MD;  Location: [facility];  Service: Gynecology;  Laterality: N/A;   SHOULDER SURGERY     WISDOM TOOTH EXTRACTION     Allergies  Allergen Reactions   Tramadol    Family History  Problem Relation Age of Onset   Healthy Mother    Healthy Father    Esophageal cancer Paternal Uncle    Cancer Maternal Grandfather        Lung   Colon cancer Neg Hx    Colon polyps Neg Hx    Rectal cancer Neg Hx    Stomach  cancer Neg Hx     Social History   Socioeconomic History   Marital status: Single    Spouse name: Not on file   Number of children: Not on file   Years of education: Not on file   Highest education level: Not on file  Occupational History   Not on file  Tobacco Use   Smoking status: Some Days    Packs/day: 0.10    Years: 20.00    Pack years: 2.00    Types: Cigarettes    Start date: 08/31/2016   Smokeless tobacco: Never   Tobacco comments:    quit 04-19-2018  Vaping Use   Vaping Use: Never used  Substance and Sexual Activity   Alcohol use: Yes    Comment: occasionaly   Drug use: Yes    Types: Marijuana    Comment: last use 07-03-21 at 6pm   Sexual activity: Yes    Comment: 1st intercourse 73 yrs-5 partners  Other Topics Concern   Not on file  Social History Narrative   Lives with godson, daughter and grandson      Lives in a two story home.      Right handed      Highest level of education- 12 grade      Disabled      Social Determinants of Health   Financial Resource Strain: Medium Risk   Difficulty of Paying Living Expenses: Somewhat hard  Food Insecurity: Food Insecurity Present   Worried About Charity fundraiser in the Last Year: Sometimes true   Ran Out of Food in the Last Year: Sometimes true  Transportation Needs: No Transportation Needs   Lack of Transportation (Medical): No   Lack of Transportation (Non-Medical): No  Physical Activity: Insufficiently Active   Days of Exercise per Week: 3 days   Minutes of Exercise per Session: 10 min  Stress: Stress Concern Present   Feeling of Stress : To some extent  Social Connections: Moderately Integrated   Frequency of Communication with Friends and Family: Three times a week   Frequency of Social Gatherings with Friends and Family: Three times a week   Attends Religious Services: More than 4 times per year   Active Member of Clubs or Organizations: Yes   Attends Archivist Meetings: More than 4  times per year   Marital Status: Never married    There were no vitals filed for this visit. There is no height or weight on file to calculate BMI.  Wt Readings from Last 3 Encounters:  07/04/21 155 lb (70.3 kg)  06/13/21 155 lb (70.3 kg)  05/19/21 155 lb 4.8 oz (70.4 kg)   Physical Exam  ASSESSMENT AND PLAN:  Ms. Patricia [name] was here today annual physical examination.  No orders of the defined types were placed in this encounter.  There are no diagnoses linked to this encounter.  No problem-specific Assessment & Plan notes found for this encounter.   No follow-ups on file.  [name], MD  [facility]. [place] office.

## 2021-09-04 ENCOUNTER — Encounter: Payer: 59 | Admitting: Family Medicine

## 2021-09-04 DIAGNOSIS — R7303 Prediabetes: Secondary | ICD-10-CM

## 2021-09-04 DIAGNOSIS — Z13220 Encounter for screening for lipoid disorders: Secondary | ICD-10-CM

## 2021-09-04 DIAGNOSIS — Z0000 Encounter for general adult medical examination without abnormal findings: Secondary | ICD-10-CM

## 2021-09-04 DIAGNOSIS — I10 Essential (primary) hypertension: Secondary | ICD-10-CM

## 2021-09-12 ENCOUNTER — Other Ambulatory Visit: Payer: Self-pay | Admitting: Family Medicine

## 2021-09-12 DIAGNOSIS — M792 Neuralgia and neuritis, unspecified: Secondary | ICD-10-CM

## 2021-09-12 DIAGNOSIS — M542 Cervicalgia: Secondary | ICD-10-CM

## 2021-12-02 ENCOUNTER — Ambulatory Visit
Admission: EM | Admit: 2021-12-02 | Discharge: 2021-12-02 | Disposition: A | Discharge status: Home / Self Care | Payer: Medicare Other | Attending: Physician Assistant | Admitting: Physician Assistant

## 2021-12-02 ENCOUNTER — Encounter: Payer: Self-pay | Admitting: Physician Assistant

## 2021-12-02 DIAGNOSIS — L03011 Cellulitis of right finger: Secondary | ICD-10-CM

## 2021-12-02 MED ORDER — DOXYCYCLINE HYCLATE 100 MG PO CAPS
100.0000 mg | ORAL_CAPSULE | Freq: Two times a day (BID) | ORAL | 0 refills | Status: DC
Start: 2021-12-02 — End: 2022-04-13

## 2021-12-02 NOTE — ED Triage Notes (Signed)
Pt said x 3 days has been having pain in her middle finger in the nail bed area. There is swelling and drainage around the nail bed.

## 2021-12-02 NOTE — ED Provider Notes (Signed)
[facility]    CSN: [id number] Arrival date & time: 12/02/21  1505      History   Chief Complaint Chief Complaint  Patient presents with   Hand Pain    HPI Patricia [name] is a 51 y.o. female.   Patient here today for evaluation of pain and swelling to her right middle finger around her nail. She denies any known drainage. She has not had any fever or other symptoms. She does not report injury.   The history is provided by the patient.    Past Medical History:  Diagnosis Date   Abnormal perimenopausal bleeding    Anxiety    Cervical dysplasia    Depression    GERD (gastroesophageal reflux disease)    Head trauma    Pregnancy induced hypertension    Rheumatoid arthritis ([place])    Seizure disorder ([place])    Shingles    STD (sexually transmitted disease)    Gonorrhea    Patient Active Problem List   Diagnosis Date Noted   Prediabetes 12/13/2020   Nicotine dependence, cigarettes, in remission 01/20/2019   Neuropathic pain of lower extremity 06/30/2018   Occipital headache 06/30/2018   Tobacco use disorder 11/04/2017   Hypertension, essential, benign 08/22/2017   Class 1 obesity with body mass index (BMI) of 31.0 to 31.9 in adult 08/22/2017   Spondylosis without myelopathy or radiculopathy, lumbar region 07/15/2017   Chronic pain disorder 04/29/2017   Lower back pain 10/15/2016   Cervicalgia 08/06/2016   Bipolar 1 disorder, depressed ([place]) 08/06/2016   Adhesive capsulitis of left shoulder 05/31/2016    Past Surgical History:  Procedure Laterality Date   BICEPS TENDON REPAIR     CESAREAN SECTION     DILATATION & CURETTAGE/HYSTEROSCOPY WITH MYOSURE N/A 05/23/2018   Procedure: DILATATION & CURETTAGE/HYSTEROSCOPY;  Surgeon: Anastasio Auerbach, MD;  Location: [facility];  Service: Gynecology;  Laterality: N/A;   SHOULDER SURGERY     WISDOM TOOTH EXTRACTION      OB History     Gravida  4   Para  4   Term  3   Preterm  1    AB      Living  4      SAB      IAB      Ectopic      Multiple      Live Births               Home Medications    Prior to Admission medications   Medication Sig Start Date End Date Taking? Authorizing Provider  doxycycline (VIBRAMYCIN) 100 MG capsule Take 1 capsule (100 mg total) by mouth 2 (two) times daily. 12/02/21  Yes Francene Finders, PA-C  amLODipine-benazepril (LOTREL) 5-20 MG capsule TAKE 1 CAPSULE BY MOUTH DAILY 07/14/21   Martinique, Betty G, MD  DULoxetine (CYMBALTA) 30 MG capsule Take 1 capsule (30 mg total) by mouth daily. 05/09/21   Martinique, Betty G, MD  gabapentin (NEURONTIN) 300 MG capsule TAKE 1 CAPSULE(300 MG) BY MOUTH THREE TIMES DAILY 09/12/21   Martinique, Betty G, MD  hydrochlorothiazide (HYDRODIURIL) 25 MG tablet TAKE 1 TABLET(25 MG) BY MOUTH DAILY 07/30/20   Martinique, Betty G, MD  methocarbamol (ROBAXIN) 500 MG tablet Take 1 tablet (500 mg total) by mouth 2 (two) times daily as needed for muscle spasms. 05/09/21   Martinique, Betty G, MD  Multiple Vitamins-Minerals (MULTIVITAMIN WITH MINERALS) tablet Take 1 tablet by mouth daily.  [provider]  nicotine (NICODERM CQ - DOSED IN MG/24 HOURS) 14 mg/24hr patch Place 1 patch (14 mg total) onto the skin daily. 05/09/21   Martinique, Betty G, MD  omeprazole (PRILOSEC) 20 MG capsule Take 1 capsule (20 mg total) by mouth daily. 05/22/21   Martinique, Betty G, MD  ondansetron (ZOFRAN) 4 MG tablet Take 1 tablet (4 mg total) by mouth every 6 (six) hours. Patient not taking: Reported on 06/13/2021 11/26/19   Faustino Congress, NP  QUEtiapine (SEROQUEL) 100 MG tablet Take 0.5 tablets (50 mg total) by mouth at bedtime. 12/13/20   Martinique, Betty G, MD    Family History Family History  Problem Relation Age of Onset   Healthy Mother    Healthy Father    Esophageal cancer Paternal Uncle    Cancer Maternal Grandfather        Lung   Colon cancer Neg Hx    Colon polyps Neg Hx    Rectal cancer Neg Hx    Stomach cancer Neg Hx      Social History Social History   Tobacco Use   Smoking status: Some Days    Packs/day: 0.10    Years: 20.00    Total pack years: 2.00    Types: Cigarettes    Start date: 08/31/2016   Smokeless tobacco: Never   Tobacco comments:    quit 04-19-2018  Vaping Use   Vaping Use: Never used  Substance Use Topics   Alcohol use: Yes    Comment: occasionaly   Drug use: Yes    Types: Marijuana    Comment: last use 07-03-21 at 6pm     Allergies   Tramadol   Review of Systems Review of Systems  Constitutional:  Negative for chills and fever.  Eyes:  Negative for discharge and redness.  Gastrointestinal:  Negative for abdominal pain, nausea and vomiting.  Skin:  Positive for color change. Negative for wound.  Neurological:  Negative for numbness.     Physical Exam Triage Vital Signs ED Triage Vitals  Enc Vitals Group     BP      Pulse      Resp      Temp      Temp src      SpO2      Weight      Height      Head Circumference      Peak Flow      Pain Score      Pain Loc      Pain Edu?      Excl. in [place]?    No data found.  Updated Vital Signs BP (!) 134/95 (BP Location: Right Arm)   Pulse 72   Temp 98.6 F (37 C) (Oral)   Resp 16   LMP 07/30/2016 (LMP Unknown)   SpO2 97%     Physical Exam Vitals and nursing note reviewed.  Constitutional:      General: She is not in acute distress.    Appearance: Normal appearance. She is not ill-appearing.  HENT:     Head: Normocephalic and atraumatic.  Eyes:     Conjunctiva/sclera: Conjunctivae normal.  Cardiovascular:     Rate and Rhythm: Normal rate.  Pulmonary:     Effort: Pulmonary effort is normal.  Skin:    Findings: Erythema present.     Comments: Mild erythema and swelling to right middle cuticle, no active drainage or bleeding, exquisitely tender to touch  Neurological:  Mental Status: She is alert.     Comments: Gross sensation intact to distal right middle finger  Psychiatric:        Mood and  Affect: Mood normal.        Behavior: Behavior normal.        Thought Content: Thought content normal.      UC Treatments / Results  Labs (all labs ordered are listed, but only abnormal results are displayed) Labs Reviewed - No data to display  EKG   Radiology No results found.  Procedures Procedures (including critical care time)  Medications Ordered in UC Medications - No data to display  Initial Impression / Assessment and Plan / UC Course  I have reviewed the triage vital signs and the nursing notes.  Pertinent labs & imaging results that were available during my care of the patient were reviewed by me and considered in my medical decision making (see chart for details).    Discussed options for treatment including incision and drainage vs antibiotic treatment. Patient reports she does not think she can tolerate incision and drainage at this time- will trial oral antibiotics and encouraged warm soaks to promote drainage. Discussed that if symptoms do not improve or worsen she will need incision and drainage and should return for follow up. Patient expresses understanding.   Final Clinical Impressions(s) / UC Diagnoses   Final diagnoses:  Paronychia of right middle finger   Discharge Instructions   None    ED Prescriptions     Medication Sig Dispense Auth. Provider   doxycycline (VIBRAMYCIN) 100 MG capsule Take 1 capsule (100 mg total) by mouth 2 (two) times daily. 20 capsule Francene Finders, PA-C      PDMP not reviewed this encounter.   Francene Finders, PA-C 12/02/21 1540

## 2021-12-18 NOTE — Progress Notes (Deleted)
HPI: [name] is a 51 y.o. female, who is here today for her routine physical.  Last CPE: 07/26/20  Regular exercise 3 or more time per week: *** Following a healthy diet: ***  Chronic medical problems: ***  Immunization History  Administered Date(s) Administered   Influenza,inj,Quad PF,6+ Mos 03/10/2018, 12/13/2020   Influenza-Unspecified 01/17/2017   PFIZER(Purple Top)SARS-COV-2 Vaccination 07/04/2020   Health Maintenance  Topic Date Due   TETANUS/TDAP  Never done   Zoster Vaccines- Shingrix (1 of 2) Never done   COVID-19 Vaccine (2 - Pfizer series) 08/29/2020   PAP SMEAR-Modifier  03/18/2021   INFLUENZA VACCINE  10/31/2021   HIV Screening  07/27/2023 (Originally 06/21/1985)   MAMMOGRAM  09/07/2022   COLONOSCOPY (Pts 45-51yr Insurance coverage will need to be confirmed)  07/05/2031   Hepatitis C Screening  Completed   HPV VACCINES  Aged Out   Lab Results  Component Value Date   CHOL 189 07/26/2020   HDL 50.70 07/26/2020   LDLCALC 112 (H) 07/26/2020   TRIG 132.0 07/26/2020   CHOLHDL 4 07/26/2020   Lab Results  Component Value Date   CREATININE 0.63 05/09/2021   BUN 12 05/09/2021   NA 140 05/09/2021   K 3.7 05/09/2021   CL 102 05/09/2021   CO2 33 (H) 05/09/2021   Lab Results  Component Value Date   HGBA1C 6.1 05/09/2021    She has *** concerns today.  Review of Systems  Current Outpatient Medications on File Prior to Visit  Medication Sig Dispense Refill   amLODipine-benazepril (LOTREL) 5-20 MG capsule TAKE 1 CAPSULE BY MOUTH DAILY 90 capsule 1   doxycycline (VIBRAMYCIN) 100 MG capsule Take 1 capsule (100 mg total) by mouth 2 (two) times daily. 20 capsule 0   DULoxetine (CYMBALTA) 30 MG capsule Take 1 capsule (30 mg total) by mouth daily. 30 capsule 1   gabapentin (NEURONTIN) 300 MG capsule TAKE 1 CAPSULE(300 MG) BY MOUTH THREE TIMES DAILY 90 capsule 1   hydrochlorothiazide (HYDRODIURIL) 25 MG tablet TAKE 1 TABLET(25 MG) BY MOUTH DAILY 90  tablet 2   methocarbamol (ROBAXIN) 500 MG tablet Take 1 tablet (500 mg total) by mouth 2 (two) times daily as needed for muscle spasms. 60 tablet 1   Multiple Vitamins-Minerals (MULTIVITAMIN WITH MINERALS) tablet Take 1 tablet by mouth daily.     nicotine (NICODERM CQ - DOSED IN MG/24 HOURS) 14 mg/24hr patch Place 1 patch (14 mg total) onto the skin daily. 28 patch 1   omeprazole (PRILOSEC) 20 MG capsule Take 1 capsule (20 mg total) by mouth daily. 90 capsule 2   ondansetron (ZOFRAN) 4 MG tablet Take 1 tablet (4 mg total) by mouth every 6 (six) hours. (Patient not taking: Reported on 06/13/2021) 12 tablet 0   QUEtiapine (SEROQUEL) 100 MG tablet Take 0.5 tablets (50 mg total) by mouth at bedtime. 90 tablet 1   Current Facility-Administered Medications on File Prior to Visit  Medication Dose Route Frequency Provider Last Rate Last Admin   0.9 %  sodium chloride infusion  500 mL Intravenous Once BThornton Park MD         Past Medical History:  Diagnosis Date   Abnormal perimenopausal bleeding    Anxiety    Cervical dysplasia    Depression    GERD (gastroesophageal reflux disease)    Head trauma    Pregnancy induced hypertension    [place]    [place]    Shingles  STD (sexually transmitted disease)    Gonorrhea    Past Surgical History:  Procedure Laterality Date   BICEPS TENDON REPAIR     CESAREAN SECTION     DILATATION & CURETTAGE/HYSTEROSCOPY WITH MYOSURE N/A 05/23/2018   Procedure: DILATATION & CURETTAGE/HYSTEROSCOPY;  Surgeon: Anastasio Auerbach, MD;  Location: [facility];  Service: Gynecology;  Laterality: N/A;   SHOULDER SURGERY     WISDOM TOOTH EXTRACTION      Allergies  Allergen Reactions   Tramadol     Family History  Problem Relation Age of Onset   Healthy Mother    Healthy Father    Esophageal cancer Paternal Uncle    Cancer Maternal Grandfather        Lung   Colon cancer Neg Hx    Colon polyps Neg Hx     Rectal cancer Neg Hx    Stomach cancer Neg Hx     Social History   Socioeconomic History   Marital status: Single    Spouse name: Not on file   Number of children: Not on file   Years of education: Not on file   Highest education level: Not on file  Occupational History   Not on file  Tobacco Use   Smoking status: Some Days    Packs/day: 0.10    Years: 20.00    Total pack years: 2.00    Types: Cigarettes    Start date: 08/31/2016   Smokeless tobacco: Never   Tobacco comments:    quit 04-19-2018  Vaping Use   Vaping Use: Never used  Substance and Sexual Activity   Alcohol use: Yes    Comment: occasionaly   Drug use: Yes    Types: Marijuana    Comment: last use 07-03-21 at 6pm   Sexual activity: Yes    Comment: 1st intercourse 52 yrs-5 partners  Other Topics Concern   Not on file  Social History Narrative   Lives with godson, daughter and grandson      Lives in a two story home.      Right handed      Highest level of education- 12 grade      Disabled      Social Determinants of Health   Financial Resource Strain: Medium Risk (05/19/2021)   Overall Financial Resource Strain (CARDIA)    Difficulty of Paying Living Expenses: Somewhat hard  Food Insecurity: Food Insecurity Present (05/19/2021)   Hunger Vital Sign    Worried About Running Out of Food in the Last Year: Sometimes true    Ran Out of Food in the Last Year: Sometimes true  Transportation Needs: No Transportation Needs (05/19/2021)   PRAPARE - Hydrologist (Medical): No    Lack of Transportation (Non-Medical): No  Physical Activity: Insufficiently Active (05/19/2021)   Exercise Vital Sign    Days of Exercise per Week: 3 days    Minutes of Exercise per Session: 10 min  Stress: Stress Concern Present (05/19/2021)   [place]    Feeling of Stress : To some extent  Social Connections: Moderately Integrated  (05/19/2021)   Social Connection and Isolation Panel [NHANES]    Frequency of Communication with Friends and Family: Three times a week    Frequency of Social Gatherings with Friends and Family: Three times a week    Attends Religious Services: More than 4 times per year    Active Member of Clubs or  Organizations: Yes    Attends Music therapist: More than 4 times per year    Marital Status: Never married    There were no vitals filed for this visit. There is no height or weight on file to calculate BMI.  Wt Readings from Last 3 Encounters:  07/04/21 155 lb (70.3 kg)  06/13/21 155 lb (70.3 kg)  05/19/21 155 lb 4.8 oz (70.4 kg)     Physical Exam  ASSESSMENT AND PLAN:  Ms. Patricia [name] was here today annual physical examination.  No orders of the defined types were placed in this encounter.   There are no diagnoses linked to this encounter.  No problem-specific Assessment & Plan notes found for this encounter.   No follow-ups on file.  [name], MD  [facility]. [place] office.

## 2021-12-19 ENCOUNTER — Encounter: Payer: Medicare Other | Admitting: Family Medicine

## 2021-12-19 DIAGNOSIS — R7303 Prediabetes: Secondary | ICD-10-CM

## 2021-12-19 DIAGNOSIS — Z0000 Encounter for general adult medical examination without abnormal findings: Secondary | ICD-10-CM

## 2021-12-19 DIAGNOSIS — I10 Essential (primary) hypertension: Secondary | ICD-10-CM

## 2021-12-28 ENCOUNTER — Other Ambulatory Visit: Payer: Self-pay | Admitting: Family Medicine

## 2021-12-28 DIAGNOSIS — M542 Cervicalgia: Secondary | ICD-10-CM

## 2022-02-07 DIAGNOSIS — G500 Trigeminal neuralgia: Secondary | ICD-10-CM | POA: Diagnosis not present

## 2022-02-07 DIAGNOSIS — H2513 Age-related nuclear cataract, bilateral: Secondary | ICD-10-CM | POA: Diagnosis not present

## 2022-03-23 DIAGNOSIS — M5416 Radiculopathy, lumbar region: Secondary | ICD-10-CM | POA: Diagnosis not present

## 2022-03-23 DIAGNOSIS — M792 Neuralgia and neuritis, unspecified: Secondary | ICD-10-CM | POA: Diagnosis not present

## 2022-03-23 DIAGNOSIS — M47816 Spondylosis without myelopathy or radiculopathy, lumbar region: Secondary | ICD-10-CM | POA: Diagnosis not present

## 2022-03-23 DIAGNOSIS — M47812 Spondylosis without myelopathy or radiculopathy, cervical region: Secondary | ICD-10-CM | POA: Diagnosis not present

## 2022-03-28 ENCOUNTER — Other Ambulatory Visit: Payer: Self-pay | Admitting: Family Medicine

## 2022-03-28 DIAGNOSIS — I10 Essential (primary) hypertension: Secondary | ICD-10-CM

## 2022-03-28 DIAGNOSIS — M542 Cervicalgia: Secondary | ICD-10-CM

## 2022-03-28 DIAGNOSIS — M792 Neuralgia and neuritis, unspecified: Secondary | ICD-10-CM

## 2022-04-11 NOTE — Progress Notes (Unsigned)
HPI: Patricia [name] is a 52 y.o. female with past medical history significant for hypertension, chronic pain, tobacco use disorder, prediabetes, and hyperlipidemia here today for her routine physical.  Last CPE: 07/26/20  She reports walking for exercise two days per week for about 30 minutes. The patient admits to not eating vegetables daily but consumes pinto beans, salad, and lettuce frequently. She cooks lightly due to difficulty holding up a big pot and eats out most of the time.  The patient experiences difficulty sleeping, averaging four to five hours per night, with tossing and turning throughout. She quit smoking seven months ago and denies alcohol consumption. Her last mammogram was in June 2022, and her last colonoscopy was in April of the previous year, with a recommended follow-up in three years.  Immunization History  Administered Date(s) Administered   Influenza,inj,Quad PF,6+ Mos 03/10/2018, 12/13/2020   Influenza-Unspecified 01/17/2017   PFIZER(Purple Top)SARS-COV-2 Vaccination 07/04/2020   Tdap 08/09/2020   Health Maintenance  Topic Date Due   Medicare Annual Wellness (AWV)  05/19/2022   COVID-19 Vaccine (2 - 2023-24 season) 04/29/2022 (Originally 12/01/2021)   INFLUENZA VACCINE  07/01/2022 (Originally 10/31/2021)   Zoster Vaccines- Shingrix (1 of 2) 07/13/2022 (Originally 06/22/2019)   HIV Screening  07/27/2023 (Originally 06/21/1985)   MAMMOGRAM  09/07/2022   PAP SMEAR-Modifier  03/19/2023   DTaP/Tdap/Td (2 - Td or Tdap) 08/10/2030   COLONOSCOPY (Pts 45-31yr Insurance coverage will need to be confirmed)  07/05/2031   Hepatitis C Screening  Completed   HPV VACCINES  Aged Out   She has received a flu shot this season but has not had a shingles vaccine. The patient [facility] She is currently taking amlodipine, venlafaxine, and hydrochlorothiazide for blood pressure management, which has been stable in the 120s. The patient has  not seen a psychiatrist and is being referred for a consultation.  The patient had a hysterectomy and no longer requires pap smears. She is following up with pain management, with her next appointment in eight weeks. The patient's last eye exam was a month ago, and she has new glasses. She consumes 4-5 cups of coffee with cream and sugar daily, and her last A1c was borderline. She has *** concerns today.  Review of Systems  Constitutional:  Positive for fatigue. Negative for activity change, appetite change and fever.  HENT:  Negative for hearing loss, mouth sores and sore throat.   Eyes:  Negative for redness and visual disturbance.  Respiratory:  Negative for cough, shortness of breath and wheezing.   Cardiovascular:  Negative for chest pain and leg swelling.  Gastrointestinal:  Negative for abdominal pain, nausea and vomiting.       No changes in bowel habits.  Endocrine: Negative for cold intolerance, heat intolerance, polydipsia, polyphagia and polyuria.  Genitourinary:  Negative for decreased urine volume, dysuria, hematuria, vaginal bleeding and vaginal discharge.  Musculoskeletal:  Positive for arthralgias, back pain and neck pain. Negative for gait problem.  Skin:  Negative for color change and rash.  Allergic/Immunologic: Negative for environmental allergies.  Neurological:  Negative for seizures, syncope, weakness and headaches.  Hematological:  Negative for adenopathy. Does not bruise/bleed easily.  Psychiatric/Behavioral:  Negative for confusion and hallucinations. The patient is nervous/anxious.   All other systems reviewed and are negative.  Current Outpatient Medications on File Prior to Visit  Medication Sig Dispense Refill   amLODipine-benazepril (LOTREL) 5-20 MG capsule TAKE 1 CAPSULE BY MOUTH DAILY 90 capsule 1  DULoxetine (CYMBALTA) 30 MG capsule Take 1 capsule (30 mg total) by mouth daily. 30 capsule 1   gabapentin (NEURONTIN) 300 MG capsule TAKE 1 CAPSULE(300 MG) BY  MOUTH THREE TIMES DAILY 90 capsule 1   hydrochlorothiazide (HYDRODIURIL) 25 MG tablet TAKE 1 TABLET(25 MG) BY MOUTH DAILY 90 tablet 2   methocarbamol (ROBAXIN) 500 MG tablet TAKE 1 TABLET(500 MG) BY MOUTH TWICE DAILY AS NEEDED FOR MUSCLE SPASMS 60 tablet 0   Multiple Vitamins-Minerals (MULTIVITAMIN WITH MINERALS) tablet Take 1 tablet by mouth daily.     omeprazole (PRILOSEC) 20 MG capsule Take 1 capsule (20 mg total) by mouth daily. 90 capsule 2   QUEtiapine (SEROQUEL) 100 MG tablet Take 0.5 tablets (50 mg total) by mouth at bedtime. 90 tablet 1   No current facility-administered medications on file prior to visit.   Past Medical History:  Diagnosis Date   Abnormal perimenopausal bleeding    Anxiety    Cervical dysplasia    Depression    GERD (gastroesophageal reflux disease)    Head trauma    Pregnancy induced hypertension    Rheumatoid arthritis ([place])    Seizure disorder ([place])    Shingles    STD (sexually transmitted disease)    Gonorrhea   Past Surgical History:  Procedure Laterality Date   BICEPS TENDON REPAIR     CESAREAN SECTION     DILATATION & CURETTAGE/HYSTEROSCOPY WITH MYOSURE N/A 05/23/2018   Procedure: DILATATION & CURETTAGE/HYSTEROSCOPY;  Surgeon: Anastasio Auerbach, MD;  Location: [facility];  Service: Gynecology;  Laterality: N/A;   SHOULDER SURGERY     WISDOM TOOTH EXTRACTION     Allergies  Allergen Reactions   Tramadol    Family History  Problem Relation Age of Onset   Healthy Mother    Healthy Father    Esophageal cancer Paternal Uncle    Cancer Maternal Grandfather        Lung   Colon cancer Neg Hx    Colon polyps Neg Hx    Rectal cancer Neg Hx    Stomach cancer Neg Hx    Social History   Socioeconomic History   Marital status: Single    Spouse name: Not on file   Number of children: Not on file   Years of education: Not on file   Highest education level: Not on file  Occupational History   Not on file  Tobacco Use    Smoking status: Some Days    Packs/day: 0.10    Years: 20.00    Total pack years: 2.00    Types: Cigarettes    Start date: 08/31/2016   Smokeless tobacco: Never   Tobacco comments:    quit 04-19-2018  Vaping Use   Vaping Use: Never used  Substance and Sexual Activity   Alcohol use: Yes    Comment: occasionaly   Drug use: Yes    Types: Marijuana    Comment: last use 07-03-21 at 6pm   Sexual activity: Yes    Comment: 1st intercourse 49 yrs-5 partners  Other Topics Concern   Not on file  Social History Narrative   Lives with godson, daughter and grandson      Lives in a two story home.      Right handed      Highest level of education- 12 grade      Disabled      Social Determinants of Health   Financial Resource Strain: Medium Risk (05/19/2021)   Overall Financial Resource  Strain (CARDIA)    Difficulty of Paying Living Expenses: Somewhat hard  Food Insecurity: Food Insecurity Present (05/19/2021)   Hunger Vital Sign    Worried About Running Out of Food in the Last Year: Sometimes true    Ran Out of Food in the Last Year: Sometimes true  Transportation Needs: No Transportation Needs (05/19/2021)   PRAPARE - Hydrologist (Medical): No    Lack of Transportation (Non-Medical): No  Physical Activity: Insufficiently Active (05/19/2021)   Exercise Vital Sign    Days of Exercise per Week: 3 days    Minutes of Exercise per Session: 10 min  Stress: Stress Concern Present (05/19/2021)   [place]    Feeling of Stress : To some extent  Social Connections: Moderately Integrated (05/19/2021)   Social Connection and Isolation Panel [NHANES]    Frequency of Communication with Friends and Family: Three times a week    Frequency of Social Gatherings with Friends and Family: Three times a week    Attends Religious Services: More than 4 times per year    Active Member of Clubs or Organizations: Yes     Attends Archivist Meetings: More than 4 times per year    Marital Status: Never married   Vitals:   04/13/22 1030  BP: 120/70  Pulse: 66  Resp: 12  Temp: 98.5 F (36.9 C)  SpO2: 99%   Body mass index is 27.48 kg/m.  Wt Readings from Last 3 Encounters:  04/13/22 150 lb 4 oz (68.2 kg)  07/04/21 155 lb (70.3 kg)  06/13/21 155 lb (70.3 kg)   Physical Exam Vitals and nursing note reviewed.  Constitutional:      General: She is not in acute distress.    Appearance: She is well-developed.  HENT:     Head: Normocephalic and atraumatic.     Right Ear: Hearing, tympanic membrane, ear canal and external ear normal.     Left Ear: Hearing, tympanic membrane, ear canal and external ear normal.     Mouth/Throat:     Mouth: Mucous membranes are moist.     Pharynx: Oropharynx is clear. Uvula midline.  Eyes:     Extraocular Movements: Extraocular movements intact.     Conjunctiva/sclera: Conjunctivae normal.     Pupils: Pupils are equal, round, and reactive to light.  Neck:     Thyroid: No thyromegaly.     Trachea: No tracheal deviation.  Cardiovascular:     Rate and Rhythm: Normal rate and regular rhythm.     Pulses:          Dorsalis pedis pulses are 2+ on the right side and 2+ on the left side.     Heart sounds: No murmur heard. Pulmonary:     Effort: Pulmonary effort is normal. No respiratory distress.     Breath sounds: Normal breath sounds.  Abdominal:     Palpations: Abdomen is soft. There is no hepatomegaly or mass.     Tenderness: There is no abdominal tenderness.  Genitourinary:    Comments: No concerns. Musculoskeletal:     Left shoulder: Decreased range of motion.     Cervical back: No bony tenderness. Pain with movement present. Decreased range of motion.     Comments: No signs of synovitis appreciated.  Lymphadenopathy:     Cervical: No cervical adenopathy.     Upper Body:     Right upper body: No supraclavicular or axillary  adenopathy.     Left  upper body: No supraclavicular or axillary adenopathy.  Skin:    General: Skin is warm.     Findings: No erythema or rash.  Neurological:     Mental Status: She is alert and oriented to person, place, and time.     Cranial Nerves: No cranial nerve deficit.     Motor: Weakness (LUE 4/5) present. No tremor.     Gait: Gait normal.     Deep Tendon Reflexes:     Reflex Scores:      Bicep reflexes are 2+ on the right side and 2+ on the left side.      Patellar reflexes are 2+ on the right side and 2+ on the left side. Psychiatric:        Mood and Affect: Mood and affect normal.     Comments: d, good eye contact.   ASSESSMENT AND PLAN: Ms. ALAENA STRADER was here today annual physical examination.  Orders Placed This Encounter  Procedures   Comprehensive metabolic panel   Hemoglobin A1c   Lipid panel   TSH   Ambulatory referral to Psychiatry    Thanvi was seen today for annual exam.  Diagnoses and all orders for this visit:  Routine general medical examination at a health care facility  Hypertension, essential, benign -     Comprehensive metabolic panel; Future -     TSH; Future  Bipolar 1 disorder, depressed ([place]) -     Ambulatory referral to Psychiatry  Prediabetes -     Comprehensive metabolic panel; Future -     Hemoglobin A1c; Future  Hyperlipidemia, unspecified hyperlipidemia type -     Lipid panel; Future    Routine general medical examination at a health care facility  Hypertension, essential, benign -     Comprehensive metabolic panel; Future -     TSH; Future  Bipolar 1 disorder, depressed ([place]) -     Ambulatory referral to Psychiatry  Prediabetes -     Comprehensive metabolic panel; Future -     Hemoglobin A1c; Future  Hyperlipidemia, unspecified hyperlipidemia type -     Lipid panel; Future    Return in 6 months (on 10/12/2022) for chronic problems.  Stephine Langbehn G. [place], MD  [facility]. [place] office.

## 2022-04-13 ENCOUNTER — Encounter: Payer: Self-pay | Admitting: Family Medicine

## 2022-04-13 ENCOUNTER — Ambulatory Visit (INDEPENDENT_AMBULATORY_CARE_PROVIDER_SITE_OTHER): Payer: Medicare Other | Admitting: Family Medicine

## 2022-04-13 VITALS — BP 120/70 | HR 66 | Temp 98.5°F | Resp 12 | Ht 62.0 in | Wt 150.2 lb

## 2022-04-13 DIAGNOSIS — F319 Bipolar disorder, unspecified: Secondary | ICD-10-CM | POA: Diagnosis not present

## 2022-04-13 DIAGNOSIS — Z0000 Encounter for general adult medical examination without abnormal findings: Secondary | ICD-10-CM | POA: Diagnosis not present

## 2022-04-13 DIAGNOSIS — I10 Essential (primary) hypertension: Secondary | ICD-10-CM | POA: Diagnosis not present

## 2022-04-13 DIAGNOSIS — R7303 Prediabetes: Secondary | ICD-10-CM | POA: Diagnosis not present

## 2022-04-13 DIAGNOSIS — E785 Hyperlipidemia, unspecified: Secondary | ICD-10-CM

## 2022-04-13 LAB — COMPREHENSIVE METABOLIC PANEL
ALT: 13 U/L (ref 0–35)
AST: 17 U/L (ref 0–37)
Albumin: 4.2 g/dL (ref 3.5–5.2)
Alkaline Phosphatase: 108 U/L (ref 39–117)
BUN: 12 mg/dL (ref 6–23)
CO2: 28 mEq/L (ref 19–32)
Calcium: 9.6 mg/dL (ref 8.4–10.5)
Chloride: 103 mEq/L (ref 96–112)
Creatinine, Ser: 0.62 mg/dL (ref 0.40–1.20)
GFR: 102.11 mL/min (ref >60.00)
Glucose, Bld: 113 mg/dL — ABNORMAL HIGH (ref 70–99)
Potassium: 3.6 mEq/L (ref 3.5–5.1)
Sodium: 140 mEq/L (ref 135–145)
Total Bilirubin: 0.5 mg/dL (ref 0.2–1.2)
Total Protein: 7.3 g/dL (ref 6.0–8.3)

## 2022-04-13 LAB — LIPID PANEL
Cholesterol: 196 mg/dL (ref 0–200)
HDL: 46.9 mg/dL (ref >39.00)
LDL Cholesterol: 129 mg/dL — ABNORMAL HIGH (ref 0–99)
NonHDL: 148.77
Total CHOL/HDL Ratio: 4
Triglycerides: 101 mg/dL (ref 0.0–149.0)
VLDL: 20.2 mg/dL (ref 0.0–40.0)

## 2022-04-13 LAB — HEMOGLOBIN A1C: Hgb A1c MFr Bld: 6.1 % (ref 4.6–6.5)

## 2022-04-13 LAB — TSH: TSH: 0.36 u[IU]/mL (ref 0.35–5.50)

## 2022-04-13 NOTE — Patient Instructions (Addendum)
A few things to remember from today's visit:  Routine general medical examination at a health care facility  Hypertension, essential, benign - Plan: Comprehensive metabolic panel, TSH  Bipolar 1 disorder, depressed ([place]), Chronic - Plan: Ambulatory referral to Psychiatry  Prediabetes - Plan: Comprehensive metabolic panel, Hemoglobin A1c  Hyperlipidemia, unspecified hyperlipidemia type - Plan: Lipid panel  Continue following with pain management. Appt with psychiatrist will be arranged.  If you need refills for medications you take chronically, please call your pharmacy. Do not use My Chart to request refills or for acute issues that need immediate attention. If you send a my chart message, it may take a few days to be addressed, specially if I am not in the office.  Please be sure medication list is accurate. If a new problem present, please set up appointment sooner than planned today.  Health Maintenance, Female Adopting a healthy lifestyle and getting preventive care are important in promoting health and wellness. Ask your health care provider about: The right schedule for you to have regular tests and exams. Things you can do on your own to prevent diseases and keep yourself healthy. What should I know about diet, weight, and exercise? Eat a healthy diet  Eat a diet that includes plenty of vegetables, fruits, low-fat dairy products, and lean protein. Do not eat a lot of foods that are high in solid fats, added sugars, or sodium. Maintain a healthy weight Body mass index (BMI) is used to identify weight problems. It estimates body fat based on height and weight. Your health care provider can help determine your BMI and help you achieve or maintain a healthy weight. Get regular exercise Get regular exercise. This is one of the most important things you can do for your health. Most adults should: Exercise for at least 150 minutes each week. The exercise should increase your heart  rate and make you sweat (moderate-intensity exercise). Do strengthening exercises at least twice a week. This is in addition to the moderate-intensity exercise. Spend less time sitting. Even light physical activity can be beneficial. Watch cholesterol and blood lipids Have your blood tested for lipids and cholesterol at 53 years of age, then have this test every 5 years. Have your cholesterol levels checked more often if: Your lipid or cholesterol levels are high. You are older than 53 years of age. You are at high risk for heart disease. What should I know about cancer screening? Depending on your health history and family history, you may need to have cancer screening at various ages. This may include screening for: Breast cancer. Cervical cancer. Colorectal cancer. Skin cancer. Lung cancer. What should I know about heart disease, diabetes, and high blood pressure? Blood pressure and heart disease High blood pressure causes heart disease and increases the risk of stroke. This is more likely to develop in people who have high blood pressure readings or are overweight. Have your blood pressure checked: Every 3-5 years if you are 48-22 years of age. Every year if you are 49 years old or older. Diabetes Have regular diabetes screenings. This checks your fasting blood sugar level. Have the screening done: Once every three years after age 7 if you are at a normal weight and have a low risk for diabetes. More often and at a younger age if you are overweight or have a high risk for diabetes. What should I know about preventing infection? Hepatitis B If you have a higher risk for hepatitis B, you should be screened for  this virus. Talk with your health care provider to find out if you are at risk for hepatitis B infection. Hepatitis C Testing is recommended for: Everyone born from [birth year] through 1966. Anyone with known risk factors for hepatitis C. Sexually transmitted infections (STIs) Get  screened for STIs, including gonorrhea and chlamydia, if: You are sexually active and are younger than 53 years of age. You are older than 53 years of age and your health care provider tells you that you are at risk for this type of infection. Your sexual activity has changed since you were last screened, and you are at increased risk for chlamydia or gonorrhea. Ask your health care provider if you are at risk. Ask your health care provider about whether you are at high risk for HIV. Your health care provider may recommend a prescription medicine to help prevent HIV infection. If you choose to take medicine to prevent HIV, you should first get tested for HIV. You should then be tested every 3 months for as long as you are taking the medicine. Pregnancy If you are about to stop having your period (premenopausal) and you may become pregnant, seek counseling before you get pregnant. Take 400 to 800 micrograms (mcg) of folic acid every day if you become pregnant. Ask for birth control (contraception) if you want to prevent pregnancy. Osteoporosis and menopause Osteoporosis is a disease in which the bones lose minerals and strength with aging. This can result in bone fractures. If you are 99 years old or older, or if you are at risk for osteoporosis and fractures, ask your health care provider if you should: Be screened for bone loss. Take a calcium or vitamin D supplement to lower your risk of fractures. Be given hormone replacement therapy (HRT) to treat symptoms of menopause. Follow these instructions at home: Alcohol use Do not drink alcohol if: Your health care provider tells you not to drink. You are pregnant, may be pregnant, or are planning to become pregnant. If you drink alcohol: Limit how much you have to: 0-1 drink a day. Know how much alcohol is in your drink. In the [place] one drink equals one 12 oz bottle of beer (355 mL), one 5 oz glass of wine (148 mL), or one 1 oz glass of hard  liquor (44 mL). Lifestyle Do not use any products that contain nicotine or tobacco. These products include cigarettes, chewing tobacco, and vaping devices, such as e-cigarettes. If you need help quitting, ask your health care provider. Do not use street drugs. Do not share needles. Ask your health care provider for help if you need support or information about quitting drugs. General instructions Schedule regular health, dental, and eye exams. Stay current with your vaccines. Tell your health care provider if: You often feel depressed. You have ever been abused or do not feel safe at home. Summary Adopting a healthy lifestyle and getting preventive care are important in promoting health and wellness. Follow your health care provider's instructions about healthy diet, exercising, and getting tested or screened for diseases. Follow your health care provider's instructions on monitoring your cholesterol and blood pressure. This information is not intended to replace advice given to you by your health care provider. Make sure you discuss any questions you have with your health care provider. Document Revised: 08/08/2020 Document Reviewed: 08/08/2020 Elsevier Patient Education  [place].

## 2022-04-13 NOTE — Assessment & Plan Note (Signed)
BP adequately controlled. Continue Amlodipine-Benazepril 5-20 mg daily and HCTZ 25 mg daily. Low salt and continue monitoring BP at home. F/U in 6 months.

## 2022-04-14 ENCOUNTER — Encounter: Payer: Self-pay | Admitting: Family Medicine

## 2022-04-14 NOTE — Assessment & Plan Note (Signed)
She has not been able to established with psychiatrist. Has not taken her medications consistently. Referral to psychiatrist placed.

## 2022-04-14 NOTE — Assessment & Plan Note (Signed)
Consistency with a healthy life style and decreasing sugar intake recommended for diabetes prevention.

## 2022-04-14 NOTE — Assessment & Plan Note (Signed)
Non pharmacologic treatment recommended for now. Further recommendations will be given according to 10 years CVD risk score and lipid panel numbers. 

## 2022-04-14 NOTE — Assessment & Plan Note (Signed)
We discussed the importance of regular physical activity and healthy diet for prevention of chronic illness and/or complications. Preventive guidelines reviewed. Vaccination up to date. Recommend getting shingrix at her pharmacy. Ca++ and vit D supplementation recommended. Next CPE in a year.

## 2022-05-18 DIAGNOSIS — M47816 Spondylosis without myelopathy or radiculopathy, lumbar region: Secondary | ICD-10-CM | POA: Diagnosis not present

## 2022-05-22 ENCOUNTER — Telehealth (INDEPENDENT_AMBULATORY_CARE_PROVIDER_SITE_OTHER): Payer: 59 | Admitting: Family Medicine

## 2022-05-22 DIAGNOSIS — Z0000 Encounter for general adult medical examination without abnormal findings: Secondary | ICD-10-CM

## 2022-05-22 NOTE — Patient Instructions (Addendum)
I really enjoyed getting to talk with you today! I am available on Tuesdays and Thursdays for virtual visits if you have any questions or concerns, or if I can be of any further assistance.   CHECKLIST FROM ANNUAL WELLNESS VISIT:  -Follow up (please call to schedule if not scheduled after visit):  -Inperson visit with the psychiatrist as advised by your doctor - please call to schedule as we discussed, contatc Dr. [place] if any difficulty setting up that appointment -yearly for annual wellness visit with primary care office  Here is a list of your preventive care/health maintenance measures and the plan for each if any are due:  Health Maintenance  Topic Date Due   COVID-19 Vaccine (2 - 2023-24 season) Due yearly, can get at pharmacy   INFLUENZA VACCINE  Due yearly, can get at pharmacy or at office   Zoster Vaccines- Shingrix (1 of 2) Can get at the pharmacy   HIV Screening  07/27/2023 (Originally 06/21/1985)   MAMMOGRAM  Please schedule ASAP   PAP SMEAR-Modifier  Please call your gynecologist   Medicare Annual Wellness (AWV)  05/23/2023   DTaP/Tdap/Td (2 - Td or Tdap) 08/10/2030   COLONOSCOPY (Pts 45-96yr Insurance coverage will need to be confirmed)  07/05/2031   Hepatitis C Screening  Completed   HPV VACCINES  Aged Out    -See a dentist at least yearly  -Get your eyes checked and then per your eye specialist's recommendations  -Other issues addressed today:   -I have included below further information regarding a healthy whole foods based diet, physical activity guidelines for adults, stress management and opportunities for social connections. I hope you find this information useful.   -----------------------------------------------------------------------------------------------------------------------------------------------------------------------------------------------------------------------------------------------------------  NUTRITION: -eat real food: lots of colorful  vegetables (half the plate) and fruits -5-7 servings of vegetables and fruits per day (fresh or steamed is best), exp. 2 servings of vegetables with lunch and dinner and 2 servings of fruit per day. Berries and greens such as kale and collards are great choices.  -consume on a regular basis: whole grains (make sure first ingredient on label contains the word "whole"), fresh fruits, fish, nuts, seeds, healthy oils (such as olive oil, avocado oil, grape seed oil) -may eat small amounts of dairy and lean meat on occasion, but avoid processed meats such as ham, bacon, lunch meat, etc. -drink water -try to avoid fast food and pre-packaged foods, processed meat -most experts advise limiting sodium to < 23065mper day, should limit further is any chronic conditions such as high blood pressure, heart disease, diabetes, etc. The American Heart Association advised that < 150032ms is ideal -try to avoid foods that contain any ingredients with names you do not recognize  -try to avoid sugar/sweets (except for the natural sugar that occurs in fresh fruit) -try to avoid sweet drinks -try to avoid white rice, white bread, pasta (unless whole grain), white or yellow potatoes  EXERCISE GUIDELINES FOR ADULTS: -if you wish to increase your physical activity, do so gradually and with the approval of your doctor -STOP and seek medical care immediately if you have any chest pain, chest discomfort or trouble breathing when starting or increasing exercise  -move and stretch your body, legs, feet and arms when sitting for long periods -Physical activity guidelines for optimal health in adults: -least 150 minutes per week of aerobic exercise (can talk, but not sing) once approved by your doctor, 20-30 minutes of sustained activity or two 10 minute episodes of sustained activity  every day.  -resistance training at least 2 days per week if approved by your doctor -balance exercises 3+ days per week:   Stand somewhere where  you have something sturdy to hold onto if you lose balance.    1) lift up on toes, start with 5x per day and work up to 20x   2) stand and lift on leg straight out to the side so that foot is a few inches of the floor, start with 5x each side and work up to 20x each side   3) stand on one foot, start with 5 seconds each side and work up to 20 seconds on each side  If you need ideas or help with getting more active:  -Silver sneakers https://tools.silversneakers.com  -Walk with a Doc: http://stephens-thompson.biz/  -try to include resistance (weight lifting/strength building) and balance exercises twice per week: or the following link for ideas: ChessContest.fr  UpdateClothing.com.cy  STRESS MANAGEMENT: -can try meditating, or just sitting quietly with deep breathing while intentionally relaxing all parts of your body for 5 minutes daily -if you need further help with stress, anxiety or depression please follow up with your primary doctor or contact the wonderful folks at [place]: [place]: -options in [place] if you wish to engage in more social and exercise related activities:  -Silver sneakers https://tools.silversneakers.com  -Walk with a Doc: http://stephens-thompson.biz/  -Check out the [place] 50+ section on the [place] [employer] (hiking clubs, book clubs, cards and games, chess, exercise classes, aquatic classes and much more) - see the website for details: https://www.[place].gov/departments/parks-recreation/active-adults50  -YouTube has lots of exercise videos for different ages and abilities as well  -[place] (a variety of indoor and outdoor inperson activities for adults). [phone]. [address]      ADVANCED HEALTHCARE DIRECTIVES:  Everyone should have advanced health care  directives in place. This is so that you get the care you want, should you ever be in a situation where you are unable to make your own medical decisions.   From the [place] Advanced Directive Website: "[place] are legal documents in which you give written instructions about your health care if, in the future, you cannot speak for yourself.   A health care power of attorney allows you to name a person you trust to make your health care decisions if you cannot make them yourself. A declaration of a desire for a natural death (or living will) is document, which states that you desire not to have your life prolonged by extraordinary measures if you have a terminal or incurable illness or if you are in a vegetative state. An advance instruction for mental health treatment makes a declaration of instructions, information and preferences regarding your mental health treatment. It also states that you are aware that the advance instruction authorizes a mental health treatment provider to act according to your wishes. It may also outline your consent or refusal of mental health treatment. A declaration of an anatomical gift allows anyone over the age of 63 to make a gift by will, organ donor card or other document."   Please see the following website or an elder law attorney for forms, FAQs and for completion of advanced directives: [place] [employer] [place] ([email or web address])  Or copy and paste the following to your web browser: [email or web address]

## 2022-05-22 NOTE — Progress Notes (Signed)
PATIENT CHECK-IN and HEALTH RISK ASSESSMENT QUESTIONNAIRE:  -completed by phone/video for upcoming Medicare Preventive Visit  Pre-Visit Check-in: 1)Vitals (height, wt, BP, etc) - record in vitals section for visit on day of visit 2)Review and Update Medications, Allergies PMH, Surgeries, Social history in Epic 3)Hospitalizations in the last year with date/reason? No  4)Review and Update Care Team (patient's specialists) in Epic 5) Complete PHQ9 in Epic  6) Complete Fall Screening in Epic 7)Review all Health Maintenance Due and order under PCP if not done.  8)Medicare Wellness Questionnaire: Answer theses question about your habits: Do you drink alcohol? No   If yes, how many drinks do you have a day? No Have you ever smoked?Yes   Quit date if applicable?  Quit 1 year ago How many packs a day do/did you smoke? 1 pack last about 3 to 4 days Do you use smokeless tobacco?No   Do you use an illicit drugs?No  Do you exercises? Walks daily - usually about 2 hours daily Are you sexually active? No Number of partners? 0 Typical breakfast:Eggs, bacon, grits Typical lunch:no lunch Typical dinner:sandwith Typical snacks:chips  Beverages: Pepsi, water, coffee, pink lemonade   Answer theses question about you: Can you perform most household chores?Yes, most of them  Do you find it hard to follow a conversation in a noisy room?Yes    Do you often ask people to speak up or repeat themselves?Yes, sometimes  Do you feel that you have a problem with memory?No  Do you balance your checkbook and or bank acounts?Yes  Do you feel safe at home?Yes Last dentist visit? 2 years  Do you need assistance with any of the following: Please note if so Yes  Driving?yes   Feeding yourself?No  Getting from bed to chair?No   Getting to the toilet?No  Bathing or showering?-No  Dressing yourself?-Yes  Managing money?No  Climbing a flight of stairs-Yes  Preparing meals?Yes  Do you have Advanced Directives in  place (Living Will, Healthcare Power or Attorney)? No   Last eye Exam and location? Nov 2023, Dr. Katy Fitch on [place] [place]   Do you currently use prescribed or non-prescribed narcotic or opioid pain medications? Patient is going to pain management.   Do you have a history or close family history of breast, ovarian, tubal or peritoneal cancer or a family member with BRCA (breast cancer susceptibility 1 and 2) gene mutations?  Nurse/Assistant Credentials/time stamp:  St   ----------------------------------------------------------------------------------------------------------------------------------------------------------------------------------------------------------------------   MEDICARE ANNUAL PREVENTIVE VISIT WITH PROVIDER: (Welcome to [employer], initial annual wellness or annual wellness exam)  Virtual Visit via Video Note  I connected with JALI COCKFIELD on 05/22/22 by a video enabled telemedicine application and verified that I am speaking with the correct person using two identifiers.  Location patient: work Environmental manager or home office Persons participating in the virtual visit: patient, provider  Concerns and/or follow up today: stable since last visit, but wishes to check on status of psychiatry referral - reports referred because her psych meds make her sleepy. Can see referral in chart and appear she was contacted twice to schedule - notes say scheduler left her a message.     See HM section in Epic for other details of completed HM.    ROS: negative for report of fevers, unintentional weight loss, vision changes, vision loss, hearing loss or change, chest pain, sob, hemoptysis, melena, hematochezia, hematuria, genital discharge or lesions, falls, bleeding or bruising, loc, thoughts of suicide or self harm, memory loss  Patient-completed  extensive health risk assessment - reviewed and discussed with the patient: See Health Risk Assessment completed with patient  prior to the visit either above or in recent phone note. This was reviewed in detailed with the patient today and appropriate recommendations, orders and referrals were placed as needed per Summary below and patient instructions.   Review of Medical History: -PMH, PSH, Family History and current specialty and care providers reviewed and updated and listed below   Patient Care Team: Martinique, Betty G, MD as PCP - General (Family Medicine)   Past Medical History:  Diagnosis Date   Abnormal perimenopausal bleeding    Anxiety    Cervical dysplasia    Depression    GERD (gastroesophageal reflux disease)    Head trauma    Pregnancy induced hypertension    Rheumatoid arthritis ([place])    Seizure disorder ([place])    Shingles    STD (sexually transmitted disease)    Gonorrhea    Past Surgical History:  Procedure Laterality Date   BICEPS TENDON REPAIR     CESAREAN SECTION     DILATATION & CURETTAGE/HYSTEROSCOPY WITH MYOSURE N/A 05/23/2018   Procedure: DILATATION & CURETTAGE/HYSTEROSCOPY;  Surgeon: Anastasio Auerbach, MD;  Location: [place];  Service: Gynecology;  Laterality: N/A;   SHOULDER SURGERY     WISDOM TOOTH EXTRACTION      Social History   Socioeconomic History   Marital status: Single    Spouse name: Not on file   Number of children: Not on file   Years of education: Not on file   Highest education level: Not on file  Occupational History   Not on file  Tobacco Use   Smoking status: Some Days    Packs/day: 0.10    Years: 20.00    Total pack years: 2.00    Types: Cigarettes    Start date: 08/31/2016   Smokeless tobacco: Never   Tobacco comments:    quit 04-19-2018  Vaping Use   Vaping Use: Never used  Substance and Sexual Activity   Alcohol use: Yes    Comment: occasionaly   Drug use: Yes    Types: Marijuana    Comment: last use 07-03-21 at 6pm   Sexual activity: Yes    Comment: 1st intercourse 68 yrs-5 partners  Other Topics Concern   Not on  file  Social History Narrative   Lives with godson, daughter and grandson      Lives in a two story home.      Right handed      Highest level of education- 12 grade      Disabled      Social Determinants of Health   Financial Resource Strain: Medium Risk (05/19/2021)   Overall Financial Resource Strain (CARDIA)    Difficulty of Paying Living Expenses: Somewhat hard  Food Insecurity: Food Insecurity Present (05/19/2021)   Hunger Vital Sign    Worried About Running Out of Food in the Last Year: Sometimes true    Ran Out of Food in the Last Year: Sometimes true  Transportation Needs: No Transportation Needs (05/19/2021)   PRAPARE - Hydrologist (Medical): No    Lack of Transportation (Non-Medical): No  Physical Activity: Insufficiently Active (05/19/2021)   Exercise Vital Sign    Days of Exercise per Week: 3 days    Minutes of Exercise per Session: 10 min  Stress: Stress Concern Present (05/19/2021)   [place]  Questionnaire    Feeling of Stress : To some extent  Social Connections: Moderately Integrated (05/19/2021)   Social Connection and Isolation Panel [NHANES]    Frequency of Communication with Friends and Family: Three times a week    Frequency of Social Gatherings with Friends and Family: Three times a week    Attends Religious Services: More than 4 times per year    Active Member of Clubs or Organizations: Yes    Attends Archivist Meetings: More than 4 times per year    Marital Status: Never married  Intimate Partner Violence: Not At Risk (05/19/2021)   Humiliation, Afraid, Rape, and Kick questionnaire    Fear of Current or Ex-Partner: No    Emotionally Abused: No    Physically Abused: No    Sexually Abused: No    Family History  Problem Relation Age of Onset   Healthy Mother    Healthy Father    Esophageal cancer Paternal Uncle    Cancer Maternal Grandfather        Lung    Colon cancer Neg Hx    Colon polyps Neg Hx    Rectal cancer Neg Hx    Stomach cancer Neg Hx     Current Outpatient Medications on File Prior to Visit  Medication Sig Dispense Refill   amLODipine-benazepril (LOTREL) 5-20 MG capsule TAKE 1 CAPSULE BY MOUTH DAILY 90 capsule 1   DULoxetine (CYMBALTA) 30 MG capsule Take 1 capsule (30 mg total) by mouth daily. 30 capsule 1   gabapentin (NEURONTIN) 300 MG capsule TAKE 1 CAPSULE(300 MG) BY MOUTH THREE TIMES DAILY 90 capsule 1   hydrochlorothiazide (HYDRODIURIL) 25 MG tablet TAKE 1 TABLET(25 MG) BY MOUTH DAILY 90 tablet 2   methocarbamol (ROBAXIN) 500 MG tablet TAKE 1 TABLET(500 MG) BY MOUTH TWICE DAILY AS NEEDED FOR MUSCLE SPASMS 60 tablet 0   Multiple Vitamins-Minerals (MULTIVITAMIN WITH MINERALS) tablet Take 1 tablet by mouth daily.     omeprazole (PRILOSEC) 20 MG capsule Take 1 capsule (20 mg total) by mouth daily. 90 capsule 2   QUEtiapine (SEROQUEL) 100 MG tablet Take 0.5 tablets (50 mg total) by mouth at bedtime. 90 tablet 1   No current facility-administered medications on file prior to visit.    Allergies  Allergen Reactions   Tramadol        Physical Exam There were no vitals filed for this visit. Estimated body mass index is 27.48 kg/m as calculated from the following:   Height as of 04/13/22: 5' 2"$  (1.575 m).   Weight as of 04/13/22: 150 lb 4 oz (68.2 kg).  EKG (optional): deferred due to virtual visit  GENERAL: alert, oriented, no acute distress detected, full vision exam deferred due to pandemic and/or virtual encounter  HEENT: atraumatic, conjunttiva clear, no obvious abnormalities on inspection of external nose and ears  NECK: normal movements of the head and neck  LUNGS: on inspection no signs of respiratory distress, breathing rate appears normal, no obvious gross SOB, gasping or wheezing  CV: no obvious cyanosis  MS: moves all visible extremities without noticeable abnormality  PSYCH/NEURO: pleasant and  cooperative, no obvious depression or anxiety, speech and thought processing grossly intact, Cognitive function grossly intact  Flowsheet Row Video Visit from 05/22/2022 in [place] [facility] Total Score 15       Discussed. Has trouble sleeping and feels psych meds make her drowsy. Discussed sleep habits at length - gets up and watches TV at  night. Lots of light exposure through the night/before sleep. No SI or severe symptoms. Referred to psych by PCP.     05/22/2022   11:05 AM 04/13/2022   11:01 AM 04/13/2022   10:36 AM 05/19/2021    3:47 PM 05/09/2021   10:30 AM  Depression screen PHQ 2/9  Decreased Interest 3 2 0 0 2  Down, Depressed, Hopeless 1 1 0 0 1  PHQ - 2 Score 4 3 0 0 3  Altered sleeping 3 3  0 3  Tired, decreased energy 3 2  0 1  Change in appetite 3 1  1 1  $ Feeling bad or failure about yourself  1 2  0 2  Trouble concentrating 1 1  0 1  Moving slowly or fidgety/restless 0 0  0 3  Suicidal thoughts 0 0  0 0  PHQ-9 Score 15 12  1 14  $ Difficult doing work/chores Somewhat difficult Somewhat difficult  Somewhat difficult Somewhat difficult       04/13/2020    2:56 PM 05/19/2021    4:00 PM 12/02/2021    3:21 PM 04/13/2022   10:35 AM 05/22/2022   11:04 AM  Fall Risk  Falls in the past year? 0 0  0 0  Was there an injury with Fall? 0 0  0 0  Fall Risk Category Calculator 0 0  0 0  Fall Risk Category (Retired) Low Low  Low   (RETIRED) Patient Fall Risk Level Low fall risk Low fall risk Low fall risk Low fall risk   Patient at Risk for Falls Due to Impaired balance/gait   No Fall Risks   Fall risk Follow up Falls evaluation completed;Falls prevention discussed   Falls evaluation completed      SUMMARY AND PLAN:  Encounter for Medicare annual wellness exam     Discussed applicable health maintenance/preventive health measures and advised and referred or ordered per patient preferences:  Health Maintenance  Topic Date Due   COVID-19  Vaccine (2 - 2023-24 season) 12/01/2021 -discussed, she plans to get, advised can get at [facility]  07/01/2022 (Originally 10/31/2021), advised can get at pharmacy or office   Zoster Vaccines- Shingrix (1 of 2) 07/13/2022 (Originally 06/22/2019), discussed, advised can get at pharmacy   HIV Screening  07/27/2023 (Originally 06/21/1985)   MAMMOGRAM  09/07/2022, advised to get yearly, offered to help schedule - she reports she will schedule   PAP SMEAR-Modifier  03/19/2023, advised to contact gyn for annual exam, she is s/p hysterectomy   Medicare Annual Wellness (AWV)  05/23/2023   DTaP/Tdap/Td (2 - Td or Tdap) 08/10/2030   COLONOSCOPY (Pts 45-57yr Insurance coverage will need to be confirmed)  07/05/2031   Hepatitis C Screening  Completed   HPV VACCINES  Aged [place] counseling on the following was provided based on the above review of health and a plan/checklist for the patient, along with additional information discussed, was provided for the patient in the patient instructions :  -Advised on importance of and resources for completing advanced directives - further info provided in patient instructions -Advised and counseled on  healthy lifestyle - including the importance of a healthy diet, regular physical activity, sleep, social connections and stress management. -provided sleep iCBT, discussed current habits, trends, lots of light exposure at night is likely causing issues with circadian rhythm, discussed screen free/dim light bedtime routine, avoiding light exposure during night (red light night light), etc, for helping to balance/reset circ.  Clock.  -Advised and counseled on a whole foods based healthy diet and regular exercise: discussed a heart healthy whole foods based diet at length. A summary of a healthy diet was provided in the Patient Instructions. -Recommended continued regular exercise and discussed options within the community to possibly add in  strength 2x per week  -Advise yearly dental visits at minimum and regular eye exams -reviewed referral info from PCP and provided her contact number for the psychiatrist she was referred to and she agrees to call and schedule   Follow up: see patient instructions     Patient Instructions  I really enjoyed getting to talk with you today! I am available on Tuesdays and Thursdays for virtual visits if you have any questions or concerns, or if I can be of any further assistance.   CHECKLIST FROM ANNUAL WELLNESS VISIT:  -Follow up (please call to schedule if not scheduled after visit):  -Inperson visit with the psychiatrist as advised by your doctor - please call to schedule as we discussed, contatc Dr. Martinique if any difficulty setting up that appointment -yearly for annual wellness visit with primary care office  Here is a list of your preventive care/health maintenance measures and the plan for each if any are due:  Health Maintenance  Topic Date Due   COVID-19 Vaccine (2 - 2023-24 season) Due yearly, can get at pharmacy   INFLUENZA VACCINE  Due yearly, can get at pharmacy or at office   Zoster Vaccines- Shingrix (1 of 2) Can get at the pharmacy   HIV Screening  07/27/2023 (Originally [birth year]1984)   MAMMOGRAM  Please schedule ASAP   PAP SMEAR-Modifier  Please call your gynecologist   Medicare Annual Wellness (AWV)  05/23/2023   DTaP/Tdap/Td (2 - Td or Tdap) 08/10/2030   COLONOSCOPY (Pts 45-82yr Insurance coverage will need to be confirmed)  07/05/2031   Hepatitis C Screening  Completed   HPV VACCINES  Aged Out    -See a dentist at least yearly  -Get your eyes checked and then per your eye specialist's recommendations  -Other issues addressed today:   -I have included below further information regarding a healthy whole foods based diet, physical activity guidelines for adults, stress management and opportunities for social connections. I hope you find this information useful.    -----------------------------------------------------------------------------------------------------------------------------------------------------------------------------------------------------------------------------------------------------------  NUTRITION: -eat real food: lots of colorful vegetables (half the plate) and fruits -5-7 servings of vegetables and fruits per day (fresh or steamed is best), exp. 2 servings of vegetables with lunch and dinner and 2 servings of fruit per day. Berries and greens such as kale and collards are great choices.  -consume on a regular basis: whole grains (make sure first ingredient on label contains the word "whole"), fresh fruits, fish, nuts, seeds, healthy oils (such as olive oil, avocado oil, grape seed oil) -may eat small amounts of dairy and lean meat on occasion, but avoid processed meats such as ham, bacon, lunch meat, etc. -drink water -try to avoid fast food and pre-packaged foods, processed meat -most experts advise limiting sodium to < 23055mper day, should limit further is any chronic conditions such as high blood pressure, heart disease, diabetes, etc. The American Heart Association advised that < 150067ms is ideal -try to avoid foods that contain any ingredients with names you do not recognize  -try to avoid sugar/sweets (except for the natural sugar that occurs in fresh fruit) -try to avoid sweet drinks -try to avoid white rice, white bread, pasta (unless  whole grain), white or yellow potatoes  EXERCISE GUIDELINES FOR ADULTS: -if you wish to increase your physical activity, do so gradually and with the approval of your doctor -STOP and seek medical care immediately if you have any chest pain, chest discomfort or trouble breathing when starting or increasing exercise  -move and stretch your body, legs, feet and arms when sitting for long periods -Physical activity guidelines for optimal health in adults: -least 150 minutes per week of  aerobic exercise (can talk, but not sing) once approved by your doctor, 20-30 minutes of sustained activity or two 10 minute episodes of sustained activity every day.  -resistance training at least 2 days per week if approved by your doctor -balance exercises 3+ days per week:   Stand somewhere where you have something sturdy to hold onto if you lose balance.    1) lift up on toes, start with 5x per day and work up to 20x   2) stand and lift on leg straight out to the side so that foot is a few inches of the floor, start with 5x each side and work up to 20x each side   3) stand on one foot, start with 5 seconds each side and work up to 20 seconds on each side  If you need ideas or help with getting more active:  -Silver sneakers https://tools.silversneakers.com  -Walk with a Doc: http://stephens-thompson.biz/  -try to include resistance (weight lifting/strength building) and balance exercises twice per week: or the following link for ideas: ChessContest.fr  UpdateClothing.com.cy  STRESS MANAGEMENT: -can try meditating, or just sitting quietly with deep breathing while intentionally relaxing all parts of your body for 5 minutes daily -if you need further help with stress, anxiety or depression please follow up with your primary doctor or contact the wonderful folks at [place]: [place]: -options in [place] if you wish to engage in more social and exercise related activities:  -Silver sneakers https://tools.silversneakers.com  -Walk with a Doc: http://stephens-thompson.biz/  -Check out the [place] 50+ section on the [place] [employer] (hiking clubs, book clubs, cards and games, chess, exercise classes, aquatic classes and much more) - see the website for  details: https://www.-[place].gov/departments/parks-recreation/active-adults50  -YouTube has lots of exercise videos for different ages and abilities as well  -[place] (a variety of indoor and outdoor inperson activities for adults). [phone]. [address]      ADVANCED HEALTHCARE DIRECTIVES:  Everyone should have advanced health care directives in place. This is so that you get the care you want, should you ever be in a situation where you are unable to make your own medical decisions.   From the [place] Advanced Directive Website: "[place] are legal documents in which you give written instructions about your health care if, in the future, you cannot speak for yourself.   A health care power of attorney allows you to name a person you trust to make your health care decisions if you cannot make them yourself. A declaration of a desire for a natural death (or living will) is document, which states that you desire not to have your life prolonged by extraordinary measures if you have a terminal or incurable illness or if you are in a vegetative state. An advance instruction for mental health treatment makes a declaration of instructions, information and preferences regarding your mental health treatment. It also states that you are aware that the advance instruction authorizes a mental health  treatment provider to act according to your wishes. It may also outline your consent or refusal of mental health treatment. A declaration of an anatomical gift allows anyone over the age of 2 to make a gift by will, organ donor card or other document."   Please see the following website or an elder law attorney for forms, FAQs and for completion of advanced directives: [place] Secretary of [place] ([email or web address])  Or copy and paste the following to your web  browser: PokerReunion.com.cy         Lucretia Kern, DO

## 2022-05-31 ENCOUNTER — Ambulatory Visit (HOSPITAL_BASED_OUTPATIENT_CLINIC_OR_DEPARTMENT_OTHER): Payer: 59 | Admitting: Student in an Organized Health Care Education/Training Program

## 2022-05-31 VITALS — BP 139/90 | HR 74 | Ht 63.0 in | Wt 147.0 lb

## 2022-05-31 DIAGNOSIS — F4310 Post-traumatic stress disorder, unspecified: Secondary | ICD-10-CM | POA: Diagnosis not present

## 2022-05-31 DIAGNOSIS — F411 Generalized anxiety disorder: Secondary | ICD-10-CM

## 2022-05-31 DIAGNOSIS — F319 Bipolar disorder, unspecified: Secondary | ICD-10-CM

## 2022-05-31 MED ORDER — QUETIAPINE FUMARATE 100 MG PO TABS: 100.0000 mg | ORAL_TABLET | Freq: Every day | ORAL | 1 refills | Status: DC
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
## 2022-06-01 ENCOUNTER — Encounter (HOSPITAL_COMMUNITY): Payer: Self-pay | Admitting: Student in an Organized Health Care Education/Training Program

## 2022-06-15 DIAGNOSIS — M47816 Spondylosis without myelopathy or radiculopathy, lumbar region: Secondary | ICD-10-CM | POA: Diagnosis not present

## 2022-06-21 ENCOUNTER — Ambulatory Visit (HOSPITAL_COMMUNITY): Payer: 59 | Admitting: Student in an Organized Health Care Education/Training Program

## 2022-07-05 ENCOUNTER — Ambulatory Visit (HOSPITAL_COMMUNITY): Payer: 59 | Admitting: Student in an Organized Health Care Education/Training Program

## 2022-07-06 ENCOUNTER — Telehealth: Payer: Self-pay | Admitting: Family Medicine

## 2022-07-06 NOTE — Telephone Encounter (Signed)
Patient wanted to let provider know that she has been experiencing urinary incontinence, says she sometimes wets per panties

## 2022-07-09 NOTE — Telephone Encounter (Signed)
I spoke with patient. She does not want to schedule an appointment right now - she just wanted you aware. I advised her to let us know if it worsens and she verbalized understanding.

## 2022-07-20 DIAGNOSIS — M5416 Radiculopathy, lumbar region: Secondary | ICD-10-CM | POA: Diagnosis not present

## 2022-07-20 DIAGNOSIS — Z79891 Long term (current) use of opiate analgesic: Secondary | ICD-10-CM | POA: Diagnosis not present

## 2022-07-20 DIAGNOSIS — M47816 Spondylosis without myelopathy or radiculopathy, lumbar region: Secondary | ICD-10-CM | POA: Diagnosis not present

## 2022-07-30 ENCOUNTER — Other Ambulatory Visit: Payer: Self-pay | Admitting: Family Medicine

## 2022-07-30 DIAGNOSIS — Z1231 Encounter for screening mammogram for malignant neoplasm of breast: Secondary | ICD-10-CM

## 2022-08-16 ENCOUNTER — Ambulatory Visit (HOSPITAL_COMMUNITY): Payer: 59 | Admitting: Student in an Organized Health Care Education/Training Program

## 2022-08-20 IMAGING — MG DIGITAL SCREENING BILAT W/ CAD
4 series · 4 of 4 positions shown · non-contrast
Comparison: Previous exam(s).

CLINICAL DATA: Screening.

EXAM:
DIGITAL SCREENING BILATERAL MAMMOGRAM WITH CAD
TECHNIQUE: Bilateral screening digital craniocaudal and mediolateral oblique
mammograms were obtained. The images were evaluated with
computer-aided detection.

[L CC]
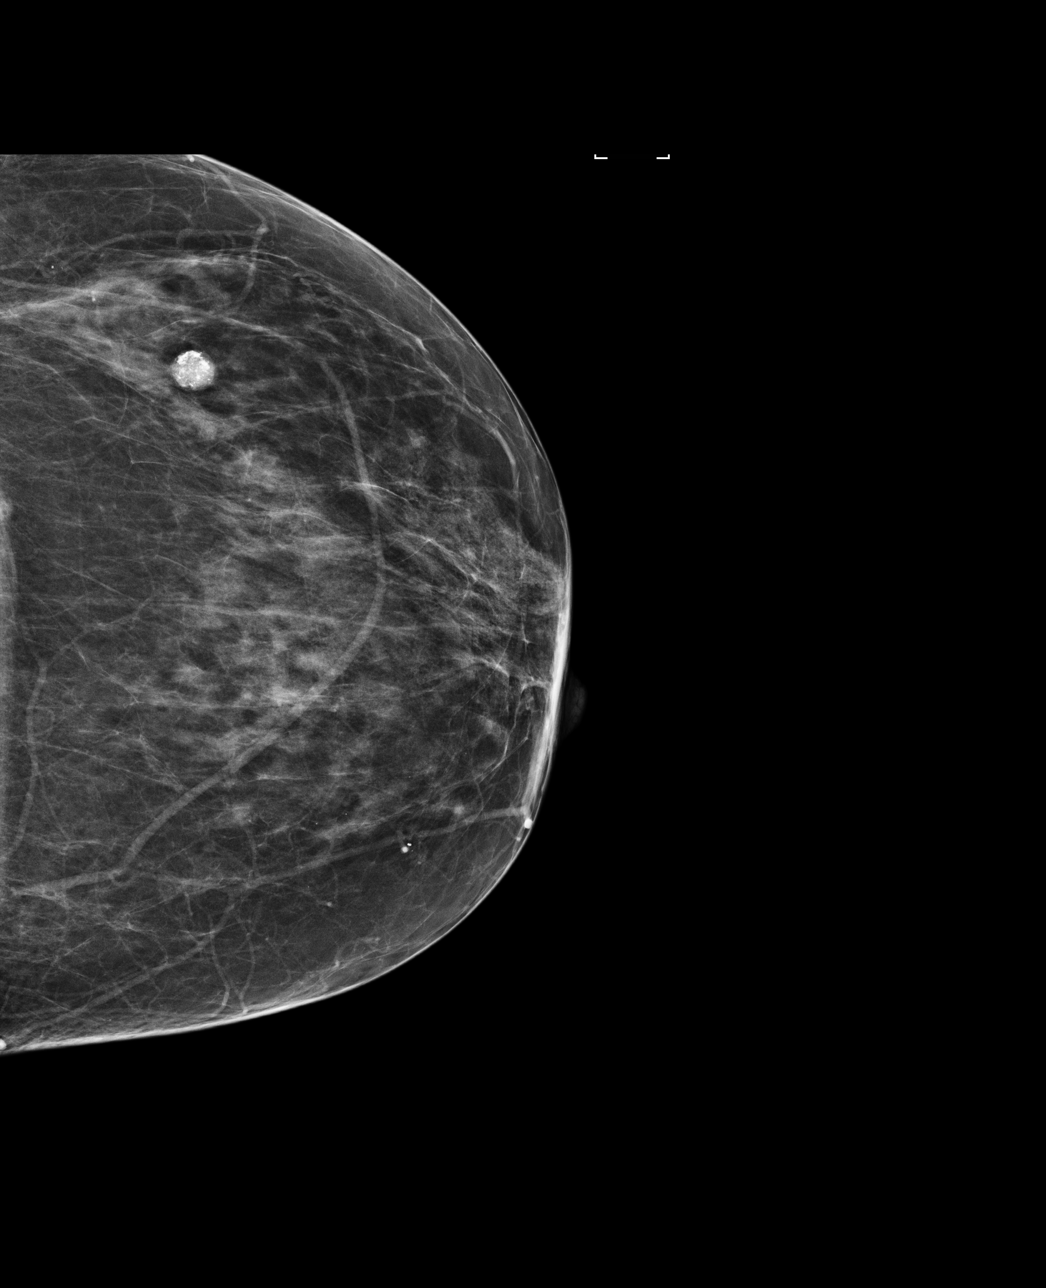

[R MLO]
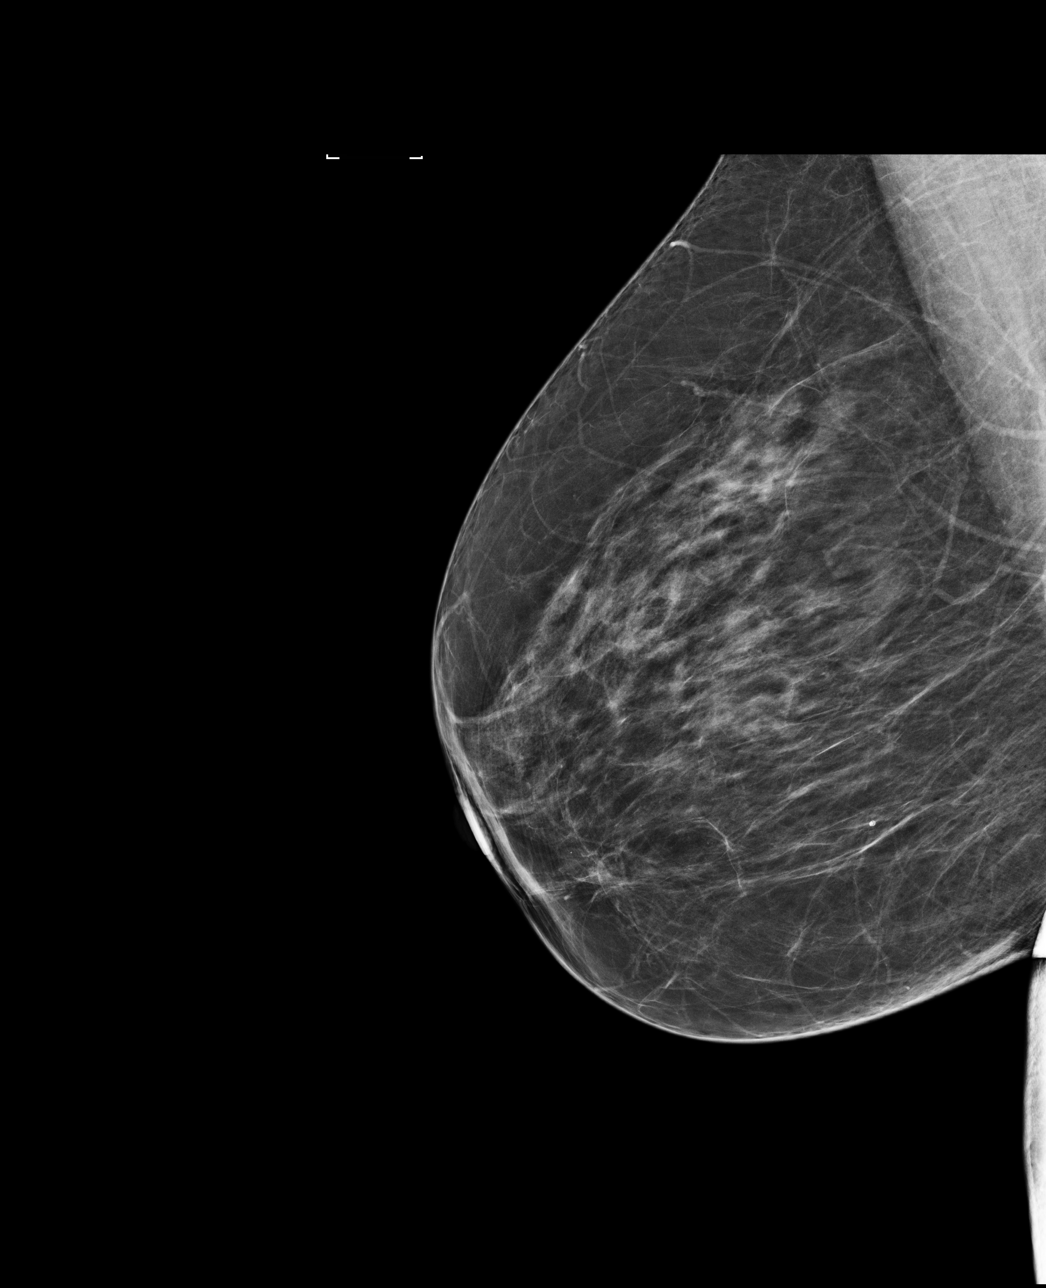

[R CC]
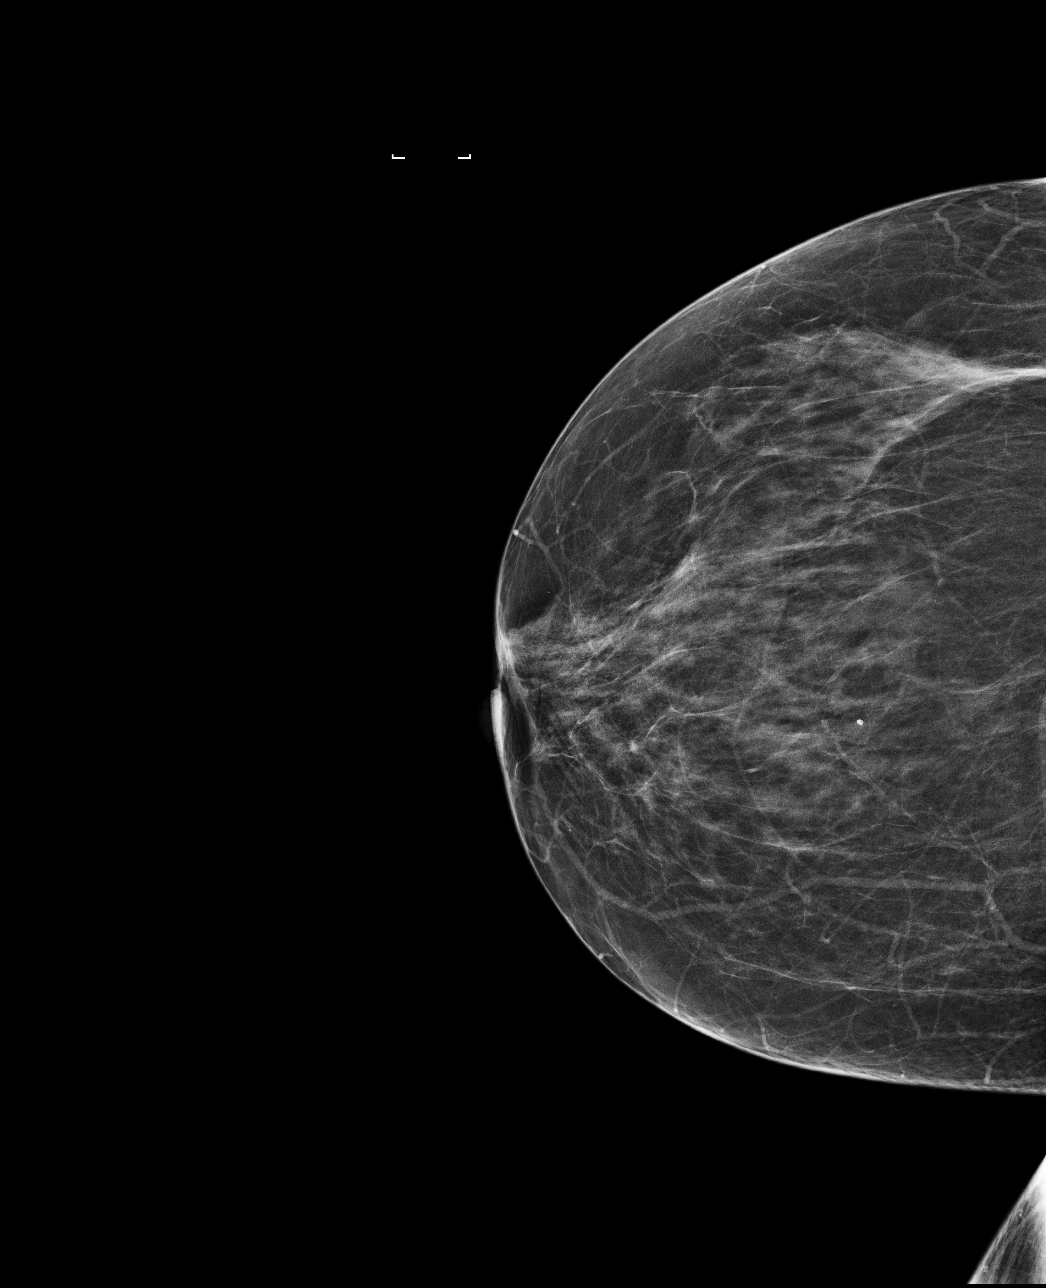

[L MLO]
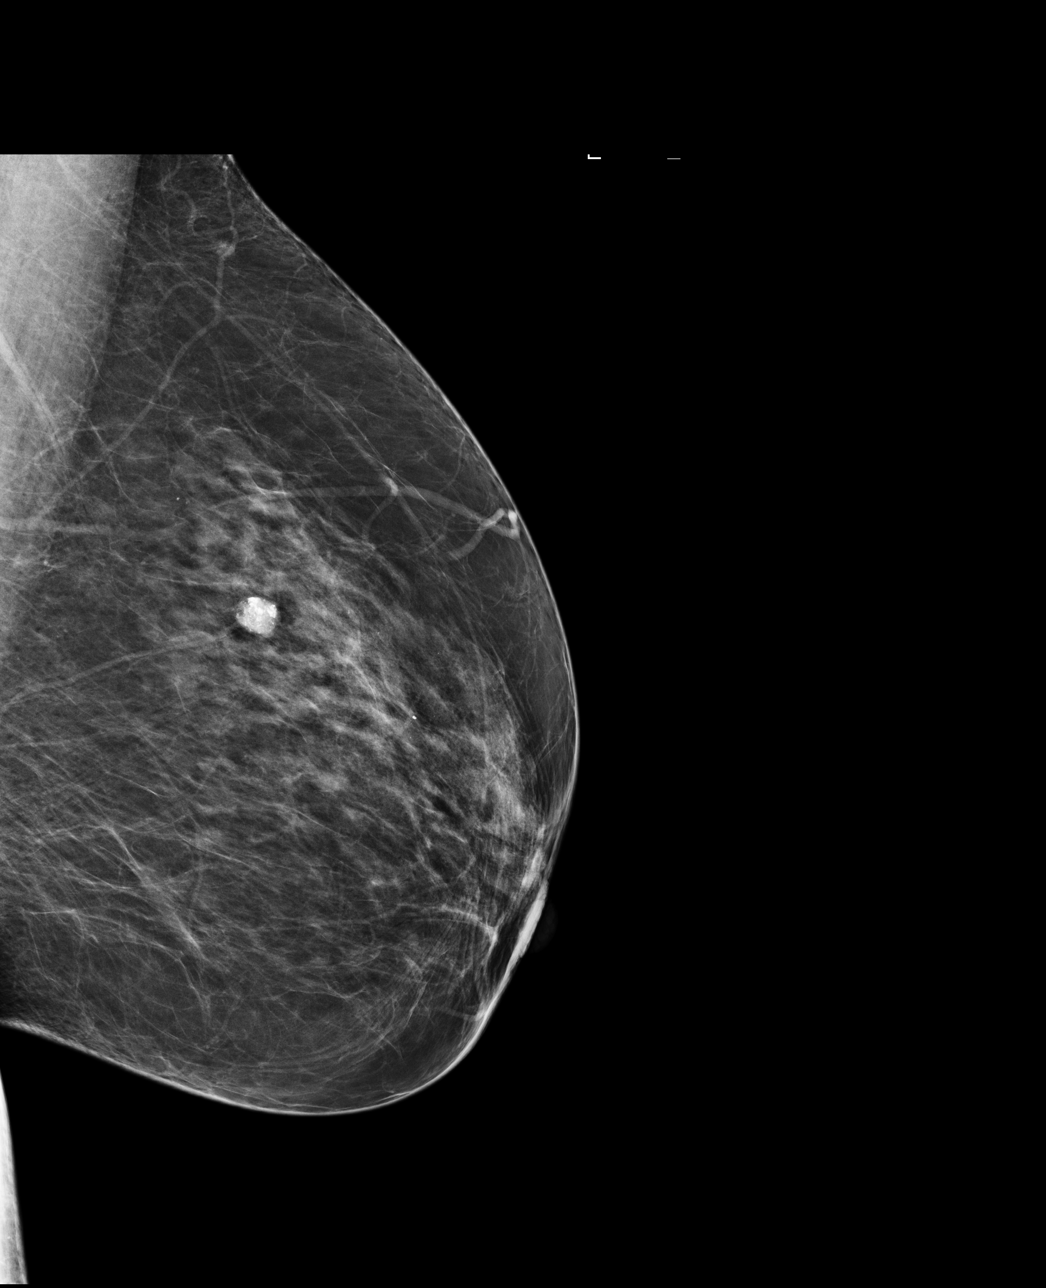

[4 of 4 positions shown; findings below may reference images not displayed]

ACR Breast Density Category b: There are scattered areas of
fibroglandular density.
FINDINGS: There are no findings suspicious for malignancy. The images were
evaluated with computer-aided detection.
IMPRESSION: No mammographic evidence of malignancy. A result letter of this
screening mammogram will be mailed directly to the patient.

RECOMMENDATION:
Screening mammogram in one year. (Code:[id number]-[id number])

BI-RADS CATEGORY  1: Negative.

## 2022-08-23 ENCOUNTER — Encounter (HOSPITAL_COMMUNITY): Payer: Self-pay | Admitting: Student in an Organized Health Care Education/Training Program

## 2022-08-23 ENCOUNTER — Ambulatory Visit (HOSPITAL_BASED_OUTPATIENT_CLINIC_OR_DEPARTMENT_OTHER): Payer: 59 | Admitting: Student in an Organized Health Care Education/Training Program

## 2022-08-23 VITALS — BP 151/100 | HR 87 | Ht 63.0 in | Wt 144.8 lb

## 2022-08-23 DIAGNOSIS — F319 Bipolar disorder, unspecified: Secondary | ICD-10-CM

## 2022-08-23 DIAGNOSIS — F411 Generalized anxiety disorder: Secondary | ICD-10-CM

## 2022-08-23 DIAGNOSIS — F4310 Post-traumatic stress disorder, unspecified: Secondary | ICD-10-CM | POA: Diagnosis not present

## 2022-08-23 MED ORDER — QUETIAPINE FUMARATE 200 MG PO TABS: 200.0000 mg | ORAL_TABLET | Freq: Every day | ORAL | 1 refills | Status: AC

## 2022-08-23 MED ORDER — SERTRALINE HCL 25 MG PO TABS: 25.0000 mg | ORAL_TABLET | Freq: Every day | ORAL | 1 refills | Status: DC

## 2022-08-23 NOTE — Progress Notes (Addendum)
BH MD/PA/NP OP Progress Note  08/23/2022 12:23 PM Patricia [name]  MRN:  [id number]  Chief Complaint:  Chief Complaint  Patient presents with   Follow-up   Bipolar Depressed   HPI:  Patricia [name] is a 52 yr old female who presents for Follow Up and Medication Management. PPHx is significant for Bipolar Disorder, Anxiety, OCD, and PTSD, and 2 Suicide Attempts (last- jump in front of car 2018), and no history of Self Injurious Behavior or Psychiatric Hospitalizations.    She reports that she has noticed improvements across the board since her last appointment.  She reports that her sleep is better and she is not waking up as often in the middle of the night.  She reports that her appetite is improving, while she still not eating full meals she is snacking more.  She reports her coworkers have told her she started joking more with them like she used to.  She reports no side effects to her Seroquel.  Discussed with her that since she had this good response to the Seroquel we could further increase it and start an antidepressant at this time since we would have a solid foundation of mood stabilization.  Discussed starting Zoloft as it is first on treatment for PTSD.  Discussed potential risks and side effects and she is agreeable to a trial.  When asked if she had obtain an EKG yet she reports she has not.  Discussed with her the importance of getting this and encouraged her to call her PCP to make an appointment for this and she reports she will.  She reports no SI, HI, or AVH.  She reports sleep is fair.  She reports her appetite is fair.  She reports no other concerns at present.  She return follow-up approximately 6 weeks.  Discussed with patient that Resident Provider would be transitioning their care to another Resident Provider, Dr. Hazle Quant, starting July 2024.   Visit Diagnosis:    ICD-10-CM   1. Bipolar 1 disorder, depressed (HCC)  F31.9 QUEtiapine (SEROQUEL) 200 MG tablet    sertraline  (ZOLOFT) 25 MG tablet    2. GAD (generalized anxiety disorder)  F41.1 QUEtiapine (SEROQUEL) 200 MG tablet    sertraline (ZOLOFT) 25 MG tablet    3. PTSD (post-traumatic stress disorder)  F43.10 QUEtiapine (SEROQUEL) 200 MG tablet    sertraline (ZOLOFT) 25 MG tablet      Past Psychiatric History: Bipolar Disorder, Anxiety, OCD, and PTSD, and 2 Suicide Attempts (last- jump in front of car 2018), and no history of Self Injurious Behavior or Psychiatric Hospitalizations.   Past Medical History:  Past Medical History:  Diagnosis Date   Abnormal perimenopausal bleeding    Anxiety    Cervical dysplasia    Depression    GERD (gastroesophageal reflux disease)    Head trauma    Pregnancy induced hypertension    Rheumatoid arthritis (HCC)    Seizure disorder (HCC)    Shingles    STD (sexually transmitted disease)    Gonorrhea    Past Surgical History:  Procedure Laterality Date   BICEPS TENDON REPAIR     CESAREAN SECTION     DILATATION & CURETTAGE/HYSTEROSCOPY WITH MYOSURE N/A 05/23/2018   Procedure: DILATATION & CURETTAGE/HYSTEROSCOPY;  Surgeon: Dara Lords, MD;  Location: [facility];  Service: Gynecology;  Laterality: N/A;   SHOULDER SURGERY     WISDOM TOOTH EXTRACTION      Family Psychiatric History: Both Sides- Multiple members EtOH Abuse  No Known Diagnosis' or Suicides.  Family History:  Family History  Problem Relation Age of Onset   Healthy Mother    Healthy Father    Esophageal cancer Paternal Uncle    Cancer Maternal Grandfather        Lung   Colon cancer Neg Hx    Colon polyps Neg Hx    Rectal cancer Neg Hx    Stomach cancer Neg Hx     Social History:  Social History   Socioeconomic History   Marital status: Single    Spouse name: Not on file   Number of children: Not on file   Years of education: Not on file   Highest education level: Not on file  Occupational History   Not on file  Tobacco Use   Smoking status: Some Days     Packs/day: 0.10    Years: 20.00    Additional pack years: 0.00    Total pack years: 2.00    Types: Cigarettes    Start date: 08/31/2016   Smokeless tobacco: Never   Tobacco comments:    quit 04-19-2018  Vaping Use   Vaping Use: Never used  Substance and Sexual Activity   Alcohol use: Yes    Comment: occasionaly   Drug use: Yes    Types: Marijuana    Comment: last use 07-03-21 at 6pm   Sexual activity: Yes    Comment: 1st intercourse 3 yrs-5 partners  Other Topics Concern   Not on file  Social History Narrative   Lives with godson, daughter and grandson      Lives in a two story home.      Right handed      Highest level of education- 12 grade      Disabled      Social Determinants of Health   Financial Resource Strain: Medium Risk (05/19/2021)   Overall Financial Resource Strain (CARDIA)    Difficulty of Paying Living Expenses: Somewhat hard  Food Insecurity: Food Insecurity Present (05/19/2021)   Hunger Vital Sign    Worried About Running Out of Food in the Last Year: Sometimes true    Ran Out of Food in the Last Year: Sometimes true  Transportation Needs: No Transportation Needs (05/19/2021)   PRAPARE - Administrator, Civil Service (Medical): No    Lack of Transportation (Non-Medical): No  Physical Activity: Insufficiently Active (05/19/2021)   Exercise Vital Sign    Days of Exercise per Week: 3 days    Minutes of Exercise per Session: 10 min  Stress: Stress Concern Present (05/19/2021)   [employer] of Occupational Health - Occupational Stress Questionnaire    Feeling of Stress : To some extent  Social Connections: Moderately Integrated (05/19/2021)   Social Connection and Isolation Panel [NHANES]    Frequency of Communication with Friends and Family: Three times a week    Frequency of Social Gatherings with Friends and Family: Three times a week    Attends Religious Services: More than 4 times per year    Active Member of Clubs or Organizations:  Yes    Attends Banker Meetings: More than 4 times per year    Marital Status: Never married    Allergies:  Allergies  Allergen Reactions   Tramadol     Metabolic Disorder Labs: Lab Results  Component Value Date   HGBA1C 6.1 04/13/2022   No results found for: "PROLACTIN" Lab Results  Component Value Date   CHOL 196 04/13/2022  TRIG 101.0 04/13/2022   HDL 46.90 04/13/2022   CHOLHDL 4 04/13/2022   VLDL 20.2 04/13/2022   LDLCALC 129 (H) 04/13/2022   LDLCALC 112 (H) 07/26/2020   Lab Results  Component Value Date   TSH 0.36 04/13/2022   TSH 1.68 03/10/2018    Therapeutic Level Labs: No results found for: "LITHIUM" No results found for: "VALPROATE" No results found for: "CBMZ"  Current Medications: Current Outpatient Medications  Medication Sig Dispense Refill   sertraline (ZOLOFT) 25 MG tablet Take 1 tablet (25 mg total) by mouth daily. 30 tablet 1   amLODipine-benazepril (LOTREL) 5-20 MG capsule TAKE 1 CAPSULE BY MOUTH DAILY 90 capsule 1   gabapentin (NEURONTIN) 300 MG capsule TAKE 1 CAPSULE(300 MG) BY MOUTH THREE TIMES DAILY 90 capsule 1   hydrochlorothiazide (HYDRODIURIL) 25 MG tablet TAKE 1 TABLET(25 MG) BY MOUTH DAILY 90 tablet 2   methocarbamol (ROBAXIN) 500 MG tablet TAKE 1 TABLET(500 MG) BY MOUTH TWICE DAILY AS NEEDED FOR MUSCLE SPASMS 60 tablet 0   Multiple Vitamins-Minerals (MULTIVITAMIN WITH MINERALS) tablet Take 1 tablet by mouth daily.     omeprazole (PRILOSEC) 20 MG capsule Take 1 capsule (20 mg total) by mouth daily. 90 capsule 2   QUEtiapine (SEROQUEL) 200 MG tablet Take 1 tablet (200 mg total) by mouth at bedtime. 30 tablet 1   No current facility-administered medications for this visit.     Musculoskeletal: Strength & Muscle Tone: within normal limits Gait & Station: normal Patient leans: N/A  Psychiatric Specialty Exam: Review of Systems  Respiratory:  Negative for shortness of breath.   Cardiovascular:  Negative for chest  pain.  Gastrointestinal:  Negative for abdominal pain, constipation, diarrhea, nausea and vomiting.  Neurological:  Negative for dizziness, weakness and headaches.  Psychiatric/Behavioral:  Positive for dysphoric mood. Negative for hallucinations, sleep disturbance and suicidal ideas. The patient is not nervous/anxious.     Blood pressure (!) 151/100, pulse 87, height 5\' 3"  (1.6 m), weight 144 lb 12.8 oz (65.7 kg), last menstrual period 07/30/2016.Body mass index is 25.65 kg/m.  General Appearance: Casual and Fairly Groomed  Eye Contact:  Good  Speech:  Clear and Coherent and Normal Rate  Volume:  Normal  Mood:   "better"  Affect:  Appropriate and Congruent  Thought Process:  Coherent and Goal Directed  Orientation:  Full (Time, Place, and Person)  Thought Content: WDL and Logical   Suicidal Thoughts:  No  Homicidal Thoughts:  No  Memory:  Immediate;   Good Recent;   Good  Judgement:  Good  Insight:  Good  Psychomotor Activity:  Normal  Concentration:  Concentration: Good and Attention Span: Good  Recall:  Good  Fund of Knowledge: Good  Language: Good  Akathisia:  Negative  Handed:  Right  AIMS (if indicated): not done  Assets:  Communication Skills Desire for Improvement Financial Resources/Insurance Housing Physical Health Resilience Social Support  ADL's:  Intact  Cognition: WNL  Sleep:  Fair   Screenings: PHQ2-9    Flowsheet Row Video Visit from 05/22/2022 in [facility] [place] Office Visit from 04/13/2022 in [facility] [place] Clinical Support from 05/19/2021 in [facility] [place] Office Visit from 05/09/2021 in [facility] [place] Clinical Support from 04/13/2020 in [facility] [place]  PHQ-2 Total Score 4 3 0 3 0  PHQ-9 Total Score 15 12 1 14  0      Flowsheet Row ED from 12/02/2021  in [facility])   C-SSRS RISK CATEGORY No Risk        Assessment and Plan:  Patricia [name] is a 52 yr old female who presents for Follow Up and Medication Management. PPHx is significant for Bipolar Disorder, Anxiety, OCD, and PTSD, and 2 Suicide Attempts (last- jump in front of car 2018), and no history of Self Injurious Behavior or Psychiatric Hospitalizations.    Patricia [name] had a positive response to the Seroquel across the board.  At this point we will further increase the Seroquel to ensure mood stability.  We will also start Zoloft for her depression, anxiety, and PTSD.  We will not make any other changes to her medication at this time.  She will return for follow-up in approximately 6 weeks.    Discussed her elevated blood pressure with her.  She reports she is having chronic pain and she did not take her medication this morning nor did she take her blood pressure medicine which is why it is elevated but she reports she will take it as if she gets home.  Encouraged her to consistently take her medication.  Discussed with her the need to get an EKG and she reports she will contact her PCP to schedule this.   Bipolar Disorder, Current Episode Depressed  GAD  PTSD: -Increase Seroquel to 200 mg QHS for mood stability and augmentation.  30 tablets with 1 refill. -Start Zoloft 25 mg daily for depression and anxiety.  30 tablets with 1 refill.    Collaboration of Care:   Patient/Guardian was advised Release of Information must be obtained prior to any record release in order to collaborate their care with an outside provider. Patient/Guardian was advised if they have not already done so to contact the registration department to sign all necessary forms in order for [place] to release information regarding their care.   Consent: Patient/Guardian gives verbal consent for treatment and assignment of benefits for services provided during this visit. Patient/Guardian expressed understanding and agreed to proceed.     Lauro Franklin, MD 08/23/2022, 12:23 PM

## 2022-08-31 ENCOUNTER — Ambulatory Visit
Admission: RE | Admit: 2022-08-31 | Discharge: 2022-08-31 | Disposition: A | Discharge status: Home / Self Care | Payer: 59 | Source: Ambulatory Visit | Attending: Family Medicine | Admitting: Family Medicine

## 2022-08-31 DIAGNOSIS — Z1231 Encounter for screening mammogram for malignant neoplasm of breast: Secondary | ICD-10-CM

## 2022-09-10 ENCOUNTER — Telehealth (HOSPITAL_COMMUNITY): Payer: Self-pay

## 2022-09-10 NOTE — Telephone Encounter (Signed)
Patient is calling to report that the Quetiapine is giving her real bad headaches and her eyes hurt. She also has a lot of gas. I advised patient that these are not typical side affects of Quetiapine and she said that she read them in the paper and she knows it is from that. Please review and advise, thank you

## 2022-09-13 ENCOUNTER — Other Ambulatory Visit: Payer: Self-pay | Admitting: Family Medicine

## 2022-09-13 DIAGNOSIS — M542 Cervicalgia: Secondary | ICD-10-CM

## 2022-09-13 DIAGNOSIS — I10 Essential (primary) hypertension: Secondary | ICD-10-CM

## 2022-09-13 DIAGNOSIS — M792 Neuralgia and neuritis, unspecified: Secondary | ICD-10-CM

## 2022-09-14 DIAGNOSIS — M47816 Spondylosis without myelopathy or radiculopathy, lumbar region: Secondary | ICD-10-CM | POA: Diagnosis not present

## 2022-09-14 DIAGNOSIS — M47812 Spondylosis without myelopathy or radiculopathy, cervical region: Secondary | ICD-10-CM | POA: Diagnosis not present

## 2022-09-24 DIAGNOSIS — M47816 Spondylosis without myelopathy or radiculopathy, lumbar region: Secondary | ICD-10-CM | POA: Diagnosis not present

## 2022-09-25 ENCOUNTER — Telehealth: Payer: Self-pay | Admitting: Family Medicine

## 2022-09-25 MED ORDER — OMEPRAZOLE 20 MG PO CPDR: 20.0000 mg | DELAYED_RELEASE_CAPSULE | Freq: Every day | ORAL | 2 refills | Status: DC

## 2022-09-25 NOTE — Telephone Encounter (Signed)
Rx sent 

## 2022-09-25 NOTE — Telephone Encounter (Signed)
Prescription Request  09/25/2022  LOV: 04/13/2022  What is the name of the medication or equipment? omeprazole (PRILOSEC) 20 MG capsule  Have you contacted your pharmacy to request a refill? No   Which pharmacy would you like this sent to?  [id number] - [place], [address] - [address] AT [address] [place] [address] Phone: [phone] Fax: [phone]     Patient notified that their request is being sent to the clinical staff for review and that they should receive a response within 2 business days.   Please advise at Mobile [phone] (mobile)

## 2022-09-28 DIAGNOSIS — M5134 Other intervertebral disc degeneration, thoracic region: Secondary | ICD-10-CM | POA: Diagnosis not present

## 2022-09-28 DIAGNOSIS — M47816 Spondylosis without myelopathy or radiculopathy, lumbar region: Secondary | ICD-10-CM | POA: Diagnosis not present

## 2022-10-08 ENCOUNTER — Ambulatory Visit (HOSPITAL_BASED_OUTPATIENT_CLINIC_OR_DEPARTMENT_OTHER): Payer: 59 | Admitting: Student

## 2022-10-08 ENCOUNTER — Telehealth (HOSPITAL_COMMUNITY): Payer: Self-pay | Admitting: Student

## 2022-10-08 VITALS — BP 130/89 | HR 72 | Ht 62.0 in | Wt 145.0 lb

## 2022-10-08 DIAGNOSIS — F1090 Alcohol use, unspecified, uncomplicated: Secondary | ICD-10-CM

## 2022-10-08 DIAGNOSIS — F411 Generalized anxiety disorder: Secondary | ICD-10-CM | POA: Diagnosis not present

## 2022-10-08 DIAGNOSIS — F4310 Post-traumatic stress disorder, unspecified: Secondary | ICD-10-CM

## 2022-10-08 DIAGNOSIS — F319 Bipolar disorder, unspecified: Secondary | ICD-10-CM

## 2022-10-08 MED ORDER — SERTRALINE HCL 25 MG PO TABS
25.00 mg | ORAL_TABLET | Freq: Every day | ORAL | 1 refills | Status: AC
Start: 2022-10-08 — End: ?

## 2022-10-08 NOTE — Patient Instructions (Signed)
Thank you for attending your appointment today.   Please do not make any changes to medications without first discussing with your provider. If you are experiencing a psychiatric emergency, please call 911 or present to your nearest emergency department. Additional crisis, medication management, and therapy resources are included below.  [facility]  [address], [place], [place] [address] [phone] or [phone] WALK-IN URGENT CARE 24/7 FOR ANYONE [address], , [place]  [phone] Fax: [phone] [email or web address] *Interpreters available *Accepts all insurance and uninsured for Urgent Care needs     
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
## 2022-10-22 ENCOUNTER — Ambulatory Visit (HOSPITAL_COMMUNITY): Payer: 59 | Admitting: Student

## 2022-10-29 ENCOUNTER — Ambulatory Visit (HOSPITAL_COMMUNITY): Payer: 59 | Admitting: Student

## 2022-11-12 ENCOUNTER — Ambulatory Visit (HOSPITAL_COMMUNITY): Payer: 59 | Admitting: Student

## 2022-11-13 ENCOUNTER — Telehealth (HOSPITAL_COMMUNITY): Payer: Self-pay

## 2022-11-13 NOTE — Telephone Encounter (Signed)
Patients pharmacy sent a fax requesting a refill of Seroquel. It has not been ordered since May and was not reordered at your last visit with her. Please review and advise, thank you

## 2022-11-19 ENCOUNTER — Ambulatory Visit (HOSPITAL_COMMUNITY): Payer: 59 | Admitting: Student

## 2023-02-08 ENCOUNTER — Other Ambulatory Visit: Payer: Self-pay

## 2023-02-08 DIAGNOSIS — I10 Essential (primary) hypertension: Secondary | ICD-10-CM

## 2023-02-08 DIAGNOSIS — M792 Neuralgia and neuritis, unspecified: Secondary | ICD-10-CM

## 2023-02-08 DIAGNOSIS — M542 Cervicalgia: Secondary | ICD-10-CM

## 2023-02-08 MED ORDER — AMLODIPINE BESY-BENAZEPRIL HCL 5-20 MG PO CAPS
1.0000 | ORAL_CAPSULE | Freq: Every day | ORAL | 1 refills | Status: DC
Start: 2023-02-08 — End: 2023-03-25

## 2023-02-08 MED ORDER — OMEPRAZOLE 20 MG PO CPDR: 20.0000 mg | DELAYED_RELEASE_CAPSULE | Freq: Every day | ORAL | 1 refills | Status: DC

## 2023-02-08 MED ORDER — GABAPENTIN 300 MG PO CAPS
300.0000 mg | ORAL_CAPSULE | Freq: Three times a day (TID) | ORAL | 5 refills | Status: DC
Start: 2023-02-08 — End: 2023-03-22

## 2023-03-20 ENCOUNTER — Other Ambulatory Visit: Payer: Self-pay | Admitting: Family Medicine

## 2023-03-20 DIAGNOSIS — M792 Neuralgia and neuritis, unspecified: Secondary | ICD-10-CM

## 2023-03-20 DIAGNOSIS — M542 Cervicalgia: Secondary | ICD-10-CM

## 2023-03-25 ENCOUNTER — Other Ambulatory Visit: Payer: Self-pay | Admitting: Family Medicine

## 2023-03-25 DIAGNOSIS — I10 Essential (primary) hypertension: Secondary | ICD-10-CM

## 2023-04-17 ENCOUNTER — Other Ambulatory Visit: Payer: Self-pay | Admitting: Family Medicine

## 2023-04-17 DIAGNOSIS — I10 Essential (primary) hypertension: Secondary | ICD-10-CM

## 2023-05-15 ENCOUNTER — Other Ambulatory Visit: Payer: Self-pay | Admitting: Family Medicine

## 2023-06-13 ENCOUNTER — Other Ambulatory Visit: Payer: Self-pay | Admitting: Family Medicine

## 2023-06-23 ENCOUNTER — Other Ambulatory Visit: Payer: Self-pay | Admitting: Family Medicine

## 2023-06-23 DIAGNOSIS — M792 Neuralgia and neuritis, unspecified: Secondary | ICD-10-CM

## 2023-06-23 DIAGNOSIS — M542 Cervicalgia: Secondary | ICD-10-CM

## 2023-07-02 ENCOUNTER — Encounter: Payer: Self-pay | Admitting: Family Medicine

## 2023-07-02 ENCOUNTER — Ambulatory Visit (INDEPENDENT_AMBULATORY_CARE_PROVIDER_SITE_OTHER): Admitting: Family Medicine

## 2023-07-02 VITALS — BP 128/85 | HR 67 | Temp 98.4°F | Ht 62.0 in | Wt 144.8 lb

## 2023-07-02 DIAGNOSIS — I10 Essential (primary) hypertension: Secondary | ICD-10-CM | POA: Diagnosis not present

## 2023-07-02 DIAGNOSIS — Z0000 Encounter for general adult medical examination without abnormal findings: Secondary | ICD-10-CM | POA: Diagnosis not present

## 2023-07-02 DIAGNOSIS — E785 Hyperlipidemia, unspecified: Secondary | ICD-10-CM | POA: Diagnosis not present

## 2023-07-02 DIAGNOSIS — F319 Bipolar disorder, unspecified: Secondary | ICD-10-CM

## 2023-07-02 DIAGNOSIS — R7303 Prediabetes: Secondary | ICD-10-CM

## 2023-07-02 LAB — LIPID PANEL
Cholesterol: 194 mg/dL (ref 0–200)
HDL: 50.5 mg/dL
LDL Cholesterol: 129 mg/dL — ABNORMAL HIGH (ref 0–99)
NonHDL: 143.76
Total CHOL/HDL Ratio: 4
Triglycerides: 74 mg/dL (ref 0.0–149.0)
VLDL: 14.8 mg/dL (ref 0.0–40.0)

## 2023-07-02 LAB — COMPREHENSIVE METABOLIC PANEL WITH GFR
ALT: 12 U/L (ref 0–35)
AST: 16 U/L (ref 0–37)
Albumin: 4.3 g/dL (ref 3.5–5.2)
Alkaline Phosphatase: 111 U/L (ref 39–117)
BUN: 11 mg/dL (ref 6–23)
CO2: 28 meq/L (ref 19–32)
Calcium: 9.5 mg/dL (ref 8.4–10.5)
Chloride: 102 meq/L (ref 96–112)
Creatinine, Ser: 0.68 mg/dL (ref 0.40–1.20)
GFR: 99.01 mL/min
Glucose, Bld: 116 mg/dL — ABNORMAL HIGH (ref 70–99)
Potassium: 3.3 meq/L — ABNORMAL LOW (ref 3.5–5.1)
Sodium: 138 meq/L (ref 135–145)
Total Bilirubin: 0.7 mg/dL (ref 0.2–1.2)
Total Protein: 7.4 g/dL (ref 6.0–8.3)

## 2023-07-02 LAB — HEMOGLOBIN A1C: Hgb A1c MFr Bld: 6 % (ref 4.6–6.5)

## 2023-07-02 MED ORDER — AMLODIPINE BESY-BENAZEPRIL HCL 5-40 MG PO CAPS
1.0000 | ORAL_CAPSULE | Freq: Every day | ORAL | 1 refills | Status: AC
Start: 2023-07-02 — End: ?

## 2023-07-02 NOTE — Patient Instructions (Addendum)
 A few things to remember from today's visit:  Routine general medical examination at a health care facility  Hypertension, essential, benign - Plan: Comprehensive metabolic panel with GFR  Prediabetes - Plan: Hemoglobin A1c  Hyperlipidemia, unspecified hyperlipidemia type - Plan: Lipid panel  Bipolar 1 disorder, depressed (HCC), Chronic  Today blood pressure medication changed. Let me know about blood pressure readings in 2 weeks.  If you need refills for medications you take chronically, please call your pharmacy. Do not use My Chart to request refills or for acute issues that need immediate attention. If you send a my chart message, it may take a few days to be addressed, specially if I am not in the office.  Please be sure medication list is accurate. If a new problem present, please set up appointment sooner than planned today.  Health Maintenance, Female Adopting a healthy lifestyle and getting preventive care are important in promoting health and wellness. Ask your health care provider about: The right schedule for you to have regular tests and exams. Things you can do on your own to prevent diseases and keep yourself healthy. What should I know about diet, weight, and exercise? Eat a healthy diet  Eat a diet that includes plenty of vegetables, fruits, low-fat dairy products, and lean protein. Do not eat a lot of foods that are high in solid fats, added sugars, or sodium. Maintain a healthy weight Body mass index (BMI) is used to identify weight problems. It estimates body fat based on height and weight. Your health care provider can help determine your BMI and help you achieve or maintain a healthy weight. Get regular exercise Get regular exercise. This is one of the most important things you can do for your health. Most adults should: Exercise for at least 150 minutes each week. The exercise should increase your heart rate and make you sweat (moderate-intensity  exercise). Do strengthening exercises at least twice a week. This is in addition to the moderate-intensity exercise. Spend less time sitting. Even light physical activity can be beneficial. Watch cholesterol and blood lipids Have your blood tested for lipids and cholesterol at 54 years of age, then have this test every 5 years. Have your cholesterol levels checked more often if: Your lipid or cholesterol levels are high. You are older than 54 years of age. You are at high risk for heart disease. What should I know about cancer screening? Depending on your health history and family history, you may need to have cancer screening at various ages. This may include screening for: Breast cancer. Cervical cancer. Colorectal cancer. Skin cancer. Lung cancer. What should I know about heart disease, diabetes, and high blood pressure? Blood pressure and heart disease High blood pressure causes heart disease and increases the risk of stroke. This is more likely to develop in people who have high blood pressure readings or are overweight. Have your blood pressure checked: Every 3-5 years if you are 69-42 years of age. Every year if you are 57 years old or older. Diabetes Have regular diabetes screenings. This checks your fasting blood sugar level. Have the screening done: Once every three years after age 60 if you are at a normal weight and have a low risk for diabetes. More often and at a younger age if you are overweight or have a high risk for diabetes. What should I know about preventing infection? Hepatitis B If you have a higher risk for hepatitis B, you should be screened for this virus. Talk with  your health care provider to find out if you are at risk for hepatitis B infection. Hepatitis C Testing is recommended for: Everyone born from [birth year] through 1966. Anyone with known risk factors for hepatitis C. Sexually transmitted infections (STIs) Get screened for STIs, including gonorrhea and  chlamydia, if: You are sexually active and are younger than 54 years of age. You are older than 55 years of age and your health care provider tells you that you are at risk for this type of infection. Your sexual activity has changed since you were last screened, and you are at increased risk for chlamydia or gonorrhea. Ask your health care provider if you are at risk. Ask your health care provider about whether you are at high risk for HIV. Your health care provider may recommend a prescription medicine to help prevent HIV infection. If you choose to take medicine to prevent HIV, you should first get tested for HIV. You should then be tested every 3 months for as long as you are taking the medicine. Pregnancy If you are about to stop having your period (premenopausal) and you may become pregnant, seek counseling before you get pregnant. Take 400 to 800 micrograms (mcg) of folic acid every day if you become pregnant. Ask for birth control (contraception) if you want to prevent pregnancy. Osteoporosis and menopause Osteoporosis is a disease in which the bones lose minerals and strength with aging. This can result in bone fractures. If you are 69 years old or older, or if you are at risk for osteoporosis and fractures, ask your health care provider if you should: Be screened for bone loss. Take a calcium or vitamin D supplement to lower your risk of fractures. Be given hormone replacement therapy (HRT) to treat symptoms of menopause. Follow these instructions at home: Alcohol use Do not drink alcohol if: Your health care provider tells you not to drink. You are pregnant, may be pregnant, or are planning to become pregnant. If you drink alcohol: Limit how much you have to: 0-1 drink a day. Know how much alcohol is in your drink. In the [place] one drink equals one 12 oz bottle of beer (355 mL), one 5 oz glass of wine (148 mL), or one 1 oz glass of hard liquor (44 mL). Lifestyle Do not use any  products that contain nicotine or tobacco. These products include cigarettes, chewing tobacco, and vaping devices, such as e-cigarettes. If you need help quitting, ask your health care provider. Do not use street drugs. Do not share needles. Ask your health care provider for help if you need support or information about quitting drugs. General instructions Schedule regular health, dental, and eye exams. Stay current with your vaccines. Tell your health care provider if: You often feel depressed. You have ever been abused or do not feel safe at home. Summary Adopting a healthy lifestyle and getting preventive care are important in promoting health and wellness. Follow your health care provider's instructions about healthy diet, exercising, and getting tested or screened for diseases. Follow your health care provider's instructions on monitoring your cholesterol and blood pressure. This information is not intended to replace advice given to you by your health care provider. Make sure you discuss any questions you have with your health care provider. Document Revised: 08/08/2020 Document Reviewed: 08/08/2020 Elsevier Patient Education  2024 [employer].

## 2023-07-02 NOTE — Progress Notes (Signed)
 HPI: Patricia [name] is a 53 y.o. female with a PMHx significant for HTN, chronic pain, bipolar I, tobacco use disorder, prediabetes, and HLD, who is here today for her routine physical.  Last CPE: 04/13/2022.   Exercise: Patient states she goes to the gym to jog and walk, and also does range of motion exercises for her neck and arms daily.  Diet: She eats out for most of her meals. She eats a lot of fruit.  Sleep: ~4 hours per night.   Alcohol Use: She goes through a bottle of wine every months.  Smoking: She smokes marijuana but has not smoked cigarettes for 6-7 months. She first started smoking cigarettes at around the age of 40 and smoked about 3 cigarettes per day.  Vision: UTD on routine vision care.  Dental: UTD on routine dental care.  She follows regularly with a gynecologist. She has a hx of a hysterectomy.   Immunization History  Administered Date(s) Administered   Influenza,inj,Quad PF,6+ Mos 03/10/2018, 12/13/2020   Influenza-Unspecified 01/17/2017   PFIZER(Purple Top)SARS-COV-2 Vaccination 07/04/2020   Tdap 08/09/2020   Health Maintenance  Topic Date Due   Zoster Vaccines- Shingrix (1 of 2) Never done   Cervical Cancer Screening (HPV/Pap Cotest)  03/19/2023   Medicare Annual Wellness (AWV)  05/23/2023   COVID-19 Vaccine (2 - 2024-25 season) 07/18/2023 (Originally 12/02/2022)   HIV Screening  07/27/2023 (Originally 06/21/1985)   INFLUENZA VACCINE  11/01/2023   MAMMOGRAM  08/30/2024   DTaP/Tdap/Td (2 - Td or Tdap) 08/10/2030   Colonoscopy  07/05/2031   Hepatitis C Screening  Completed   HPV VACCINES  Aged Out   Chronic medical problems:   Hypertension:  Medications: Currently on amlodipine-benazepril 5-20 mg daily. She forgets to take her medication once in awhile.  BP readings at home: She checks her BP occasionally at home. The last time she checked it was 129/100.  Side effects: none Negative for unusual or severe headache, visual changes, exertional chest  pain, dyspnea,  focal weakness, or edema.  Lab Results  Component Value Date   CREATININE 0.62 04/13/2022   BUN 12 04/13/2022   NA 140 04/13/2022   K 3.6 04/13/2022   CL 103 04/13/2022   CO2 28 04/13/2022   Prediabetes:  Lab Results  Component Value Date   HGBA1C 6.1 04/13/2022   Chronic pain:  Currently on methocarbamol 500 mg daily and gabapentin 300 mg daily.  She sees pain management once per month. She has received several lower back injections, but they have not helped.   Bipolar I disorder:  She sees her psychiatrist every 3 months.  Currently on Quetiapine 200 mg at bedtime and sertraline 25 mg daily.   No new concerns or problems today.   Review of Systems  Current Outpatient Medications on File Prior to Visit  Medication Sig Dispense Refill   amLODipine-benazepril (LOTREL) 5-20 MG capsule Take 1 capsule by mouth daily. Due for follow up/physical 100 capsule 0   gabapentin (NEURONTIN) 300 MG capsule TAKE 1 CAPSULE BY MOUTH 3 TIMES  DAILY 90 capsule 3   hydrochlorothiazide (HYDRODIURIL) 25 MG tablet TAKE 1 TABLET(25 MG) BY MOUTH DAILY 90 tablet 2   methocarbamol (ROBAXIN) 500 MG tablet TAKE 1 TABLET(500 MG) BY MOUTH TWICE DAILY AS NEEDED FOR MUSCLE SPASMS 60 tablet 0   Multiple Vitamins-Minerals (MULTIVITAMIN WITH MINERALS) tablet Take 1 tablet by mouth daily.     omeprazole (PRILOSEC) 20 MG capsule Take 1 capsule (20 mg total) by mouth daily.  Due for physical 30 capsule 0   QUEtiapine (SEROQUEL) 200 MG tablet Take 1 tablet (200 mg total) by mouth at bedtime. 30 tablet 1   sertraline (ZOLOFT) 25 MG tablet Take 1 tablet (25 mg total) by mouth daily. 30 tablet 1   No current facility-administered medications on file prior to visit.   Past Medical History:  Diagnosis Date   Abnormal perimenopausal bleeding    Anxiety    Cervical dysplasia    Depression    GERD (gastroesophageal reflux disease)    Head trauma    Pregnancy induced hypertension    Rheumatoid  arthritis (HCC)    Seizure disorder (HCC)    Shingles    STD (sexually transmitted disease)    Gonorrhea   Past Surgical History:  Procedure Laterality Date   BICEPS TENDON REPAIR     CESAREAN SECTION     DILATATION & CURETTAGE/HYSTEROSCOPY WITH MYOSURE N/A 05/23/2018   Procedure: DILATATION & CURETTAGE/HYSTEROSCOPY;  Surgeon: Dara Lords, MD;  Location: [facility];  Service: Gynecology;  Laterality: N/A;   SHOULDER SURGERY     WISDOM TOOTH EXTRACTION     Allergies  Allergen Reactions   Tramadol    Family History  Problem Relation Age of Onset   Healthy Mother    Healthy Father    Esophageal cancer Paternal Uncle    Cancer Maternal Grandfather        Lung   Colon cancer Neg Hx    Colon polyps Neg Hx    Rectal cancer Neg Hx    Stomach cancer Neg Hx    Social History   Socioeconomic History   Marital status: Single    Spouse name: Not on file   Number of children: Not on file   Years of education: Not on file   Highest education level: Not on file  Occupational History   Not on file  Tobacco Use   Smoking status: Some Days    Current packs/day: 0.10    Average packs/day: 0.1 packs/day for 20.0 years (2.0 ttl pk-yrs)    Types: Cigarettes    Start date: 08/31/2016   Smokeless tobacco: Never   Tobacco comments:    quit 04-19-2018  Vaping Use   Vaping status: Never Used  Substance and Sexual Activity   Alcohol use: Yes    Comment: occasionaly   Drug use: Yes    Types: Marijuana    Comment: last use 07-03-21 at 6pm   Sexual activity: Yes    Comment: 1st intercourse 74 yrs-5 partners  Other Topics Concern   Not on file  Social History Narrative   Lives with godson, daughter and grandson      Lives in a two story home.      Right handed      Highest level of education- 12 grade      Disabled      Social Drivers of Health   Financial Resource Strain: Medium Risk (07/20/2022)   Received from [facility], [facility]   Overall  Financial Resource Strain (CARDIA)    Difficulty of Paying Living Expenses: Somewhat hard  Food Insecurity: No Food Insecurity (07/20/2022)   Received from [facility], [facility]   Hunger Vital Sign    Worried About Running Out of Food in the Last Year: Never true    Ran Out of Food in the Last Year: Never true  Transportation Needs: No Transportation Needs (07/20/2022)   [facility], [facility]  PRAPARE - Administrator, Civil Service (Medical): No    Lack of Transportation (Non-Medical): No  Physical Activity: Insufficiently Active (05/19/2021)   Exercise Vital Sign    Days of Exercise per Week: 3 days    Minutes of Exercise per Session: 10 min  Stress: Stress Concern Present (05/19/2021)   [employer] of Occupational Health - Occupational Stress Questionnaire    Feeling of Stress : To some extent  Social Connections: Unknown (08/15/2021)   Received from [name], [facility]   Social Network    Social Network: Not on file   There were no vitals filed for this visit. There is no height or weight on file to calculate BMI.  Wt Readings from Last 3 Encounters:  04/13/22 150 lb 4 oz (68.2 kg)  07/04/21 155 lb (70.3 kg)  06/13/21 155 lb (70.3 kg)   Physical Exam Vitals and nursing note reviewed.  Constitutional:      General: She is not in acute distress.    Appearance: She is well-developed.  HENT:     Head: Normocephalic and atraumatic.     Right Ear: Hearing, tympanic membrane, ear canal and external ear normal.     Left Ear: Hearing, tympanic membrane, ear canal and external ear normal.     Mouth/Throat:     Mouth: Mucous membranes are moist.     Pharynx: Oropharynx is clear. Uvula midline.  Eyes:     Extraocular Movements: Extraocular movements intact.     Conjunctiva/sclera: Conjunctivae normal.     Pupils: Pupils are equal, round, and reactive to light.  Neck:     Thyroid: No thyroid mass or thyromegaly.     Trachea:  No tracheal deviation.  Cardiovascular:     Rate and Rhythm: Normal rate and regular rhythm.     Pulses:          Dorsalis pedis pulses are 2+ on the right side and 2+ on the left side.     Heart sounds: No murmur heard. Pulmonary:     Effort: Pulmonary effort is normal. No respiratory distress.     Breath sounds: Normal breath sounds.  Abdominal:     Palpations: Abdomen is soft. There is no hepatomegaly or mass.     Tenderness: There is no abdominal tenderness.  Genitourinary:    Comments: Deferred to gyn. Musculoskeletal:     Right lower leg: No edema.     Left lower leg: No edema.     Comments: No major deformity or signs of synovitis appreciated.  Lymphadenopathy:     Cervical: No cervical adenopathy.     Upper Body:     Right upper body: No supraclavicular adenopathy.     Left upper body: No supraclavicular adenopathy.  Skin:    General: Skin is warm.     Findings: No erythema or rash.  Neurological:     General: No focal deficit present.     Mental Status: She is alert and oriented to person, place, and time.     Cranial Nerves: No cranial nerve deficit.     Coordination: Coordination normal.     Gait: Gait normal.     Deep Tendon Reflexes:     Reflex Scores:      Bicep reflexes are 2+ on the right side and 2+ on the left side.      Patellar reflexes are 2+ on the right side and 2+ on the left side. Psychiatric:     Comments: Well  groomed, good eye contact.    ASSESSMENT AND PLAN:  Patricia [name] was here today for her annual physical examination.  No orders of the defined types were placed in this encounter.  @ASSESSPLAN @  There are no diagnoses linked to this encounter.  No follow-ups on file.  I, Rolla Etienne Wierda, acting as a scribe for Nollie Shiflett [place], MD., have documented all relevant documentation on the behalf of Teddy Pena [place], MD, as directed by  Onesty Clair [place], MD while in the presence of Alexius Ellington [place], MD.   I, Adeli Frost [place], MD, have reviewed all  documentation for this visit. The documentation on 07/02/23 for the exam, diagnosis, procedures, and orders are all accurate and complete.  Jamyra Zweig G. [place], MD  [facility]. [place] office.

## 2023-07-03 ENCOUNTER — Other Ambulatory Visit: Payer: Self-pay | Admitting: Family Medicine

## 2023-07-03 DIAGNOSIS — M542 Cervicalgia: Secondary | ICD-10-CM

## 2023-07-03 NOTE — Telephone Encounter (Signed)
 Copied from CRM [id number]. Topic: Clinical - Medication Refill >> Jul 03, 2023  3:49 PM Fonda Kinder J wrote: Most Recent Primary Care Visit:  Provider: Terressa Koyanagi  Department: [facility]  Visit Type: MYCHART VIDEO VISIT  Date: 05/22/2022  Medication: methocarbamol (ROBAXIN) 500 MG tablet  Has the patient contacted their pharmacy? Yes (Agent: If no, request that the patient contact the pharmacy for the refill. If patient does not wish to contact the pharmacy document the reason why and proceed with request.) (Agent: If yes, when and what did the pharmacy advise?) Contact PCP  Is this the correct pharmacy for this prescription? Yes If no, delete pharmacy and type the correct one.  This is the patient's preferred pharmacy:  [facility] [address] [place] - [address] [place] [address] Phone: [phone] Fax: [phone]    Has the prescription been filled recently? No  Is the patient out of the medication? Yes  Has the patient been seen for an appointment in the last year OR does the patient have an upcoming appointment? Yes  Can we respond through MyChart? Yes  Agent: Please be advised that Rx refills may take up to 3 business days. We ask that you follow-up with your pharmacy.

## 2023-07-04 ENCOUNTER — Encounter: Payer: Self-pay | Admitting: Family Medicine

## 2023-07-04 NOTE — Assessment & Plan Note (Signed)
 Non pharmacologic treatment recommended for now. Further recommendations will be given according to 10 years CVD risk score and lipid panel numbers.

## 2023-07-04 NOTE — Assessment & Plan Note (Signed)
 BP has been elevated at home and elevated today. Benazepril increased from 20 mg to 40 mg, no changes in Amlodipine dose. Continue monitoring BP's at home, goal < 130/80. BP readings in 2 weeks. Continue low salt diet. Eye exam is current.

## 2023-07-04 NOTE — Assessment & Plan Note (Addendum)
 We discussed the importance of regular physical activity and healthy diet for prevention of chronic illness and/or complications. Preventive guidelines reviewed. Vaccination: Prevnar 20 given today. Following with gynecologist for her female preventive care. Next CPE in a year.

## 2023-07-04 NOTE — Assessment & Plan Note (Signed)
Following with psychiatrist. 

## 2023-07-04 NOTE — Assessment & Plan Note (Signed)
 Last HgA1C was 6.1 in 04/2022. Consistency with a healthy life style encouraged for diabetes prevention.

## 2023-07-06 ENCOUNTER — Other Ambulatory Visit: Payer: Self-pay | Admitting: Family Medicine

## 2023-07-06 DIAGNOSIS — I10 Essential (primary) hypertension: Secondary | ICD-10-CM

## 2023-07-10 MED ORDER — METHOCARBAMOL 500 MG PO TABS
500.0000 mg | ORAL_TABLET | Freq: Two times a day (BID) | ORAL | 0 refills | Status: AC
Start: 2023-07-10 — End: 2023-08-09

## 2023-07-28 ENCOUNTER — Other Ambulatory Visit: Payer: Self-pay | Admitting: Family Medicine

## 2023-07-28 DIAGNOSIS — M542 Cervicalgia: Secondary | ICD-10-CM

## 2023-07-28 DIAGNOSIS — M792 Neuralgia and neuritis, unspecified: Secondary | ICD-10-CM

## 2023-10-10 ENCOUNTER — Telehealth: Payer: Self-pay | Admitting: Family Medicine

## 2023-10-10 ENCOUNTER — Other Ambulatory Visit: Payer: Self-pay | Admitting: Family Medicine

## 2023-10-10 DIAGNOSIS — M792 Neuralgia and neuritis, unspecified: Secondary | ICD-10-CM

## 2023-10-10 DIAGNOSIS — M542 Cervicalgia: Secondary | ICD-10-CM

## 2023-10-10 NOTE — Telephone Encounter (Signed)
 Medical Diagnosis Attestation Form -- Placed in dr. Lindi.  Please call pt upon completion: [phone]

## 2023-10-11 NOTE — Telephone Encounter (Signed)
In folder to be signed

## 2023-10-16 NOTE — Telephone Encounter (Signed)
 I left pt a voicemail letting her know that her form is complete & ready for pick up. Copy sent to scan.

## 2023-11-14 ENCOUNTER — Encounter: Payer: Self-pay | Admitting: *Deleted

## 2023-11-14 ENCOUNTER — Other Ambulatory Visit: Payer: Self-pay

## 2023-11-14 ENCOUNTER — Ambulatory Visit
Admission: EM | Admit: 2023-11-14 | Discharge: 2023-11-14 | Disposition: A | Discharge status: Home / Self Care | Attending: Physician Assistant | Admitting: Physician Assistant

## 2023-11-14 DIAGNOSIS — M722 Plantar fascial fibromatosis: Secondary | ICD-10-CM | POA: Diagnosis not present

## 2023-11-14 MED ORDER — PREDNISONE 20 MG PO TABS: 40.0000 mg | ORAL_TABLET | Freq: Every day | ORAL | 0 refills | Status: AC

## 2023-11-14 NOTE — ED Provider Notes (Signed)
 [facility]    CSN: [id number] Arrival date & time: 11/14/23  1027      History   Chief Complaint Chief Complaint  Patient presents with   Foot Pain    HPI Patricia [name] is a 53 y.o. female.   Patient presents today for evaluation of right foot pain this been ongoing for weeks.  She reports that pain is worse in the morning but seems to get better today.  Pain is noted to her sole of her foot at her heel and the arch of her foot.  She reports that she has tried Tylenol  and ice without resolution.  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  She does not report any numbness or tingling.  She denies any known injury.  The history is provided by the patient.    Past Medical History:  Diagnosis Date   Abnormal perimenopausal bleeding    Anxiety    Cervical dysplasia    Depression    GERD (gastroesophageal reflux disease)    Head trauma    Pregnancy induced hypertension    Rheumatoid arthritis (HCC)    Seizure disorder (HCC)    not in years   Shingles    STD (sexually transmitted disease)    Gonorrhea    Patient Active Problem List   Diagnosis Date Noted   Routine general medical examination at a health care facility 04/13/2022   Hyperlipidemia 04/13/2022   Prediabetes 12/13/2020   Nicotine  dependence, cigarettes, in remission 01/20/2019   Neuropathic pain of lower extremity 06/30/2018   Occipital headache 06/30/2018   Tobacco use disorder 11/04/2017   Hypertension, essential, benign 08/22/2017   Class 1 obesity with body mass index (BMI) of 31.0 to 31.9 in adult 08/22/2017   Spondylosis without myelopathy or radiculopathy, lumbar region 07/15/2017   Chronic pain disorder 04/29/2017   Lower back pain 10/15/2016   Cervicalgia 08/06/2016   Bipolar 1 disorder, depressed (HCC) 08/06/2016   Adhesive capsulitis of left shoulder 05/31/2016    Past Surgical History:  Procedure  Laterality Date   BICEPS TENDON REPAIR     CESAREAN SECTION     DILATATION & CURETTAGE/HYSTEROSCOPY WITH MYOSURE N/A 05/23/2018   Procedure: DILATATION & CURETTAGE/HYSTEROSCOPY;  Surgeon: Rockney Evalene SQUIBB, MD;  Location: [facility];  Service: Gynecology;  Laterality: N/A;   SHOULDER SURGERY     WISDOM TOOTH EXTRACTION      OB History     Gravida  4   Para  4   Term  3   Preterm  1   AB      Living  4      SAB      IAB      Ectopic      Multiple      Live Births               Home Medications    Prior to Admission medications   Medication Sig Start Date End Date Taking? Authorizing Provider  amLODipine -benazepril  (LOTREL) 5-40 MG capsule Take 1 capsule by mouth daily. 07/02/23  Yes [place], Betty G, MD  gabapentin  (NEURONTIN ) 300 MG capsule TAKE 1 CAPSULE BY MOUTH 3 TIMES  DAILY 10/11/23  Yes [place], Betty G, MD  Multiple Vitamins-Minerals (MULTIVITAMIN WITH MINERALS) tablet Take 1 tablet by mouth daily.   Yes [provider]  omeprazole  (PRILOSEC) 20 MG capsule  Take 1 capsule (20 mg total) by mouth daily. Due for physical 05/17/23  Yes [place], Betty G, MD  predniSONE (DELTASONE) 20 MG tablet Take 2 tablets (40 mg total) by mouth daily with breakfast for 5 days. 11/14/23 11/19/23 Yes Billy Asberry FALCON, PA-C  QUEtiapine (SEROQUEL) 200 MG tablet Take 1 tablet (200 mg total) by mouth at bedtime. 08/23/22  Yes Pashayan, Marsa RAMAN, DO  sertraline (ZOLOFT) 25 MG tablet Take 1 tablet (25 mg total) by mouth daily. Patient not taking: Reported on 11/14/2023 10/08/22   Marry Clamp, MD    Family History Family History  Problem Relation Age of Onset   Healthy Mother    Healthy Father    Esophageal cancer Paternal Uncle    Cancer Maternal Grandfather        Lung   Colon cancer Neg Hx    Colon polyps Neg Hx    Rectal cancer Neg Hx    Stomach cancer Neg Hx     Social History Social History   Tobacco Use   Smoking status: Former    Current  packs/day: 0.10    Average packs/day: 0.1 packs/day for 20.4 years (2.0 ttl pk-yrs)    Types: Cigarettes    Start date: 08/31/2016   Smokeless tobacco: Never   Tobacco comments:    quit 04-19-2018  Vaping Use   Vaping status: Never Used  Substance Use Topics   Alcohol use: Yes    Comment: occasionaly   Drug use: Yes    Types: Marijuana    Comment: last use 07-03-21 at 6pm     Allergies   Tramadol   Review of Systems Review of Systems  Constitutional:  Negative for chills and fever.  Eyes:  Negative for discharge and redness.  Respiratory:  Negative for shortness of breath.   Gastrointestinal:  Negative for abdominal pain, nausea and vomiting.  Neurological:  Negative for numbness.     Physical Exam Triage Vital Signs ED Triage Vitals  Encounter Vitals Group     BP      Girls Systolic BP Percentile      Girls Diastolic BP Percentile      Boys Systolic BP Percentile      Boys Diastolic BP Percentile      Pulse      Resp      Temp      Temp src      SpO2      Weight      Height      Head Circumference      Peak Flow      Pain Score      Pain Loc      Pain Education      Exclude from Growth Chart    No data found.  Updated Vital Signs BP (!) 162/98 (BP Location: Left Arm)   Pulse 62   Temp 98.1 F (36.7 C) (Oral)   Resp 16   LMP 07/30/2016 (LMP Unknown)   SpO2 98%   Visual Acuity Right Eye Distance:   Left Eye Distance:   Bilateral Distance:    Right Eye Near:   Left Eye Near:    Bilateral Near:     Physical Exam Vitals and nursing note reviewed.  Constitutional:      General: She is not in acute distress.    Appearance: Normal appearance. She is not ill-appearing.  HENT:     Head: Normocephalic and atraumatic.  Eyes:     Conjunctiva/sclera: Conjunctivae normal.  Cardiovascular:     Rate and Rhythm: Normal rate.  Pulmonary:     Effort: Pulmonary effort is normal. No respiratory distress.  Musculoskeletal:     Comments: Tenderness to  palpation noted to sole of mid right foot, specifically at heel, no swelling appreciated.  Normal range of motion of right ankle and toes  Neurological:     Mental Status: She is alert.  Psychiatric:        Mood and Affect: Mood normal.        Behavior: Behavior normal.        Thought Content: Thought content normal.      UC Treatments / Results  Labs (all labs ordered are listed, but only abnormal results are displayed) Labs Reviewed - No data to display  EKG   Radiology No results found.  Procedures Procedures (including critical care time)  Medications Ordered in UC Medications - No data to display  Initial Impression / Assessment and Plan / UC Course  I have reviewed the triage vital signs and the nursing notes.  Pertinent labs & imaging results that were available during my care of the patient were reviewed by me and considered in my medical decision making (see chart for details).    Symptoms seemingly consistent with plantar fasciitis.  Will treat with steroid burst.  Advised she use plantar fasciitis braces at night to hopefully help decrease symptoms and stretching exercises provided.  Encouraged follow-up with Ortho if symptoms do not improve.  Final Clinical Impressions(s) / UC Diagnoses   Final diagnoses:  Plantar fasciitis, right   Discharge Instructions   None    ED Prescriptions     Medication Sig Dispense Auth. Provider   predniSONE  (DELTASONE ) 20 MG tablet Take 2 tablets (40 mg total) by mouth daily with breakfast for 5 days. 10 tablet Billy Asberry FALCON, PA-C      PDMP not reviewed this encounter.   Billy Asberry FALCON, PA-C 11/14/23 1715

## 2023-11-14 NOTE — ED Triage Notes (Signed)
 Pt reports right foot pain for weeks. Pain is worse in the morning but seems to get better during the day. Pain is in the heel and arch of her foot. No injury. She works as a Child psychotherapist. She has tried tylenol  and ice. Pain is 9/10 when she gets up, 5/10 at pressent.

## 2023-12-22 ENCOUNTER — Other Ambulatory Visit: Payer: Self-pay | Admitting: Family Medicine

## 2023-12-22 DIAGNOSIS — M542 Cervicalgia: Secondary | ICD-10-CM

## 2023-12-22 DIAGNOSIS — M792 Neuralgia and neuritis, unspecified: Secondary | ICD-10-CM

## 2024-03-27 ENCOUNTER — Telehealth: Payer: Self-pay

## 2024-03-27 NOTE — Progress Notes (Signed)
" ° °  03/27/2024  Patient ID: Shona JINNY Mink, female   DOB: Dec 04, 1970, 53 y.o.   MRN: [id number]  Pharmacy Quality Measure Review  This patient is appearing on a report for being at risk of failing the Controlling Blood Pressure measure this calendar year.   Last documented BP 162/98 on 11/14/23  Attempted to contact patient. Unable to leave voicemail.  Jon VEAR Lindau, PharmD Clinical Pharmacist [phone]   "
